# Patient Record
Sex: Female | Born: 1935 | Race: White | Hispanic: No | Smoking: Never smoker
Health system: Southern US, Community
[De-identification: ages and names within clinical notes are randomized; demographics above are authoritative.]

## PROBLEM LIST (undated history)

## (undated) DIAGNOSIS — C50919 Malignant neoplasm of unspecified site of unspecified female breast: Secondary | ICD-10-CM

## (undated) DIAGNOSIS — E781 Pure hyperglyceridemia: Secondary | ICD-10-CM

## (undated) DIAGNOSIS — E559 Vitamin D deficiency, unspecified: Secondary | ICD-10-CM

## (undated) DIAGNOSIS — D709 Neutropenia, unspecified: Secondary | ICD-10-CM

## (undated) DIAGNOSIS — C4491 Basal cell carcinoma of skin, unspecified: Secondary | ICD-10-CM

## (undated) DIAGNOSIS — I10 Essential (primary) hypertension: Secondary | ICD-10-CM

## (undated) DIAGNOSIS — N959 Unspecified menopausal and perimenopausal disorder: Secondary | ICD-10-CM

## (undated) DIAGNOSIS — C801 Malignant (primary) neoplasm, unspecified: Secondary | ICD-10-CM

## (undated) DIAGNOSIS — R202 Paresthesia of skin: Secondary | ICD-10-CM

## (undated) DIAGNOSIS — B354 Tinea corporis: Secondary | ICD-10-CM

## (undated) DIAGNOSIS — G4700 Insomnia, unspecified: Secondary | ICD-10-CM

## (undated) DIAGNOSIS — G629 Polyneuropathy, unspecified: Secondary | ICD-10-CM

## (undated) DIAGNOSIS — K649 Unspecified hemorrhoids: Secondary | ICD-10-CM

## (undated) HISTORY — DX: Malignant neoplasm of unspecified site of unspecified female breast: C50.919

## (undated) HISTORY — PX: EYE SURGERY: SHX253

## (undated) HISTORY — DX: Paresthesia of skin: R20.2

## (undated) HISTORY — DX: Unspecified hemorrhoids: K64.9

## (undated) HISTORY — DX: Polyneuropathy, unspecified: G62.9

## (undated) HISTORY — DX: Unspecified menopausal and perimenopausal disorder: N95.9

## (undated) HISTORY — DX: Tinea corporis: B35.4

## (undated) HISTORY — DX: Vitamin D deficiency, unspecified: E55.9

## (undated) HISTORY — DX: Pure hyperglyceridemia: E78.1

## (undated) HISTORY — DX: Essential (primary) hypertension: I10

## (undated) HISTORY — DX: Neutropenia, unspecified: D70.9

## (undated) HISTORY — DX: Basal cell carcinoma of skin, unspecified: C44.91

## (undated) HISTORY — DX: Insomnia, unspecified: G47.00

## (undated) HISTORY — PX: ELECTROCARDIOGRAM: SHX264

---

## 1975-12-20 DIAGNOSIS — C801 Malignant (primary) neoplasm, unspecified: Secondary | ICD-10-CM

## 1975-12-20 HISTORY — DX: Malignant (primary) neoplasm, unspecified: C80.1

## 1975-12-20 HISTORY — PX: ABDOMINAL HYSTERECTOMY: SHX81

## 1975-12-20 HISTORY — PX: BREAST BIOPSY: SHX20

## 1995-01-10 DIAGNOSIS — I10 Essential (primary) hypertension: Secondary | ICD-10-CM | POA: Insufficient documentation

## 2000-05-19 DIAGNOSIS — E781 Pure hyperglyceridemia: Secondary | ICD-10-CM | POA: Insufficient documentation

## 2000-05-23 DIAGNOSIS — G4700 Insomnia, unspecified: Secondary | ICD-10-CM | POA: Insufficient documentation

## 2004-10-28 ENCOUNTER — Ambulatory Visit: Payer: Self-pay | Admitting: Family Medicine

## 2005-12-19 HISTORY — PX: COLONOSCOPY: SHX174

## 2006-01-04 ENCOUNTER — Ambulatory Visit: Payer: Self-pay | Admitting: Family Medicine

## 2006-05-08 ENCOUNTER — Ambulatory Visit: Payer: Self-pay | Admitting: Unknown Physician Specialty

## 2006-05-08 LAB — HM COLONOSCOPY

## 2007-01-25 ENCOUNTER — Ambulatory Visit: Payer: Self-pay | Admitting: Family Medicine

## 2008-06-25 ENCOUNTER — Ambulatory Visit: Payer: Self-pay | Admitting: Family Medicine

## 2009-08-21 DIAGNOSIS — R945 Abnormal results of liver function studies: Secondary | ICD-10-CM | POA: Insufficient documentation

## 2009-08-21 DIAGNOSIS — R7989 Other specified abnormal findings of blood chemistry: Secondary | ICD-10-CM | POA: Insufficient documentation

## 2009-09-09 ENCOUNTER — Ambulatory Visit: Payer: Self-pay | Admitting: Family Medicine

## 2010-09-16 ENCOUNTER — Ambulatory Visit: Payer: Self-pay | Admitting: Family Medicine

## 2010-10-06 LAB — HM DEXA SCAN: HM Dexa Scan: NORMAL

## 2011-10-25 ENCOUNTER — Ambulatory Visit: Payer: Self-pay | Admitting: Family Medicine

## 2012-11-09 ENCOUNTER — Ambulatory Visit: Payer: Self-pay | Admitting: Family Medicine

## 2013-11-11 ENCOUNTER — Ambulatory Visit: Payer: Self-pay | Admitting: Family Medicine

## 2013-12-03 ENCOUNTER — Telehealth: Payer: Self-pay | Admitting: *Deleted

## 2013-12-03 NOTE — Telephone Encounter (Signed)
Referral to Dr Allyson Sabal at [facility].  Appt with Dr. Allyson Sabal on 12/05/2013 at 900am, pt is informed of appt and to arrive 30 minutes early with medication list, insurance cards.

## 2013-12-04 ENCOUNTER — Telehealth: Payer: Self-pay | Admitting: *Deleted

## 2013-12-04 MED ORDER — DOXYCYCLINE HYCLATE 100 MG PO TABS: 100.0000 mg | ORAL_TABLET | Freq: Two times a day (BID) | ORAL | Status: DC

## 2013-12-04 NOTE — Telephone Encounter (Signed)
Pt states she needs an refill on Doxycycline.  Dr Al Corpus ordered refill as previously.

## 2013-12-04 NOTE — Telephone Encounter (Signed)
Val you may order her refill on the doxy as from previous Doc. Thank you

## 2013-12-05 ENCOUNTER — Ambulatory Visit: Payer: Self-pay | Admitting: Cardiovascular Disease

## 2014-06-23 ENCOUNTER — Ambulatory Visit: Payer: Self-pay

## 2014-11-17 ENCOUNTER — Ambulatory Visit: Payer: Self-pay | Admitting: Family Medicine

## 2015-07-06 ENCOUNTER — Ambulatory Visit (INDEPENDENT_AMBULATORY_CARE_PROVIDER_SITE_OTHER): Payer: PPO | Admitting: Family Medicine

## 2015-07-06 ENCOUNTER — Encounter: Payer: Self-pay | Admitting: Family Medicine

## 2015-07-06 VITALS — BP 136/88 | HR 80 | Temp 98.2°F | Resp 16 | Wt 181.0 lb

## 2015-07-06 DIAGNOSIS — IMO0002 Reserved for concepts with insufficient information to code with codable children: Secondary | ICD-10-CM | POA: Insufficient documentation

## 2015-07-06 DIAGNOSIS — N952 Postmenopausal atrophic vaginitis: Secondary | ICD-10-CM | POA: Insufficient documentation

## 2015-07-06 DIAGNOSIS — G64 Other disorders of peripheral nervous system: Secondary | ICD-10-CM | POA: Insufficient documentation

## 2015-07-06 DIAGNOSIS — E559 Vitamin D deficiency, unspecified: Secondary | ICD-10-CM | POA: Insufficient documentation

## 2015-07-06 DIAGNOSIS — K649 Unspecified hemorrhoids: Secondary | ICD-10-CM | POA: Diagnosis not present

## 2015-07-06 DIAGNOSIS — D709 Neutropenia, unspecified: Secondary | ICD-10-CM | POA: Insufficient documentation

## 2015-07-06 DIAGNOSIS — E669 Obesity, unspecified: Secondary | ICD-10-CM | POA: Insufficient documentation

## 2015-07-06 MED ORDER — HYDROCORTISONE ACE-PRAMOXINE 2.5-1 % EX CREA: 1.0000 | TOPICAL_CREAM | Freq: Three times a day (TID) | CUTANEOUS | Status: DC

## 2015-07-06 NOTE — Progress Notes (Signed)
Subjective:    Patient ID: Kelly Clark, female    DOB: 01/26/1929, 81 y.o.   MRN: [id number]  HPI   Hemorrhoids: Patient complains of evaluation of possible hemorrhoids. Onset of symptoms was abrupt starting 4 weeks ago ago with stable course since that time.  She describes symptoms as painful defecation. Treatment to date has been Analpram. Patient denies family hx of colorectal CA, history of previous STDs, known or suspected STD exposure, maroon colored stools, melena, receptive anal intercourse and weight loss.  .hpi  Patient Active Problem List   Diagnosis Date Noted  . Adult BMI 30+ 07/06/2015  . Hemorrhoid 07/06/2015  . Neutropenia 07/06/2015  . Disorder of peripheral nervous system 07/06/2015  . Avitaminosis D 07/06/2015  . Vaginal atrophy 07/06/2015  . Abnormal LFTs 08/21/2009  . Cannot sleep 05/23/2000  . Hypertriglyceridemia 05/19/2000  . BP (high blood pressure) 01/10/1995   Family History  Problem Relation Age of Onset  . Heart attack Mother   . Congestive Heart Failure Mother   . Stroke Father   . Colon cancer Sister   . Throat cancer Maternal Grandmother    History   Social History  . Marital Status: Widowed    Spouse Name: N/A  . Number of Children: 1  . Years of Education: H/S   Occupational History  . Retired    Social History Main Topics  . Smoking status: Never Smoker   . Smokeless tobacco: Never Used  . Alcohol Use: No  . Drug Use: No  . Sexual Activity: Not on file   Other Topics Concern  . Not on file   Social History Narrative   Past Surgical History  Procedure Laterality Date  . Abdominal hysterectomy  1977  . Breast biopsy     Not on File Previous Medications   ALPHA-LIPOIC ACID 300 MG CAPS    Take by mouth.   AMLODIPINE (NORVASC) 5 MG TABLET    Take by mouth.   CLOBETASOL OINTMENT (TEMOVATE) 0.05 %    APPLY TO AFFECTED AREAS THREE TIMES WEEKLY (MONDAY'S, WEDNESDAY'S...  (REFER TO PRESCRIPTION NOTES).   COENZYME Q10 (CO Q  10) 60 MG CAPS       DOXYCYCLINE (VIBRA-TABS) 100 MG TABLET    Take 1 tablet (100 mg total) by mouth 2 (two) times daily.   GABAPENTIN (NEURONTIN) 600 MG TABLET    Take by mouth.   HYDROCHLOROTHIAZIDE (HYDRODIURIL) 12.5 MG TABLET    Take by mouth.   LATANOPROST (XALATAN) 0.005 % OPHTHALMIC SOLUTION       OMEGA-3 FATTY ACIDS (FISH OIL) 1000 MG CAPS       POLYETHYLENE GLYCOL POWDER (MIRALAX) POWDER    Take by mouth.   VITAMIN D, ERGOCALCIFEROL, (DRISDOL) 50000 UNITS CAPS CAPSULE    Take by mouth.   BP 136/88 mmHg  Pulse 80  Temp(Src) 98.2 F (36.8 C) (Oral)  Resp 16  Wt 181 lb (82.101 kg)   Review of Systems  Constitutional: Negative.   Respiratory: Negative.   Cardiovascular: Negative.   Gastrointestinal: Negative for diarrhea, constipation, blood in stool, abdominal distention, anal bleeding and rectal pain.  Genitourinary: Negative for decreased urine volume, vaginal bleeding, vaginal discharge, enuresis, difficulty urinating, vaginal pain, menstrual problem, pelvic pain and dyspareunia.  Musculoskeletal: Negative for back pain.       Objective:   Physical Exam  Constitutional: She is oriented to person, place, and time. She appears well-developed and well-nourished.  Genitourinary:  Does have some atrophic tissue between  her vagina and rectum. Also, large hemorrhoids noted.    Neurological: She is alert and oriented to person, place, and time.  Psychiatric: She has a normal mood and affect. Her behavior is normal. Judgment and thought content normal.   BP 136/88 mmHg  Pulse 80  Temp(Src) 98.2 F (36.8 C) (Oral)  Resp 16  Wt 181 lb (82.101 kg)        Assessment & Plan:   1. Hemorrhoids, unspecified hemorrhoid type Will treat with cream below and call if worsens or does not improve.  - Pramoxine-HC (HYDROCORTISONE ACE-PRAMOXINE) 2.5-1 % CREA; Apply 1 Dose topically 3 (three) times daily.  Dispense: 28.34 g; Refill: 3  2. Vaginal atrophy  Unclear what diagnosis  dermatologist was referring to, but suspect lichen sclerosis. Was given steroid cream. Was given reassurance that with surveillance it would not advance to terminal cancer.  Patient felt reassured. Will use cream and follow up with dermatology.   Margarita Rana, MD

## 2015-08-12 ENCOUNTER — Ambulatory Visit (HOSPITAL_COMMUNITY): Admit: 2015-08-12 | Payer: Self-pay | Admitting: Gastroenterology

## 2015-08-12 ENCOUNTER — Encounter (HOSPITAL_COMMUNITY): Payer: Self-pay

## 2015-08-12 SURGERY — ERCP, WITH INTERVENTION IF INDICATED
Anesthesia: General

## 2015-09-23 ENCOUNTER — Ambulatory Visit (INDEPENDENT_AMBULATORY_CARE_PROVIDER_SITE_OTHER): Payer: PPO | Admitting: Family Medicine

## 2015-09-23 ENCOUNTER — Encounter: Payer: Self-pay | Admitting: Family Medicine

## 2015-09-23 VITALS — BP 142/80 | HR 84 | Temp 98.1°F | Resp 16 | Ht 63.0 in | Wt 180.0 lb

## 2015-09-23 DIAGNOSIS — Z23 Encounter for immunization: Secondary | ICD-10-CM | POA: Diagnosis not present

## 2015-09-23 DIAGNOSIS — I10 Essential (primary) hypertension: Secondary | ICD-10-CM | POA: Diagnosis not present

## 2015-09-23 DIAGNOSIS — Z0000 Encounter for general adult medical examination without abnormal findings: Secondary | ICD-10-CM

## 2015-09-23 DIAGNOSIS — E781 Pure hyperglyceridemia: Secondary | ICD-10-CM

## 2015-09-23 DIAGNOSIS — N952 Postmenopausal atrophic vaginitis: Secondary | ICD-10-CM | POA: Diagnosis not present

## 2015-09-23 NOTE — Patient Instructions (Addendum)
Patient advised to stop scrubbing and continue using OTC Vaseline and prescribed cream. Patient advised to call if symptoms are worsening or not improving. Patient verbalizes understanding and is in agreement with treatment plan.  Please call the [place] at [facility] to schedule this at [phone]

## 2015-09-23 NOTE — Progress Notes (Signed)
Patient ID: Kelly Clark, female   DOB: 07-Jul-1929, 82 y.o.   MRN: [id number]        Patient: Kelly Clark, Female    DOB: [birth year], 81 y.o.   MRN: [id number] Visit Date: 09/23/2015  Today's Provider: Margarita Rana, MD   Chief Complaint  Patient presents with  . Medicare Wellness   Subjective:    Annual wellness visit Kelly Clark is a 81 y.o. female. She feels well. She reports exercising 3 times a week. She reports she is sleeping well. Pt C/O vaginal discomfort.  09/19/14 CPE 11/17/14 Mammo-BI-RADS 1 05/08/06 Colon-WNL 10/06/10 BMD-Normal  Vaginitis: Patient complains of an abnormal vaginal discharge for a few weeks. Vaginal symptoms include local irritation.Vulvar symptoms include pain.STI Risk: Very low risk of STD exposureDischarge described as: none.Other associated symptoms: local irritation.    -----------------------------------------------------------   Review of Systems  Constitutional: Negative.   HENT: Negative.   Eyes: Negative.   Respiratory: Negative.   Cardiovascular: Negative.   Gastrointestinal: Negative.   Endocrine: Negative.   Genitourinary: Positive for vaginal pain.  Musculoskeletal: Negative.   Skin: Negative.   Allergic/Immunologic: Negative.   Neurological: Negative.   Hematological: Negative.   Psychiatric/Behavioral: Negative.     Social History   Social History  . Marital Status: Widowed    Spouse Name: N/A  . Number of Children: 1  . Years of Education: H/S   Occupational History  . Retired    Social History Main Topics  . Smoking status: Never Smoker   . Smokeless tobacco: Never Used  . Alcohol Use: No  . Drug Use: No  . Sexual Activity: Not on file   Other Topics Concern  . Not on file   Social History Narrative    Patient Active Problem List   Diagnosis Date Noted  . Adult BMI 30+ 07/06/2015  . Hemorrhoid 07/06/2015  . Neutropenia ([place]) 07/06/2015  . Disorder of peripheral nervous system ([place])  07/06/2015  . Avitaminosis D 07/06/2015  . Vaginal atrophy 07/06/2015  . Abnormal LFTs 08/21/2009  . Cannot sleep 05/23/2000  . Hypertriglyceridemia 05/19/2000    Past Surgical History  Procedure Laterality Date  . Abdominal hysterectomy  1977  . Breast biopsy      Her family history includes Colon cancer in her sister; Congestive Heart Failure in her mother; Heart attack in her mother; Stroke in her father; Throat cancer in her maternal grandmother.    Previous Medications   ALPHA-LIPOIC ACID 300 MG CAPS    Take by mouth.   AMLODIPINE (NORVASC) 5 MG TABLET    Take 5 mg by mouth daily.    CHOLECALCIFEROL (VITAMIN D PO)    Take 1 capsule by mouth daily.   CLOBETASOL OINTMENT (TEMOVATE) 0.05 %    APPLY TO AFFECTED AREAS THREE TIMES WEEKLY (MONDAY'S, WEDNESDAY'S...  (REFER TO PRESCRIPTION NOTES).   COENZYME Q10 (CO Q 10) 60 MG CAPS    Take 1 capsule by mouth daily.    GABAPENTIN (NEURONTIN) 600 MG TABLET    Take 600 mg by mouth at bedtime.    HYDROCHLOROTHIAZIDE (HYDRODIURIL) 12.5 MG TABLET    Take 12.5 mg by mouth daily.    LATANOPROST (XALATAN) 0.005 % OPHTHALMIC SOLUTION    Place 1 drop into both eyes at bedtime.    MAGNESIUM (MAGNACAPS PO)    Take 1 tablet by mouth daily.   OMEGA-3 FATTY ACIDS (FISH OIL) 1000 MG CAPS    Take 1 capsule by mouth daily.  POLYETHYLENE GLYCOL POWDER (MIRALAX) POWDER    Take 1 Container by mouth daily.     Patient Care Team: Margarita Rana, MD as PCP - General (Family Medicine)     Objective:   Vitals: BP 142/80 mmHg  Pulse 84  Temp(Src) 98.1 F (36.7 C) (Oral)  Resp 16  Ht 5\' 3"  (1.6 m)  Wt 180 lb (81.647 kg)  BMI 31.89 kg/m2  SpO2 98%  Physical Exam  Constitutional: She is oriented to person, place, and time. She appears well-developed and well-nourished.  HENT:  Head: Normocephalic and atraumatic.  Right Ear: Tympanic membrane, external ear and ear canal normal.  Left Ear: Tympanic membrane, external ear and ear canal normal.  Nose:  Nose normal.  Mouth/Throat: Uvula is midline, oropharynx is clear and moist and mucous membranes are normal.  Eyes: Conjunctivae, EOM and lids are normal. Pupils are equal, round, and reactive to light.  Neck: Trachea normal and normal range of motion. Neck supple. Carotid bruit is not present. No thyroid mass and no thyromegaly present.  Cardiovascular: Normal rate, regular rhythm and normal heart sounds.   Pulmonary/Chest: Effort normal and breath sounds normal.  Abdominal: Soft. Normal appearance and bowel sounds are normal. There is no hepatosplenomegaly. There is no tenderness.  Genitourinary: No breast swelling, tenderness or discharge. There is erythema in the vagina.  Musculoskeletal: Normal range of motion.  Lymphadenopathy:    She has no cervical adenopathy.    She has no axillary adenopathy.  Neurological: She is alert and oriented to person, place, and time. She has normal strength. No cranial nerve deficit.  Skin: Skin is warm, dry and intact.  Psychiatric: She has a normal mood and affect. Her speech is normal and behavior is normal. Judgment and thought content normal. Cognition and memory are normal.    Activities of Daily Living In your present state of health, do you have any difficulty performing the following activities: 09/23/2015  Hearing? N  Vision? N  Difficulty concentrating or making decisions? N  Walking or climbing stairs? N  Dressing or bathing? N  Doing errands, shopping? N    Fall Risk Assessment Fall Risk  09/23/2015  Falls in the past year? No     Depression Screen PHQ 2/9 Scores 09/23/2015  PHQ - 2 Score 0    Cognitive Testing - 6-CIT  Correct? Score   What year is it? yes 0 0 or 4  What month is it? yes 0 0 or 3  Memorize:    Kelly Clark,  44,  [address]  [place],      What time is it? (within 1 hour) yes 0 0 or 3  Count backwards from 20 yes 0 0, 2, or 4  Name the months of the year yes 0 0, 2, or 4  Repeat name & address above yes 0 0,  2, 4, 6, 8, or 10       TOTAL SCORE  0/28   Interpretation:  Normal  Normal (0-7) Abnormal (8-28)       Assessment & Plan:     Annual Wellness Visit  Reviewed patient's Family Medical History Reviewed and updated list of patient's medical providers Assessment of cognitive impairment was done Assessed patient's functional ability Established a written schedule for health screening [place] Completed and Reviewed  Exercise Activities and Dietary recommendations Goals    . Exercise 150 minutes per week (moderate activity)       Immunization History  Administered Date(s)  Administered  . Influenza, High Dose Seasonal PF 09/23/2015  . Pneumococcal Conjugate-13 08/01/2014  . Pneumococcal Polysaccharide-23 06/03/1999  . Td 01/18/2007    Health Maintenance  Topic Date Due  . ZOSTAVAX  05/05/1991  . DEXA SCAN  05/04/1996  . INFLUENZA VACCINE  07/20/2015  . TETANUS/TDAP  01/18/2017  . PNA vac Low Risk Adult  Completed       1. Medicare annual wellness visit, subsequent Stable. Patient advised to continue eating healthy and exercise daily.  2. Need for influenza vaccination - Flu vaccine HIGH DOSE PF  3. Hypertriglyceridemia - Lipid Panel With LDL/HDL Ratio  4. Essential hypertension, malignant - CBC with Differential/Platelet - Comprehensive metabolic panel - TSH  5. Vaginal atrophy New problem. Patient advised to stop scrubbing. Patient advised to continue using Clobetasol ointment along with Vaseline. Patient advised to call if symptoms are worsening or not improving. Patient verbalizes understanding and is in agreement with treatment plan.     Patient seen and examined by Dr. Jerrell Belfast, and note scribed by Philbert Riser. Dimas, CMA.  I have reviewed the document for accuracy and completeness and I agree with above. Jerrell Belfast, MD   Margarita Rana, MD      ------------------------------------------------------------------------------------------------------------

## 2015-09-26 LAB — COMPREHENSIVE METABOLIC PANEL
ALT: 24 IU/L (ref 0–32)
AST: 23 IU/L (ref 0–40)
Albumin/Globulin Ratio: 1.6 (ref 1.1–2.5)
Albumin: 4.1 g/dL (ref 3.5–4.7)
Alkaline Phosphatase: 51 IU/L (ref 39–117)
BUN/Creatinine Ratio: 11 (ref 11–26)
BUN: 12 mg/dL (ref 8–27)
Bilirubin Total: 0.5 mg/dL (ref 0.0–1.2)
CO2: 28 mmol/L (ref 18–29)
Calcium: 9.8 mg/dL (ref 8.7–10.3)
Chloride: 103 mmol/L (ref 97–108)
Creatinine, Ser: 1.05 mg/dL — ABNORMAL HIGH (ref 0.57–1.00)
GFR calc Af Amer: 56 mL/min/{1.73_m2} — ABNORMAL LOW (ref >59)
GFR calc non Af Amer: 49 mL/min/{1.73_m2} — ABNORMAL LOW (ref >59)
Globulin, Total: 2.6 g/dL (ref 1.5–4.5)
Glucose: 97 mg/dL (ref 65–99)
Potassium: 5.2 mmol/L (ref 3.5–5.2)
Sodium: 146 mmol/L — ABNORMAL HIGH (ref 134–144)
Total Protein: 6.7 g/dL (ref 6.0–8.5)

## 2015-09-26 LAB — CBC WITH DIFFERENTIAL/PLATELET
Basophils Absolute: 0 10*3/uL (ref 0.0–0.2)
Basos: 0 %
EOS (ABSOLUTE): 0.2 10*3/uL (ref 0.0–0.4)
Eos: 4 %
Hematocrit: 41.8 % (ref 34.0–46.6)
Hemoglobin: 14.2 g/dL (ref 11.1–15.9)
Immature Grans (Abs): 0 10*3/uL (ref 0.0–0.1)
Immature Granulocytes: 0 %
Lymphocytes Absolute: 1.5 10*3/uL (ref 0.7–3.1)
Lymphs: 35 %
MCH: 31.7 pg (ref 26.6–33.0)
MCHC: 34 g/dL (ref 31.5–35.7)
MCV: 93 fL (ref 79–97)
Monocytes Absolute: 0.4 10*3/uL (ref 0.1–0.9)
Monocytes: 9 %
Neutrophils Absolute: 2.2 10*3/uL (ref 1.4–7.0)
Neutrophils: 52 %
Platelets: 182 10*3/uL (ref 150–379)
RBC: 4.48 x10E6/uL (ref 3.77–5.28)
RDW: 15.1 % (ref 12.3–15.4)
WBC: 4.2 10*3/uL (ref 3.4–10.8)

## 2015-09-26 LAB — LIPID PANEL WITH LDL/HDL RATIO
Cholesterol, Total: 172 mg/dL (ref 100–199)
HDL: 45 mg/dL (ref >39)
LDL Calculated: 80 mg/dL (ref 0–99)
LDl/HDL Ratio: 1.8 ratio units (ref 0.0–3.2)
Triglycerides: 236 mg/dL — ABNORMAL HIGH (ref 0–149)
VLDL Cholesterol Cal: 47 mg/dL — ABNORMAL HIGH (ref 5–40)

## 2015-09-26 LAB — TSH: TSH: 2.92 u[IU]/mL (ref 0.450–4.500)

## 2015-09-28 ENCOUNTER — Telehealth: Payer: Self-pay

## 2015-09-28 NOTE — Telephone Encounter (Signed)
Pt advised.   Thanks,   -Laura  

## 2015-09-28 NOTE — Telephone Encounter (Signed)
-----   Message from Margarita Rana, MD sent at 09/26/2015  7:56 AM EDT ----- Labs stable. Please notify patient. Thanks.

## 2015-10-26 ENCOUNTER — Other Ambulatory Visit: Payer: Self-pay | Admitting: Family Medicine

## 2015-10-26 DIAGNOSIS — Z1231 Encounter for screening mammogram for malignant neoplasm of breast: Secondary | ICD-10-CM

## 2015-11-19 ENCOUNTER — Ambulatory Visit
Admission: RE | Admit: 2015-11-19 | Discharge: 2015-11-19 | Disposition: A | Discharge status: Home / Self Care | Payer: PPO | Source: Ambulatory Visit | Attending: Family Medicine | Admitting: Family Medicine

## 2015-11-19 DIAGNOSIS — Z1231 Encounter for screening mammogram for malignant neoplasm of breast: Secondary | ICD-10-CM | POA: Insufficient documentation

## 2016-01-01 DIAGNOSIS — L900 Lichen sclerosus et atrophicus: Secondary | ICD-10-CM | POA: Diagnosis not present

## 2016-01-27 DIAGNOSIS — H401132 Primary open-angle glaucoma, bilateral, moderate stage: Secondary | ICD-10-CM | POA: Diagnosis not present

## 2016-05-24 DIAGNOSIS — H40153 Residual stage of open-angle glaucoma, bilateral: Secondary | ICD-10-CM | POA: Diagnosis not present

## 2016-05-30 ENCOUNTER — Other Ambulatory Visit: Payer: Self-pay | Admitting: Family Medicine

## 2016-05-30 DIAGNOSIS — G64 Other disorders of peripheral nervous system: Secondary | ICD-10-CM

## 2016-05-30 NOTE — Telephone Encounter (Signed)
Has wellness scheduled with Dr. Rosanna Randy 10/05/2016. Renaldo Fiddler, CMA

## 2016-06-15 ENCOUNTER — Other Ambulatory Visit: Payer: Self-pay | Admitting: Family Medicine

## 2016-06-23 DIAGNOSIS — L821 Other seborrheic keratosis: Secondary | ICD-10-CM | POA: Diagnosis not present

## 2016-06-23 DIAGNOSIS — Z85828 Personal history of other malignant neoplasm of skin: Secondary | ICD-10-CM | POA: Diagnosis not present

## 2016-06-23 DIAGNOSIS — Z08 Encounter for follow-up examination after completed treatment for malignant neoplasm: Secondary | ICD-10-CM | POA: Diagnosis not present

## 2016-06-23 DIAGNOSIS — L900 Lichen sclerosus et atrophicus: Secondary | ICD-10-CM | POA: Diagnosis not present

## 2016-09-12 DIAGNOSIS — S99922A Unspecified injury of left foot, initial encounter: Secondary | ICD-10-CM | POA: Diagnosis not present

## 2016-09-12 DIAGNOSIS — S92352A Displaced fracture of fifth metatarsal bone, left foot, initial encounter for closed fracture: Secondary | ICD-10-CM | POA: Diagnosis not present

## 2016-09-12 DIAGNOSIS — S92302A Fracture of unspecified metatarsal bone(s), left foot, initial encounter for closed fracture: Secondary | ICD-10-CM | POA: Diagnosis not present

## 2016-09-20 DIAGNOSIS — H40153 Residual stage of open-angle glaucoma, bilateral: Secondary | ICD-10-CM | POA: Diagnosis not present

## 2016-09-21 DIAGNOSIS — S92342A Displaced fracture of fourth metatarsal bone, left foot, initial encounter for closed fracture: Secondary | ICD-10-CM | POA: Diagnosis not present

## 2016-09-21 DIAGNOSIS — M79672 Pain in left foot: Secondary | ICD-10-CM | POA: Diagnosis not present

## 2016-09-21 DIAGNOSIS — S92352A Displaced fracture of fifth metatarsal bone, left foot, initial encounter for closed fracture: Secondary | ICD-10-CM | POA: Diagnosis not present

## 2016-09-23 ENCOUNTER — Encounter: Payer: PPO | Admitting: Family Medicine

## 2016-10-05 ENCOUNTER — Encounter: Payer: Self-pay | Admitting: Family Medicine

## 2016-10-05 ENCOUNTER — Other Ambulatory Visit: Payer: Self-pay | Admitting: Family Medicine

## 2016-10-05 ENCOUNTER — Ambulatory Visit (INDEPENDENT_AMBULATORY_CARE_PROVIDER_SITE_OTHER): Payer: PPO | Admitting: Family Medicine

## 2016-10-05 VITALS — BP 112/76 | HR 80 | Temp 98.5°F | Resp 14 | Ht 63.0 in | Wt 180.0 lb

## 2016-10-05 DIAGNOSIS — R739 Hyperglycemia, unspecified: Secondary | ICD-10-CM

## 2016-10-05 DIAGNOSIS — I10 Essential (primary) hypertension: Secondary | ICD-10-CM

## 2016-10-05 DIAGNOSIS — Z1231 Encounter for screening mammogram for malignant neoplasm of breast: Secondary | ICD-10-CM

## 2016-10-05 DIAGNOSIS — Z23 Encounter for immunization: Secondary | ICD-10-CM | POA: Diagnosis not present

## 2016-10-05 DIAGNOSIS — R42 Dizziness and giddiness: Secondary | ICD-10-CM | POA: Diagnosis not present

## 2016-10-05 DIAGNOSIS — Z0000 Encounter for general adult medical examination without abnormal findings: Secondary | ICD-10-CM

## 2016-10-05 NOTE — Progress Notes (Addendum)
Patient: Kelly Clark, Female    DOB: 05-Oct-1929, 81 y.o.   MRN: [id number] Visit Date: 10/05/2016  Today's Provider: Wilhemena Durie, MD   Chief Complaint  Patient presents with  . Medicare Wellness   Subjective:   Kelly Clark is a 82 y.o. female who presents today for her Subsequent Annual Wellness Visit. She feels well. She reports exercising not right now. She reports she is sleeping well. Immunization History  Administered Date(s) Administered  . Influenza, High Dose Seasonal PF 09/23/2015  . Pneumococcal Conjugate-13 08/01/2014  . Pneumococcal Polysaccharide-23 06/03/1999  . Td 01/18/2007   Last Mammogram 11/19/15  BMD 10/06/10 normal  Colonoscopy 05/08/06  Review of Systems  Constitutional: Negative.   HENT: Negative.   Eyes: Negative.   Respiratory: Negative.   Cardiovascular: Negative.   Gastrointestinal: Negative.   Endocrine: Negative.   Genitourinary: Negative.   Musculoskeletal: Positive for arthralgias and gait problem.  Skin: Negative.   Allergic/Immunologic: Negative.   Neurological: Positive for dizziness and light-headedness.  Hematological: Negative.   Psychiatric/Behavioral: Negative.     Patient Active Problem List   Diagnosis Date Noted  . Adult BMI 30+ 07/06/2015  . Hemorrhoid 07/06/2015  . Neutropenia ([place]) 07/06/2015  . Disorder of peripheral nervous system ([place]) 07/06/2015  . Avitaminosis D 07/06/2015  . Vaginal atrophy 07/06/2015  . Abnormal LFTs 08/21/2009  . Cannot sleep 05/23/2000  . Hypertriglyceridemia 05/19/2000    Social History   Social History  . Marital status: Widowed    Spouse name: N/A  . Number of children: 1  . Years of education: H/S   Occupational History  . Retired    Social History Main Topics  . Smoking status: Never Smoker  . Smokeless tobacco: Never Used  . Alcohol use No  . Drug use: No  . Sexual activity: Not on file   Other Topics Concern  . Not on file   Social History Narrative  . No  narrative on file    Past Surgical History:  Procedure Laterality Date  . ABDOMINAL HYSTERECTOMY  1977  . BREAST BIOPSY Bilateral    benign    Her family history includes Colon cancer in her sister; Congestive Heart Failure in her mother; Heart attack in her mother; Stroke in her father; Throat cancer in her maternal grandmother.    Outpatient Encounter Prescriptions as of 10/05/2016  Medication Sig Note  . amLODipine (NORVASC) 5 MG tablet take 1 tablet by mouth once daily   . Cholecalciferol (VITAMIN D PO) Take 1 capsule by mouth daily.   . clobetasol ointment (TEMOVATE) 0.05 % APPLY TO AFFECTED AREAS THREE TIMES WEEKLY (MONDAY'S, WEDNESDAY'S...  (REFER TO PRESCRIPTION NOTES). 07/06/2015: Received from: External Pharmacy  . Coenzyme Q10 (CO Q 10) 60 MG CAPS Take 1 capsule by mouth daily.  07/06/2015: DX: 272.1 Received from: [facility]  . gabapentin (NEURONTIN) 600 MG tablet take 1 tablet by mouth at bedtime   . hydrochlorothiazide (HYDRODIURIL) 12.5 MG tablet take 1 tablet by mouth once daily   . latanoprost (XALATAN) 0.005 % ophthalmic solution Place 1 drop into both eyes at bedtime.  07/06/2015: Received from: External Pharmacy  . Multiple Vitamin (MULTIVITAMIN) tablet Take 1 tablet by mouth daily.   . Omega-3 Fatty Acids (FISH OIL) 1000 MG CAPS Take 1 capsule by mouth daily.  07/06/2015: DX: 272.1 Received from: [facility]  . polyethylene glycol powder (MIRALAX) powder Take 1 Container by mouth daily.  07/06/2015: DX: 564.00 Received from: [facility]  . [DISCONTINUED] Alpha-Lipoic Acid 300 MG CAPS Take by mouth. 07/06/2015: Received from: [facility]  . [DISCONTINUED] gabapentin (NEURONTIN) 600 MG tablet take 1 tablet by mouth at bedtime   . [DISCONTINUED] Magnesium (MAGNACAPS PO) Take 1 tablet by mouth daily.    No facility-administered encounter medications on file as of 10/05/2016.     No Known  Allergies  Patient Care Team: [name]., MD as PCP - General (Family Medicine)  Objective:   Vitals:  Vitals:   10/05/16 1045  BP: 112/76  Pulse: 80  Resp: 14  Temp: 98.5 F (36.9 C)  Weight: 180 lb (81.6 kg)  Height: 5\' 3"  (1.6 m)    Physical Exam  Constitutional: She is oriented to person, place, and time. She appears well-developed and well-nourished.  HENT:  Head: Normocephalic and atraumatic.  Right Ear: External ear normal.  Left Ear: External ear normal.  Nose: Nose normal.  Mouth/Throat: Oropharynx is clear and moist.  Small right thyroid nodule.  Eyes: Conjunctivae are normal. Pupils are equal, round, and reactive to light.  Neck: Normal range of motion. Neck supple.  Cardiovascular: Normal rate, regular rhythm, normal heart sounds and intact distal pulses.   No murmur heard. Pulmonary/Chest: Effort normal and breath sounds normal. No respiratory distress. She has no wheezes.  Abdominal: Soft. She exhibits no distension. There is no tenderness. There is no rebound.  Musculoskeletal:  Wears a boot on left foot post fractures  Neurological: She is alert and oriented to person, place, and time. No cranial nerve deficit. Coordination normal.  Skin: Skin is warm and dry.  Psychiatric: She has a normal mood and affect. Her behavior is normal. Judgment and thought content normal.    Activities of Daily Living In your present state of health, do you have any difficulty performing the following activities: 10/05/2016  Hearing? N  Vision? N  Difficulty concentrating or making decisions? N  Walking or climbing stairs? N  Dressing or bathing? N  Doing errands, shopping? N  Some recent data might be hidden    Fall Risk Assessment Fall Risk  10/05/2016 09/23/2015  Falls in the past year? Yes No  Number falls in past yr: 1 -  Injury with Fall? Yes -     Depression Screen PHQ 2/9 Scores 10/05/2016 09/23/2015  PHQ - 2 Score 0 0    Cognitive Testing -  6-CIT    Year: 0 4 points  Month: 0 3 points  Memorize "[name], [address] [place]"  Time (within 1 hour:) 0 3 points  Count backwards from 20: 0 2 4 points  Name months of year: 0 2 4 points  Repeat Address: 0 2 4 6 8 10  points   Total Score: 0/28  Interpretation : Normal (0-7) Abnormal (8-28)    Assessment & Plan:     Annual Wellness Visit  Reviewed patient's Family Medical History Reviewed and updated list of patient's medical providers Assessment of cognitive impairment was done Assessed patient's functional ability Established a written schedule for health screening [place] Completed and Reviewed  1. Medicare annual wellness visit, subsequent  2. Need for influenza vaccination - Flu vaccine HIGH DOSE PF (Fluzone High dose)  3. Lightheadedness EKG stable. Will check labs for underling issues. Pending results.Vague symptoms and very nonspecific complaint. Mild orthostasis very likely. Encourage patient to increase fluids. - EKG 12-Lead - CBC w/Diff/Platelet  4. Essential hypertension, malignant Stable. - CBC w/Diff/Platelet - TSH -  Comprehensive metabolic panel  5. Hyperglycemia Glucose level elevated on the last check.  - HgB A1c 6. Small thyroid nodule Follow clinically for now. Consider endocrine or ultrasound. HPI, Exam and A&P transcribed under direction and in the presence of Miguel Aschoff, MD. I have done the exam and reviewed the chart and it is accurate to the best of my knowledge. Miguel Aschoff M.D. [facility]

## 2016-10-06 DIAGNOSIS — M79672 Pain in left foot: Secondary | ICD-10-CM | POA: Diagnosis not present

## 2016-10-06 DIAGNOSIS — S92342D Displaced fracture of fourth metatarsal bone, left foot, subsequent encounter for fracture with routine healing: Secondary | ICD-10-CM | POA: Diagnosis not present

## 2016-10-06 DIAGNOSIS — S92352D Displaced fracture of fifth metatarsal bone, left foot, subsequent encounter for fracture with routine healing: Secondary | ICD-10-CM | POA: Diagnosis not present

## 2016-10-06 LAB — COMPREHENSIVE METABOLIC PANEL
ALT: 18 IU/L (ref 0–32)
AST: 18 IU/L (ref 0–40)
Albumin/Globulin Ratio: 1.9 (ref 1.2–2.2)
Albumin: 4.3 g/dL (ref 3.5–4.7)
Alkaline Phosphatase: 56 IU/L (ref 39–117)
BUN/Creatinine Ratio: 15 (ref 12–28)
BUN: 16 mg/dL (ref 8–27)
Bilirubin Total: 0.4 mg/dL (ref 0.0–1.2)
CO2: 29 mmol/L (ref 18–29)
Calcium: 9.7 mg/dL (ref 8.7–10.3)
Chloride: 97 mmol/L (ref 96–106)
Creatinine, Ser: 1.07 mg/dL — ABNORMAL HIGH (ref 0.57–1.00)
GFR calc Af Amer: 55 mL/min/{1.73_m2} — ABNORMAL LOW (ref >59)
GFR calc non Af Amer: 47 mL/min/{1.73_m2} — ABNORMAL LOW (ref >59)
Globulin, Total: 2.3 g/dL (ref 1.5–4.5)
Glucose: 81 mg/dL (ref 65–99)
Potassium: 4.1 mmol/L (ref 3.5–5.2)
Sodium: 141 mmol/L (ref 134–144)
Total Protein: 6.6 g/dL (ref 6.0–8.5)

## 2016-10-06 LAB — CBC WITH DIFFERENTIAL/PLATELET
Basophils Absolute: 0 10*3/uL (ref 0.0–0.2)
Basos: 0 %
EOS (ABSOLUTE): 0.2 10*3/uL (ref 0.0–0.4)
Eos: 3 %
Hematocrit: 41.4 % (ref 34.0–46.6)
Hemoglobin: 13.7 g/dL (ref 11.1–15.9)
Immature Grans (Abs): 0 10*3/uL (ref 0.0–0.1)
Immature Granulocytes: 0 %
Lymphocytes Absolute: 1.6 10*3/uL (ref 0.7–3.1)
Lymphs: 26 %
MCH: 31.3 pg (ref 26.6–33.0)
MCHC: 33.1 g/dL (ref 31.5–35.7)
MCV: 95 fL (ref 79–97)
Monocytes Absolute: 0.5 10*3/uL (ref 0.1–0.9)
Monocytes: 9 %
Neutrophils Absolute: 3.9 10*3/uL (ref 1.4–7.0)
Neutrophils: 62 %
Platelets: 188 10*3/uL (ref 150–379)
RBC: 4.38 x10E6/uL (ref 3.77–5.28)
RDW: 15.2 % (ref 12.3–15.4)
WBC: 6.3 10*3/uL (ref 3.4–10.8)

## 2016-10-06 LAB — HEMOGLOBIN A1C
Est. average glucose Bld gHb Est-mCnc: 111 mg/dL
Hgb A1c MFr Bld: 5.5 % (ref 4.8–5.6)

## 2016-10-06 LAB — TSH: TSH: 1.76 u[IU]/mL (ref 0.450–4.500)

## 2016-10-26 DIAGNOSIS — S92352D Displaced fracture of fifth metatarsal bone, left foot, subsequent encounter for fracture with routine healing: Secondary | ICD-10-CM | POA: Diagnosis not present

## 2016-10-26 DIAGNOSIS — S92342D Displaced fracture of fourth metatarsal bone, left foot, subsequent encounter for fracture with routine healing: Secondary | ICD-10-CM | POA: Diagnosis not present

## 2016-11-02 DIAGNOSIS — H8111 Benign paroxysmal vertigo, right ear: Secondary | ICD-10-CM | POA: Diagnosis not present

## 2016-11-02 DIAGNOSIS — R42 Dizziness and giddiness: Secondary | ICD-10-CM | POA: Diagnosis not present

## 2016-11-02 DIAGNOSIS — H6121 Impacted cerumen, right ear: Secondary | ICD-10-CM | POA: Diagnosis not present

## 2016-11-22 ENCOUNTER — Ambulatory Visit
Admission: RE | Admit: 2016-11-22 | Discharge: 2016-11-22 | Disposition: A | Discharge status: Home / Self Care | Payer: PPO | Source: Ambulatory Visit | Attending: Family Medicine | Admitting: Family Medicine

## 2016-11-22 DIAGNOSIS — Z1231 Encounter for screening mammogram for malignant neoplasm of breast: Secondary | ICD-10-CM | POA: Diagnosis not present

## 2016-11-22 HISTORY — DX: Malignant (primary) neoplasm, unspecified: C80.1

## 2016-11-29 DIAGNOSIS — S92342D Displaced fracture of fourth metatarsal bone, left foot, subsequent encounter for fracture with routine healing: Secondary | ICD-10-CM | POA: Diagnosis not present

## 2016-11-29 DIAGNOSIS — S92352D Displaced fracture of fifth metatarsal bone, left foot, subsequent encounter for fracture with routine healing: Secondary | ICD-10-CM | POA: Diagnosis not present

## 2016-12-28 DIAGNOSIS — L900 Lichen sclerosus et atrophicus: Secondary | ICD-10-CM | POA: Diagnosis not present

## 2017-01-30 DIAGNOSIS — H40153 Residual stage of open-angle glaucoma, bilateral: Secondary | ICD-10-CM | POA: Diagnosis not present

## 2017-03-22 ENCOUNTER — Other Ambulatory Visit: Payer: Self-pay | Admitting: Family Medicine

## 2017-03-22 NOTE — Telephone Encounter (Signed)
Last filled 06/15/2016 qty 90 with 1 refill

## 2017-03-22 NOTE — Telephone Encounter (Signed)
[facility] faxed a request for the following medications to be filled for pt. 90-day supply. Thanks CC   amLODipine (NORVASC) 5 MG  Take 1 tablet by mouth once daily Qty 90  Hydrochlorothiazide 12.5 MG Take 1 tablet by mouth once daily Qty 90

## 2017-03-23 MED ORDER — AMLODIPINE BESYLATE 5 MG PO TABS: 5.0000 mg | ORAL_TABLET | Freq: Every day | ORAL | 1 refills | Status: DC

## 2017-03-23 MED ORDER — HYDROCHLOROTHIAZIDE 12.5 MG PO TABS: 12.5000 mg | ORAL_TABLET | Freq: Every day | ORAL | 1 refills | Status: DC

## 2017-04-26 DIAGNOSIS — H8112 Benign paroxysmal vertigo, left ear: Secondary | ICD-10-CM | POA: Diagnosis not present

## 2017-05-30 DIAGNOSIS — H40153 Residual stage of open-angle glaucoma, bilateral: Secondary | ICD-10-CM | POA: Diagnosis not present

## 2017-06-13 DIAGNOSIS — H26491 Other secondary cataract, right eye: Secondary | ICD-10-CM | POA: Diagnosis not present

## 2017-06-13 DIAGNOSIS — Z961 Presence of intraocular lens: Secondary | ICD-10-CM | POA: Diagnosis not present

## 2017-06-13 DIAGNOSIS — H18413 Arcus senilis, bilateral: Secondary | ICD-10-CM | POA: Diagnosis not present

## 2017-06-13 DIAGNOSIS — H26493 Other secondary cataract, bilateral: Secondary | ICD-10-CM | POA: Diagnosis not present

## 2017-06-28 DIAGNOSIS — L821 Other seborrheic keratosis: Secondary | ICD-10-CM | POA: Diagnosis not present

## 2017-06-28 DIAGNOSIS — L900 Lichen sclerosus et atrophicus: Secondary | ICD-10-CM | POA: Diagnosis not present

## 2017-06-28 DIAGNOSIS — Z08 Encounter for follow-up examination after completed treatment for malignant neoplasm: Secondary | ICD-10-CM | POA: Diagnosis not present

## 2017-06-28 DIAGNOSIS — Z85828 Personal history of other malignant neoplasm of skin: Secondary | ICD-10-CM | POA: Diagnosis not present

## 2017-06-30 ENCOUNTER — Other Ambulatory Visit: Payer: Self-pay | Admitting: Family Medicine

## 2017-06-30 MED ORDER — GABAPENTIN 600 MG PO TABS: 600.0000 mg | ORAL_TABLET | Freq: Every day | ORAL | 1 refills | Status: DC

## 2017-06-30 NOTE — Telephone Encounter (Signed)
[facility] faxed a request on the following medication. Thanks CC  gabapentin (NEURONTIN) 600 MG tablet  >Take 1 tablet by mouth at bedtime.

## 2017-07-03 ENCOUNTER — Telehealth: Payer: Self-pay | Admitting: Family Medicine

## 2017-07-06 ENCOUNTER — Ambulatory Visit (INDEPENDENT_AMBULATORY_CARE_PROVIDER_SITE_OTHER): Payer: PPO

## 2017-07-06 VITALS — BP 144/76 | HR 80 | Temp 98.9°F | Ht 62.5 in | Wt 185.0 lb

## 2017-07-06 DIAGNOSIS — Z0000 Encounter for general adult medical examination without abnormal findings: Secondary | ICD-10-CM | POA: Diagnosis not present

## 2017-07-06 NOTE — Progress Notes (Signed)
Subjective:   Kelly Clark is a 83 y.o. female who presents for Medicare Annual (Subsequent) preventive examination.  Review of Systems:  N/A  Cardiac Risk Factors include: advanced age (>91men, >81 women);hypertension;obesity (BMI >30kg/m2)     Objective:     Vitals: BP (!) 144/76 (BP Location: Left Arm)   Pulse 80   Temp 98.9 F (37.2 C) (Oral)   Ht 5' 2.5" (1.588 m)   Wt 185 lb (83.9 kg)   BMI 33.30 kg/m   Body mass index is 33.3 kg/m.   Tobacco History  Smoking Status  . Never Smoker  Smokeless Tobacco  . Never Used     Counseling given: Not Answered   Past Medical History:  Diagnosis Date  . Cancer ([place])    melanoma   Past Surgical History:  Procedure Laterality Date  . ABDOMINAL HYSTERECTOMY  1977  . BREAST BIOPSY Bilateral 1977   benign   Family History  Problem Relation Age of Onset  . Heart attack Mother   . Congestive Heart Failure Mother   . Stroke Father   . Colon cancer Sister   . Throat cancer Maternal Grandmother   . Healthy Daughter    History  Sexual Activity  . Sexual activity: Not on file    Outpatient Encounter Prescriptions as of 07/06/2017  Medication Sig  . amLODipine (NORVASC) 5 MG tablet Take 1 tablet (5 mg total) by mouth daily.  . Cholecalciferol (VITAMIN D PO) Take 1 capsule by mouth daily.  . clobetasol ointment (TEMOVATE) 0.05 % APPLY TO AFFECTED AREAS ONCE WEEKLY  . Coenzyme Q10 (CO Q 10) 60 MG CAPS Take 1 capsule by mouth daily.   Marland Kitchen gabapentin (NEURONTIN) 600 MG tablet Take 1 tablet (600 mg total) by mouth at bedtime.  . hydrochlorothiazide (HYDRODIURIL) 12.5 MG tablet Take 1 tablet (12.5 mg total) by mouth daily.  Marland Kitchen latanoprost (XALATAN) 0.005 % ophthalmic solution Place 1 drop into both eyes at bedtime.   . Multiple Vitamin (MULTIVITAMIN) tablet Take 1 tablet by mouth daily.  . multivitamin-lutein (OCUVITE-LUTEIN) CAPS capsule Take 1 capsule by mouth daily.  . polyethylene glycol powder (MIRALAX) powder Take 1  Container by mouth daily.   . [DISCONTINUED] Omega-3 Fatty Acids (FISH OIL) 1000 MG CAPS Take 1 capsule by mouth daily.    No facility-administered encounter medications on file as of 07/06/2017.     Activities of Daily Living In your present state of health, do you have any difficulty performing the following activities: 07/06/2017 10/05/2016  Hearing? N N  Vision? N N  Difficulty concentrating or making decisions? N N  Walking or climbing stairs? N N  Dressing or bathing? N N  Doing errands, shopping? N N  Preparing Food and eating ? N -  Using the Toilet? N -  In the past six months, have you accidently leaked urine? Y -  Do you have problems with loss of bowel control? N -  Managing your Medications? N -  Managing your Finances? N -  Housekeeping or managing your Housekeeping? N -  Some recent data might be hidden    Patient Care Team: [name]., MD as PCP - General (Family Medicine) Lorelee Cover., MD as Consulting Physician (Ophthalmology) Oneta Rack, MD as Consulting Physician (Dermatology)    Assessment:     Exercise Activities and Dietary recommendations Current Exercise Habits: Structured exercise class, Type of exercise: stretching;strength training/weights;Other - see comments (cardio), Time (Minutes): 60, Frequency (Times/Week): 3,  Weekly Exercise (Minutes/Week): 180, Intensity: Mild, Exercise limited by: None identified  Goals    . Exercise 150 minutes per week (moderate activity)    . Increase water intake          Recommend increasing water intake to 4-6 glasses a day.       Fall Risk Fall Risk  07/06/2017 10/05/2016 09/23/2015  Falls in the past year? No Yes No  Number falls in past yr: - 1 -  Injury with Fall? - Yes -   Depression Screen PHQ 2/9 Scores 07/06/2017 10/05/2016 09/23/2015  PHQ - 2 Score 0 0 0     Cognitive Function     6CIT Screen 07/06/2017 10/05/2016  What Year? 0 points 0 points  What month? 0 points 0 points    What time? 0 points 0 points  Count back from 20 0 points 0 points  Months in reverse 0 points 0 points  Repeat phrase 0 points 0 points  Total Score 0 0    Immunization History  Administered Date(s) Administered  . Influenza, High Dose Seasonal PF 09/23/2015, 10/05/2016  . Pneumococcal Conjugate-13 08/01/2014  . Pneumococcal Polysaccharide-23 06/03/1999  . Td 01/18/2007   Screening Tests Health Maintenance  Topic Date Due  . TETANUS/TDAP  12/19/2026 (Originally 01/18/2017)  . INFLUENZA VACCINE  07/19/2017  . DEXA SCAN  Completed  . PNA vac Low Risk Adult  Completed      Plan:  I have personally reviewed and addressed the Medicare Annual Wellness questionnaire and have noted the following in the patient's chart:  A. Medical and social history B. Use of alcohol, tobacco or illicit drugs  C. Current medications and supplements D. Functional ability and status E.  Nutritional status F.  Physical activity G. Advance directives H. List of other physicians I.  Hospitalizations, surgeries, and ER visits in previous 12 months J.  [place] such as hearing and vision if needed, cognitive and depression L. Referrals and appointments - none  In addition, I have reviewed and discussed with patient certain preventive protocols, quality metrics, and best practice recommendations. A written personalized care plan for preventive services as well as general preventive health recommendations were provided to patient.  See attached scanned questionnaire for additional information.   Signed,  Fabio Neighbors, LPN Nurse Health Advisor   MD Recommendations: None, declined tetanus vaccine today.

## 2017-07-06 NOTE — Patient Instructions (Signed)
Kelly Clark , Thank you for taking time to come for your Medicare Wellness Visit. I appreciate your ongoing commitment to your health goals. Please review the following plan we discussed and let me know if I can assist you in the future.   Screening recommendations/referrals: Colonoscopy: completed 05/08/06 Mammogram: completed 11/22/16 Bone Density: completed 10/06/10 Recommended yearly ophthalmology/optometry visit for glaucoma screening and checkup Recommended yearly dental visit for hygiene and checkup  Vaccinations: Influenza vaccine: due 08/2017 Pneumococcal vaccine: completed series Tdap vaccine: declined Shingles vaccine: declined  Advanced directives: Already on file.  Conditions/risks identified: Recommend increasing water intake to 4-6 glasses a day.   Next appointment: 10/10/17 @ 10:00 AM   Preventive Care 65 Years and Older, Female Preventive care refers to lifestyle choices and visits with your health care provider that can promote health and wellness. What does preventive care include?  A yearly physical exam. This is also called an annual well check.  Dental exams once or twice a year.  Routine eye exams. Ask your health care provider how often you should have your eyes checked.  Personal lifestyle choices, including:  Daily care of your teeth and gums.  Regular physical activity.  Eating a healthy diet.  Avoiding tobacco and drug use.  Limiting alcohol use.  Practicing safe sex.  Taking low-dose aspirin every day.  Taking vitamin and mineral supplements as recommended by your health care provider. What happens during an annual well check? The services and screenings done by your health care provider during your annual well check will depend on your age, overall health, lifestyle risk factors, and family history of disease. Counseling  Your health care provider may ask you questions about your:  Alcohol use.  Tobacco use.  Drug  use.  Emotional well-being.  Home and relationship well-being.  Sexual activity.  Eating habits.  History of falls.  Memory and ability to understand (cognition).  Work and work Statistician.  Reproductive health. Screening  You may have the following tests or measurements:  Height, weight, and BMI.  Blood pressure.  Lipid and cholesterol levels. These may be checked every 5 years, or more frequently if you are over 40 years old.  Skin check.  Lung cancer screening. You may have this screening every year starting at age 86 if you have a 30-pack-year history of smoking and currently smoke or have quit within the past 15 years.  Fecal occult blood test (FOBT) of the stool. You may have this test every year starting at age 33.  Flexible sigmoidoscopy or colonoscopy. You may have a sigmoidoscopy every 5 years or a colonoscopy every 10 years starting at age 67.  Hepatitis C blood test.  Hepatitis B blood test.  Sexually transmitted disease (STD) testing.  Diabetes screening. This is done by checking your blood sugar (glucose) after you have not eaten for a while (fasting). You may have this done every 1-3 years.  Bone density scan. This is done to screen for osteoporosis. You may have this done starting at age 62.  Mammogram. This may be done every 1-2 years. Talk to your health care provider about how often you should have regular mammograms. Talk with your health care provider about your test results, treatment options, and if necessary, the need for more tests. Vaccines  Your health care provider may recommend certain vaccines, such as:  Influenza vaccine. This is recommended every year.  Tetanus, diphtheria, and acellular pertussis (Tdap, Td) vaccine. You may need a Td booster every 10 years.  Zoster vaccine. You may need this after age 5.  Pneumococcal 13-valent conjugate (PCV13) vaccine. One dose is recommended after age 62.  Pneumococcal polysaccharide  (PPSV23) vaccine. One dose is recommended after age 25. Talk to your health care provider about which screenings and vaccines you need and how often you need them. This information is not intended to replace advice given to you by your health care provider. Make sure you discuss any questions you have with your health care provider. Document Released: 01/01/2016 Document Revised: 08/24/2016 Document Reviewed: 10/06/2015 Elsevier Interactive Patient Education  2017 [place] Prevention in the Home Falls can cause injuries. They can happen to people of all ages. There are many things you can do to make your home safe and to help prevent falls. What can I do on the outside of my home?  Regularly fix the edges of walkways and driveways and fix any cracks.  Remove anything that might make you trip as you walk through a door, such as a raised step or threshold.  Trim any bushes or trees on the path to your home.  Use bright outdoor lighting.  Clear any walking paths of anything that might make someone trip, such as rocks or tools.  Regularly check to see if handrails are loose or broken. Make sure that both sides of any steps have handrails.  Any raised decks and porches should have guardrails on the edges.  Have any leaves, snow, or ice cleared regularly.  Use sand or salt on walking paths during winter.  Clean up any spills in your garage right away. This includes oil or grease spills. What can I do in the bathroom?  Use night lights.  Install grab bars by the toilet and in the tub and shower. Do not use towel bars as grab bars.  Use non-skid mats or decals in the tub or shower.  If you need to sit down in the shower, use a plastic, non-slip stool.  Keep the floor dry. Clean up any water that spills on the floor as soon as it happens.  Remove soap buildup in the tub or shower regularly.  Attach bath mats securely with double-sided non-slip rug tape.  Do not have  throw rugs and other things on the floor that can make you trip. What can I do in the bedroom?  Use night lights.  Make sure that you have a light by your bed that is easy to reach.  Do not use any sheets or blankets that are too big for your bed. They should not hang down onto the floor.  Have a firm chair that has side arms. You can use this for support while you get dressed.  Do not have throw rugs and other things on the floor that can make you trip. What can I do in the kitchen?  Clean up any spills right away.  Avoid walking on wet floors.  Keep items that you use a lot in easy-to-reach places.  If you need to reach something above you, use a strong step stool that has a grab bar.  Keep electrical cords out of the way.  Do not use floor polish or wax that makes floors slippery. If you must use wax, use non-skid floor wax.  Do not have throw rugs and other things on the floor that can make you trip. What can I do with my stairs?  Do not leave any items on the stairs.  Make sure that there are  handrails on both sides of the stairs and use them. Fix handrails that are broken or loose. Make sure that handrails are as long as the stairways.  Check any carpeting to make sure that it is firmly attached to the stairs. Fix any carpet that is loose or worn.  Avoid having throw rugs at the top or bottom of the stairs. If you do have throw rugs, attach them to the floor with carpet tape.  Make sure that you have a light switch at the top of the stairs and the bottom of the stairs. If you do not have them, ask someone to add them for you. What else can I do to help prevent falls?  Wear shoes that:  Do not have high heels.  Have rubber bottoms.  Are comfortable and fit you well.  Are closed at the toe. Do not wear sandals.  If you use a stepladder:  Make sure that it is fully opened. Do not climb a closed stepladder.  Make sure that both sides of the stepladder are  locked into place.  Ask someone to hold it for you, if possible.  Clearly mark and make sure that you can see:  Any grab bars or handrails.  First and last steps.  Where the edge of each step is.  Use tools that help you move around (mobility aids) if they are needed. These include:  Canes.  Walkers.  Scooters.  Crutches.  Turn on the lights when you go into a dark area. Replace any light bulbs as soon as they burn out.  Set up your furniture so you have a clear path. Avoid moving your furniture around.  If any of your floors are uneven, fix them.  If there are any pets around you, be aware of where they are.  Review your medicines with your doctor. Some medicines can make you feel dizzy. This can increase your chance of falling. Ask your doctor what other things that you can do to help prevent falls. This information is not intended to replace advice given to you by your health care provider. Make sure you discuss any questions you have with your health care provider. Document Released: 10/01/2009 Document Revised: 05/12/2016 Document Reviewed: 01/09/2015 Elsevier Interactive Patient Education  2017 [employer].

## 2017-08-22 LAB — LIPID PANEL
Cholesterol: 166 (ref 0–200)
HDL: 48 (ref 35–70)
LDL Cholesterol: 94
LDl/HDL Ratio: 3.5
Triglycerides: 121 (ref 40–160)

## 2017-08-22 LAB — BASIC METABOLIC PANEL
Creatinine: 0.8 (ref 0.5–1.1)
Glucose: 128

## 2017-08-22 LAB — HEMOGLOBIN A1C: Hemoglobin A1C: 5.4

## 2017-08-23 DIAGNOSIS — L02419 Cutaneous abscess of limb, unspecified: Secondary | ICD-10-CM | POA: Diagnosis not present

## 2017-08-25 DIAGNOSIS — L02419 Cutaneous abscess of limb, unspecified: Secondary | ICD-10-CM | POA: Diagnosis not present

## 2017-08-27 DIAGNOSIS — L02419 Cutaneous abscess of limb, unspecified: Secondary | ICD-10-CM | POA: Diagnosis not present

## 2017-08-30 DIAGNOSIS — L0231 Cutaneous abscess of buttock: Secondary | ICD-10-CM | POA: Diagnosis not present

## 2017-08-30 DIAGNOSIS — R350 Frequency of micturition: Secondary | ICD-10-CM | POA: Diagnosis not present

## 2017-09-04 ENCOUNTER — Ambulatory Visit (INDEPENDENT_AMBULATORY_CARE_PROVIDER_SITE_OTHER): Payer: PPO | Admitting: Family Medicine

## 2017-09-04 VITALS — BP 152/68 | HR 92 | Temp 97.9°F | Resp 16 | Wt 186.0 lb

## 2017-09-04 DIAGNOSIS — L0231 Cutaneous abscess of buttock: Secondary | ICD-10-CM

## 2017-09-04 DIAGNOSIS — I10 Essential (primary) hypertension: Secondary | ICD-10-CM

## 2017-09-04 DIAGNOSIS — R350 Frequency of micturition: Secondary | ICD-10-CM

## 2017-09-04 LAB — POCT URINALYSIS DIPSTICK
Bilirubin, UA: NEGATIVE
Blood, UA: NEGATIVE
Glucose, UA: NEGATIVE
Ketones, UA: NEGATIVE
Leukocytes, UA: NEGATIVE
Nitrite, UA: NEGATIVE
Protein, UA: NEGATIVE
Spec Grav, UA: 1.01 (ref 1.010–1.025)
Urobilinogen, UA: 0.2 E.U./dL
pH, UA: 7 (ref 5.0–8.0)

## 2017-09-04 NOTE — Patient Instructions (Addendum)
Sitz bath daily and use the cream as directed. Wash sheets and clothes in hot water.    MRSA Infection, Adult MRSA stands for methicillin-resistant Staphylococcus aureus. This type of infection is caused by Staphylococcus aureus bacteria that are no longer affected by the medicines used to kill them (drug resistant). Staphylococcus (staph) bacteria are normally found on the skin or in the nose of healthy people. In most cases, these bacteria do not cause infection. But if these resistant bacteria enter your body through a cut or sore, they can cause a serious infection on your skin or in other parts of your body. There is a slight chance that the staph on your skin or in your nose is MRSA. There are two types of MRSA infections:  Hospital-acquired MRSA is bacteria that you get in the hospital.  Community-acquired MRSA is bacteria that you get somewhere other than in a hospital.  What increases the risk? Hospital-acquired MRSA is more common. You could be at risk for this infection if you are in the hospital and you:  Have surgery or a procedure.  Have an IV access or a catheter tube placed in your body.  Have weak resistance to germs (weakened immune system).  Are elderly.  Are on kidney dialysis.  You could be at risk for community-acquired MRSA if you have a break in your skin and come into contact with MRSA. This may happen if you:  Play sports where there is skin-to-skin contact.  Live in a crowded setting, like a dormitory or a [employer]  Share towels, razors, or sports equipment with other people.  What are the signs or symptoms? Symptoms of hospital-acquired MRSA depend on where MRSA has spread. Symptoms may include:  Wound infection.  Skin infection.  Rash.  Pneumonia.  Fever and chills.  Difficulty breathing.  Chest pain.  Community-acquired MRSA is most likely to start as a scratch or cut that becomes infected. Symptoms may include:  A pus-filled  pimple.  A boil on your skin.  Pus draining from your skin.  A sore (abscess) under your skin or somewhere in your body.  Fever with or without chills.  How is this diagnosed? The diagnosis of MRSA is made by taking a sample from an infected area and sending it to a lab for testing. A lab technician can grow (culture) MRSA and check it under a microscope. The cultured MRSA can be tested to see which type of antibiotic medicine will work to treat it. Newer tests can identify MRSA more quickly by testing bacteria samples for MRSA genes. Your health care provider can diagnose MRSA using samples from:  Cuts or wounds in infected areas.  Nasal swabs.  Saliva or cough specimens from deep in the lungs (sputum).  Urine.  Blood.  You may also have:  Imaging studies (such as X-ray or MRI) to check if the infection has spread to the lungs, bones, or joints.  A culture and sensitivity test of blood or fluids from inside the joints.  How is this treated? Treatment depends on how severe, deep, or extensive the infection is. Very bad infections may require a hospital stay.  Some skin infections, such as a small boil or sore (abscess), may be treated by draining pus from the site of the infection.  More extensive surgery to drain pus may be necessary for deeper or more widespread soft tissue infections.  You may then have to take antibiotic medicine given by mouth or through a vein. You  may start antibiotic treatment right away or after testing can be done to see what antibiotic medicine should be used.  Follow these instructions at home:  Take your antibiotics as directed by your health care provider. Take the medicine as prescribed until it is finished.  Avoid close contact with those around you as much as possible. Do not use towels, razors, toothbrushes, bedding, or other items that will be used by others.  Wash your hands frequently for 15 seconds with soap and water. Dry your hands  with a clean or disposable towel.  When you are not able to wash your hands, use hand sanitizer that is more than 60 percent alcohol.  Wash towels, sheets, or clothes in the washing machine with detergent and hot water. Dry them in a hot dryer.  Follow your health care provider's instructions for wound care. Wash your hands before and after changing your bandages.  Always shower after exercising.  Keep all cuts and scrapes clean and covered with a bandage.  Be sure to tell all your health care providers that you have MRSA so they are aware of your infection. Contact a health care provider if:  You have a cut, scrape, pimple, or boil that becomes red, swollen, or painful or has pus in it.  You have pus draining from your skin.  You have an abscess under your skin or somewhere in your body. Get help right away if:  You have symptoms of a skin infection with a fever or chills.  You have trouble breathing.  You have chest pain.  You have a skin wound and you become nauseous or start vomiting. This information is not intended to replace advice given to you by your health care provider. Make sure you discuss any questions you have with your health care provider. Document Released: 12/05/2005 Document Revised: 05/12/2016 Document Reviewed: 09/27/2013 Elsevier Interactive Patient Education  2017 [employer].

## 2017-09-04 NOTE — Progress Notes (Signed)
Patient: Kelly Clark Female    DOB: [birth year]   83 y.o.   MRN: [id number] Visit Date: 09/04/2017  Today's Provider: Wilhemena Durie, MD   Chief Complaint  Patient presents with  . Follow-up    from urgent care for abcess of buttock   Subjective:    HPI   Pt is here today for a abscess on her left buttock. She was seen at the urgent care for this. They have lancets it, packed it and given her Clindamycin and now is taking Bactrim for this. She was treated with 2 rounds of antibiotic injections one Rocephin and one Clindamycin. She reports that she is feeling better. The urgent care did a culture and came back that it was MRSA. Daughter is concerned about the fact that it is MRSA and that she is not better by now and that maybe she needs another culture. Pt reports that she is feeling better and is less sore and is not having to take pain medication. Daughter is to go back to [place].       No Known Allergies   Current Outpatient Prescriptions:  .  amLODipine (NORVASC) 5 MG tablet, Take 1 tablet (5 mg total) by mouth daily., Disp: 90 tablet, Rfl: 1 .  Cholecalciferol (VITAMIN D PO), Take 1 capsule by mouth daily., Disp: , Rfl:  .  clobetasol ointment (TEMOVATE) 0.05 %, APPLY TO AFFECTED AREAS ONCE WEEKLY, Disp: , Rfl: 0 .  Coenzyme Q10 (CO Q 10) 60 MG CAPS, Take 1 capsule by mouth daily. , Disp: , Rfl:  .  gabapentin (NEURONTIN) 600 MG tablet, Take 1 tablet (600 mg total) by mouth at bedtime., Disp: 90 tablet, Rfl: 1 .  hydrochlorothiazide (HYDRODIURIL) 12.5 MG tablet, Take 1 tablet (12.5 mg total) by mouth daily., Disp: 90 tablet, Rfl: 1 .  latanoprost (XALATAN) 0.005 % ophthalmic solution, Place 1 drop into both eyes at bedtime. , Disp: , Rfl: 0 .  Multiple Vitamin (MULTIVITAMIN) tablet, Take 1 tablet by mouth daily., Disp: , Rfl:  .  multivitamin-lutein (OCUVITE-LUTEIN) CAPS capsule, Take 1 capsule by mouth daily., Disp: , Rfl:  .  mupirocin ointment (BACTROBAN) 2 %,  apply to affected area twice a day, Disp: , Rfl: 0 .  polyethylene glycol powder (MIRALAX) powder, Take 1 Container by mouth daily. , Disp: , Rfl:  .  sulfamethoxazole-trimethoprim (BACTRIM DS,SEPTRA DS) 800-160 MG tablet, Take 1 tablet by mouth 2 (two) times daily. for 10 days, Disp: , Rfl: 0  Review of Systems  Constitutional: Negative.   HENT: Negative.   Eyes: Negative.   Respiratory: Negative.   Cardiovascular: Negative.   Gastrointestinal: Negative.   Endocrine: Negative.   Genitourinary: Negative.   Musculoskeletal: Negative.   Skin: Positive for wound.  Allergic/Immunologic: Negative.   Neurological: Negative.   Hematological: Negative.   Psychiatric/Behavioral: Negative.     Social History  Substance Use Topics  . Smoking status: Never Smoker  . Smokeless tobacco: Never Used  . Alcohol use No   Objective:   BP (!) 152/68 (BP Location: Left Arm, Patient Position: Sitting, Cuff Size: Large)   Pulse 92   Temp 97.9 F (36.6 C) (Oral)   Resp 16   Wt 186 lb (84.4 kg)   BMI 33.48 kg/m  Vitals:   09/04/17 1455  BP: (!) 152/68  Pulse: 92  Resp: 16  Temp: 97.9 F (36.6 C)  TempSrc: Oral  Weight: 186 lb (84.4 kg)  Physical Exam  Constitutional: She is oriented to person, place, and time. She appears well-developed and well-nourished.  Eyes: Conjunctivae are normal. No scleral icterus.  Neck: No thyromegaly present.  Cardiovascular: Normal rate and regular rhythm.   Pulmonary/Chest: Effort normal and breath sounds normal.  Abdominal: Soft.  Lymphadenopathy:    She has no cervical adenopathy.  Neurological: She is alert and oriented to person, place, and time.  Skin: Skin is warm and dry.  Healing sore on buttocks. Presently there is no abscess.  Psychiatric: She has a normal mood and affect. Her behavior is normal. Judgment and thought content normal.        Assessment & Plan:     1. Abscess of buttock, left Treat with bactrim at urgent care. Follow  up in 1 week.  Presently healing well. 2.HTN     HPI, Exam, and A&P Transcribed under the direction and in the presence of Alethia Melendrez L. Cranford Mon, MD  Electronically Signed: Katina Dung, [place], MD  [facility]

## 2017-09-06 ENCOUNTER — Ambulatory Visit: Payer: Self-pay | Admitting: Family Medicine

## 2017-09-08 DIAGNOSIS — I10 Essential (primary) hypertension: Secondary | ICD-10-CM | POA: Insufficient documentation

## 2017-09-11 ENCOUNTER — Ambulatory Visit (INDEPENDENT_AMBULATORY_CARE_PROVIDER_SITE_OTHER): Payer: PPO | Admitting: Family Medicine

## 2017-09-11 ENCOUNTER — Encounter: Payer: Self-pay | Admitting: Family Medicine

## 2017-09-11 VITALS — BP 132/72 | HR 82 | Temp 97.8°F | Resp 16 | Wt 182.0 lb

## 2017-09-11 DIAGNOSIS — L0231 Cutaneous abscess of buttock: Secondary | ICD-10-CM

## 2017-09-11 NOTE — Progress Notes (Signed)
Patient: Kelly Clark Female    DOB: Nov 16, 1929   83 y.o.   MRN: [id number] Visit Date: 09/11/2017  Today's Provider: Wilhemena Durie, MD   Chief Complaint  Patient presents with  . Abscess   Subjective:    HPI Pt is here for a 1 week follow up on a abscess on her buttock. She reports that it is feeling better. She has been doing the sitz baths twice a day and keeping it covered. She has been having some back pain located on her lower back. She is not sure if has pulled something while putting on the bandages because it is hard to get on. She describes it as a "catch". She wants to know if she needs to continue the antibiotic and the ointment. She reports that she does has a little "pimple" on the left buttock near the abscess.      No Known Allergies   Current Outpatient Prescriptions:  .  amLODipine (NORVASC) 5 MG tablet, Take 1 tablet (5 mg total) by mouth daily., Disp: 90 tablet, Rfl: 1 .  Cholecalciferol (VITAMIN D PO), Take 1 capsule by mouth daily., Disp: , Rfl:  .  clobetasol ointment (TEMOVATE) 0.05 %, APPLY TO AFFECTED AREAS ONCE WEEKLY, Disp: , Rfl: 0 .  Coenzyme Q10 (CO Q 10) 60 MG CAPS, Take 1 capsule by mouth daily. , Disp: , Rfl:  .  gabapentin (NEURONTIN) 600 MG tablet, Take 1 tablet (600 mg total) by mouth at bedtime., Disp: 90 tablet, Rfl: 1 .  hydrochlorothiazide (HYDRODIURIL) 12.5 MG tablet, Take 1 tablet (12.5 mg total) by mouth daily., Disp: 90 tablet, Rfl: 1 .  latanoprost (XALATAN) 0.005 % ophthalmic solution, Place 1 drop into both eyes at bedtime. , Disp: , Rfl: 0 .  Multiple Vitamin (MULTIVITAMIN) tablet, Take 1 tablet by mouth daily., Disp: , Rfl:  .  multivitamin-lutein (OCUVITE-LUTEIN) CAPS capsule, Take 1 capsule by mouth daily., Disp: , Rfl:  .  mupirocin ointment (BACTROBAN) 2 %, apply to affected area twice a day, Disp: , Rfl: 0 .  polyethylene glycol powder (MIRALAX) powder, Take 1 Container by mouth daily. , Disp: , Rfl:  .   sulfamethoxazole-trimethoprim (BACTRIM DS,SEPTRA DS) 800-160 MG tablet, Take 1 tablet by mouth 2 (two) times daily. for 10 days, Disp: , Rfl: 0  Review of Systems  Constitutional: Negative.   HENT: Negative.   Eyes: Negative.   Respiratory: Negative.   Cardiovascular: Negative.   Gastrointestinal: Negative.   Endocrine: Negative.   Genitourinary: Negative.   Musculoskeletal: Negative.   Skin: Positive for wound.  Allergic/Immunologic: Negative.   Neurological: Negative.   Hematological: Negative.   Psychiatric/Behavioral: Negative.     Social History  Substance Use Topics  . Smoking status: Never Smoker  . Smokeless tobacco: Never Used  . Alcohol use No   Objective:   BP 132/72 (BP Location: Left Arm, Patient Position: Sitting, Cuff Size: Normal)   Pulse 82   Temp 97.8 F (36.6 C) (Oral)   Resp 16   Wt 182 lb (82.6 kg)   BMI 32.76 kg/m  Vitals:   09/11/17 1150  BP: 132/72  Pulse: 82  Resp: 16  Temp: 97.8 F (36.6 C)  TempSrc: Oral  Weight: 182 lb (82.6 kg)     Physical Exam  Constitutional: She appears well-developed and well-nourished.  HENT:  Head: Normocephalic and atraumatic.  Right Ear: External ear normal.  Left Ear: External ear normal.  Nose: Nose normal.  Eyes: Conjunctivae are normal. No scleral icterus.  Neck: No thyromegaly present.  Cardiovascular: Normal rate and regular rhythm.   Pulmonary/Chest: Effort normal.  Abdominal: Soft.  Skin: Skin is warm and dry.  No induration around former abscess. Central 5-6 mm granulation tissue.   Psychiatric: She has a normal mood and affect. Her behavior is normal. Judgment and thought content normal.        Assessment & Plan:     1. Abscess of buttock, left Finish you antibiotics. Does not have to use the ointment until finished with antibiotics then restart the ointment. Continue sitz baths at least once a day. Area is 50% better, and will follow up in a week and a half. But only following up  because of where the abscess is located. Call if not continuing to feel good or noticed more drainage on band aids.  Wound clinic if this worsens.    HPI, Exam, and A&P Transcribed under the direction and in the presence of Ballard Budney L. Cranford Mon, MD  Electronically Signed: Katina Dung, [place], MD  [facility]

## 2017-09-11 NOTE — Patient Instructions (Addendum)
Finish you antibiotics. Does not have to use the ointment until finished with antibiotics then restart the ointment. Continue sitz baths at least once a day.  Area is 50% better, and will follow up in a week and a half. But only following up because of where the abscess is located. Call if not continuing to feel good or noticed more drainage on band aids.

## 2017-09-21 ENCOUNTER — Telehealth: Payer: Self-pay | Admitting: Family Medicine

## 2017-09-21 ENCOUNTER — Encounter: Payer: Self-pay | Admitting: Family Medicine

## 2017-09-21 ENCOUNTER — Ambulatory Visit (INDEPENDENT_AMBULATORY_CARE_PROVIDER_SITE_OTHER): Payer: PPO | Admitting: Family Medicine

## 2017-09-21 VITALS — BP 122/60 | HR 70 | Temp 97.5°F | Resp 14 | Wt 181.0 lb

## 2017-09-21 DIAGNOSIS — L0231 Cutaneous abscess of buttock: Secondary | ICD-10-CM

## 2017-09-21 MED ORDER — SULFAMETHOXAZOLE-TRIMETHOPRIM 800-160 MG PO TABS
1.0000 | ORAL_TABLET | Freq: Two times a day (BID) | ORAL | 0 refills | Status: DC
Start: 2017-09-21 — End: 2017-10-10

## 2017-09-21 NOTE — Telephone Encounter (Signed)
Ortencia Kick at the pharmacy that quantity should be 20. She will get that corrected-Anastasiya V Hopkins, RMA

## 2017-09-21 NOTE — Telephone Encounter (Signed)
FYI--Referral was sent to [facility] wound clinic and voicemail also left but their office will be closed the rest of this week

## 2017-09-21 NOTE — Progress Notes (Signed)
Patient: Kelly Clark Female    DOB: February 17, 1929   83 y.o.   MRN: [id number] Visit Date: 09/21/2017  Today's Provider: Wilhemena Durie, MD   Chief Complaint  Patient presents with  . Follow-up   Subjective:    HPI Pt is here for a follow up of abscess on her buttock. She says the area is better and healing well, the area around it where she has been having [place] band aids is irritated and painful. She also reports that she is having burning between her rectum and her vagina. She reports that she has had this in the past and has a cream that cures it from her dermatologist but she wants to make sure it is not infected like the abscess was. Also wants to know if she should be taking probiotics since she is off the antibiotic. She is having constipation but no other GI symptoms.       No Known Allergies   Current Outpatient Prescriptions:  .  amLODipine (NORVASC) 5 MG tablet, Take 1 tablet (5 mg total) by mouth daily., Disp: 90 tablet, Rfl: 1 .  Cholecalciferol (VITAMIN D PO), Take 1 capsule by mouth daily., Disp: , Rfl:  .  clobetasol ointment (TEMOVATE) 0.05 %, APPLY TO AFFECTED AREAS ONCE WEEKLY, Disp: , Rfl: 0 .  Coenzyme Q10 (CO Q 10) 60 MG CAPS, Take 1 capsule by mouth daily. , Disp: , Rfl:  .  gabapentin (NEURONTIN) 600 MG tablet, Take 1 tablet (600 mg total) by mouth at bedtime., Disp: 90 tablet, Rfl: 1 .  hydrochlorothiazide (HYDRODIURIL) 12.5 MG tablet, Take 1 tablet (12.5 mg total) by mouth daily., Disp: 90 tablet, Rfl: 1 .  latanoprost (XALATAN) 0.005 % ophthalmic solution, Place 1 drop into both eyes at bedtime. , Disp: , Rfl: 0 .  Multiple Vitamin (MULTIVITAMIN) tablet, Take 1 tablet by mouth daily., Disp: , Rfl:  .  multivitamin-lutein (OCUVITE-LUTEIN) CAPS capsule, Take 1 capsule by mouth daily., Disp: , Rfl:  .  mupirocin ointment (BACTROBAN) 2 %, apply to affected area twice a day, Disp: , Rfl: 0 .  polyethylene glycol powder (MIRALAX) powder, Take 1 Container  by mouth daily. , Disp: , Rfl:  .  sulfamethoxazole-trimethoprim (BACTRIM DS,SEPTRA DS) 800-160 MG tablet, Take 1 tablet by mouth 2 (two) times daily. for 10 days, Disp: , Rfl: 0  Review of Systems  Constitutional: Negative.   HENT: Negative.   Eyes: Negative.   Respiratory: Negative.   Cardiovascular: Negative.   Gastrointestinal: Negative.   Endocrine: Negative.   Genitourinary: Negative.   Musculoskeletal: Negative.   Skin: Positive for wound.  Allergic/Immunologic: Negative.   Neurological: Negative.   Hematological: Negative.   Psychiatric/Behavioral: Negative.     Social History  Substance Use Topics  . Smoking status: Never Smoker  . Smokeless tobacco: Never Used  . Alcohol use No   Objective:   BP 122/60 (BP Location: Left Arm, Patient Position: Sitting, Cuff Size: Large)   Pulse 70   Temp (!) 97.5 F (36.4 C) (Oral)   Resp 14   Wt 181 lb (82.1 kg)   BMI 32.58 kg/m  Vitals:   09/21/17 1145  BP: 122/60  Pulse: 70  Resp: 14  Temp: (!) 97.5 F (36.4 C)  TempSrc: Oral  Weight: 181 lb (82.1 kg)     Physical Exam  Constitutional: She is oriented to person, place, and time. She appears well-developed and well-nourished.  HENT:  Head: Normocephalic and  atraumatic.  Eyes: Conjunctivae are normal. No scleral icterus.  Neck: No thyromegaly present.  Cardiovascular: Normal rate, regular rhythm and normal heart sounds.   Pulmonary/Chest: Effort normal and breath sounds normal.  Abdominal: Soft.  Neurological: She is alert and oriented to person, place, and time.  Skin: Skin is warm and dry.  Stage II to 3 sacral/ left buttocks decubitus with surrounding erythema and induration. This is slowly improving over the past few weeks  Psychiatric: She has a normal mood and affect. Her behavior is normal. Judgment and thought content normal.        Assessment & Plan:     Sacral decubitus/small abscess This is an proving but very very slowly. Refer to refer to  wound care for treatment. Hypertension Controlled.     I have done the exam and reviewed the above chart and it is accurate to the best of my knowledge. Development worker, community has been used in this note in any air is in the dictation or transcription are unintentional.  Wilhemena Durie, MD  [place]

## 2017-09-21 NOTE — Telephone Encounter (Signed)
Wells Guiles with [facility] is request a call to discuss the quanitity for Rx sulfamethoxazole-trimethoprim (BACTRIM DS,SEPTRA DS) 800-160 MG tablet.  CB#[phone]/MW

## 2017-09-25 ENCOUNTER — Encounter: Payer: PPO | Attending: Surgery | Admitting: Surgery

## 2017-09-25 DIAGNOSIS — S31829A Unspecified open wound of left buttock, initial encounter: Secondary | ICD-10-CM | POA: Insufficient documentation

## 2017-09-25 DIAGNOSIS — T8189XA Other complications of procedures, not elsewhere classified, initial encounter: Secondary | ICD-10-CM | POA: Diagnosis not present

## 2017-09-25 DIAGNOSIS — Z79899 Other long term (current) drug therapy: Secondary | ICD-10-CM | POA: Insufficient documentation

## 2017-09-25 DIAGNOSIS — I10 Essential (primary) hypertension: Secondary | ICD-10-CM | POA: Insufficient documentation

## 2017-09-25 DIAGNOSIS — X58XXXA Exposure to other specified factors, initial encounter: Secondary | ICD-10-CM | POA: Insufficient documentation

## 2017-09-25 DIAGNOSIS — G629 Polyneuropathy, unspecified: Secondary | ICD-10-CM | POA: Diagnosis not present

## 2017-09-26 NOTE — Progress Notes (Signed)
KEYANDRA, SWENSON ([id number]) Visit Report for 09/25/2017 Abuse/Suicide Risk Screen Details Patient Name: Kelly Clark, Kelly Clark Date of Service: 09/25/2017 10:30 AM Medical Record Number: [id number] Patient Account Number: [id number] Date of Birth/Sex: 06-23-1929 (83 y.o. Female) Treating RN: Carolyne Fiscal, Debi Primary Care Deontray Hunnicutt: Cranford Mon, Delfino Lovett Other Clinician: Referring Salvadore Valvano: Cranford Mon, RICHARD Treating Maudell Stanbrough/Extender: Frann Rider in Treatment: 0 Abuse/Suicide Risk Screen Items Answer ABUSE/SUICIDE RISK SCREEN: Has anyone close to you tried to hurt or harm you recentlyo No Do you feel uncomfortable with anyone in your familyo No Has anyone forced you do things that you didnot want to doo No Do you have any thoughts of harming yourselfo No Patient displays signs or symptoms of abuse and/or neglect. No Electronic Signature(s) Signed: 09/25/2017 5:09:28 PM By: Alric Quan Entered By: Alric Quan on 09/25/2017 10:39:09 Tiberio, [name] ([id number]) -------------------------------------------------------------------------------- Activities of Daily Living Details Patient Name: Kelly Clark Date of Service: 09/25/2017 10:30 AM Medical Record Number: [id number] Patient Account Number: [id number] Date of Birth/Sex: 1929-09-28 (83 y.o. Female) Treating RN: Carolyne Fiscal, Debi Primary Care Sedrick Tober: Cranford Mon, Delfino Lovett Other Clinician: Referring Ellesse Antenucci: Cranford Mon, RICHARD Treating Eathon Valade/Extender: Frann Rider in Treatment: 0 Activities of Daily Living Items Answer Activities of Daily Living (Please select one for each item) Drive Automobile Completely Able Take Medications Completely Able Use Telephone Completely Able Care for Appearance Completely Able Use Toilet Completely Able Bath / Shower Completely Able Dress Self Completely Able Feed Self Completely Able Walk Completely Able Get In / Out Bed Completely Able Housework Completely Able Prepare  Meals Completely [place] for Self Completely Able Electronic Signature(s) Signed: 09/25/2017 5:09:28 PM By: Alric Quan Entered By: Alric Quan on 09/25/2017 10:39:32 Mentel, [name] ([id number]) -------------------------------------------------------------------------------- Education Assessment Details Patient Name: Kelly Clark Date of Service: 09/25/2017 10:30 AM Medical Record Number: [id number] Patient Account Number: [id number] Date of Birth/Sex: 29-Dec-1928 (83 y.o. Female) Treating RN: Carolyne Fiscal, Debi Primary Care Laurene Melendrez: Cranford Mon, Delfino Lovett Other Clinician: Referring Martino Tompson: Cranford Mon, RICHARD Treating Rayvon Brandvold/Extender: Frann Rider in Treatment: 0 Primary Learner Assessed: Patient Learning Preferences/Education Level/Primary Language Learning Preference: Explanation, Printed Material Highest Education Level: College or Above Preferred Language: English Cognitive Barrier Assessment/Beliefs Language Barrier: No Translator Needed: No Memory Deficit: No Emotional Barrier: No Cultural/Religious Beliefs Affecting Medical No Care: Physical Barrier Assessment Impaired Vision: Yes Glasses Impaired Hearing: No Decreased Hand dexterity: No Knowledge/Comprehension Assessment Knowledge Level: Medium Comprehension Level: Medium Ability to understand written Medium instructions: Ability to understand verbal Medium instructions: Motivation Assessment Anxiety Level: Calm Cooperation: Cooperative Education Importance: Acknowledges Need Interest in Health Problems: Asks Questions Perception: Coherent Willingness to Engage in Self- Medium Management Activities: Readiness to Engage in Self- Medium Management Activities: Electronic Signature(s) GREYDIS, STLOUIS ([id number]) Signed: 09/25/2017 5:09:28 PM By: Alric Quan Entered By: Alric Quan on 09/25/2017 10:39:50 PURVI, RUEHL  ([id number]) -------------------------------------------------------------------------------- Fall Risk Assessment Details Patient Name: Kelly Clark Date of Service: 09/25/2017 10:30 AM Medical Record Number: [id number] Patient Account Number: [id number] Date of Birth/Sex: Aug 21, 1929 (83 y.o. Female) Treating RN: Carolyne Fiscal, Debi Primary Care Shelah Heatley: Cranford Mon, Delfino Lovett Other Clinician: Referring Shenandoah Yeats: Cranford Mon, RICHARD Treating Fedora Knisely/Extender: Frann Rider in Treatment: 0 Fall Risk Assessment Items Have you had 2 or more falls in the last 12 monthso 0 No Have you had any fall that resulted in injury in the last 12 monthso 0 No FALL RISK ASSESSMENT: History of falling - immediate or within 3 months 0 No Secondary diagnosis 0 No  Ambulatory aid None/bed rest/wheelchair/nurse 0 No Crutches/cane/walker 0 No Furniture 0 No IV Access/Saline Lock 0 No Gait/Training Normal/bed rest/immobile 0 No Weak 0 No Impaired 0 No Mental Status Oriented to own ability 0 Yes Electronic Signature(s) Signed: 09/25/2017 5:09:28 PM By: Alric Quan Entered By: Alric Quan on 09/25/2017 10:39:59 Andis, [name] ([id number]) -------------------------------------------------------------------------------- Foot Assessment Details Patient Name: Kelly Clark Date of Service: 09/25/2017 10:30 AM Medical Record Number: [id number] Patient Account Number: [id number] Date of Birth/Sex: Jun 03, 1929 (83 y.o. Female) Treating RN: Carolyne Fiscal, Debi Primary Care Maycol Hoying: Cranford Mon, Delfino Lovett Other Clinician: Referring Mable Lashley: Cranford Mon, RICHARD Treating Demetries Coia/Extender: Frann Rider in Treatment: 0 Foot Assessment Items Site Locations + = Sensation present, - = Sensation absent, C = Callus, U = Ulcer R = Redness, W = Warmth, M = Maceration, PU = Pre-ulcerative lesion F = Fissure, S = Swelling, D = Dryness Assessment Right: Left: Other Deformity: No No Prior Foot Ulcer:  No No Prior Amputation: No No Charcot Joint: No No Ambulatory Status: Gait: Electronic Signature(s) Signed: 09/25/2017 5:09:28 PM By: Alric Quan Entered By: Alric Quan on 09/25/2017 10:40:13 Eggert, [name] ([id number]) -------------------------------------------------------------------------------- Nutrition Risk Assessment Details Patient Name: Kelly Clark Date of Service: 09/25/2017 10:30 AM Medical Record Number: [id number] Patient Account Number: [id number] Date of Birth/Sex: 08-24-1929 (83 y.o. Female) Treating RN: Carolyne Fiscal, Debi Primary Care Clayton Bosserman: Cranford Mon, Delfino Lovett Other Clinician: Referring Afreen Siebels: Cranford Mon, RICHARD Treating Jancie Kercher/Extender: Frann Rider in Treatment: 0 Height (in): 62 Weight (lbs): 178.6 Body Mass Index (BMI): 32.7 Nutrition Risk Assessment Items NUTRITION RISK SCREEN: I have an illness or condition that made me change the kind and/or 0 No amount of food I eat I eat fewer than two meals per day 0 No I eat few fruits and vegetables, or milk products 0 No I have three or more drinks of beer, liquor or wine almost every day 0 No I have tooth or mouth problems that make it hard for me to eat 0 No I don't always have enough money to buy the food I need 0 No I eat alone most of the time 0 No I take three or more different prescribed or over-the-counter drugs a 1 Yes day Without wanting to, I have lost or gained 10 pounds in the last six 0 No months I am not always physically able to shop, cook and/or feed myself 0 No Nutrition Protocols Good Risk Protocol 0 No interventions needed Moderate Risk Protocol Electronic Signature(s) Signed: 09/25/2017 5:09:28 PM By: Alric Quan Entered By: Alric Quan on 09/25/2017 10:40:06

## 2017-09-26 NOTE — Progress Notes (Signed)
SHANNEN, FLANSBURG ([id number]) Visit Report for 09/25/2017 Chief Complaint Document Details Patient Name: Kelly Clark, Kelly Clark Date of Service: 09/25/2017 10:30 AM Medical Record Number: [id number] Patient Account Number: [id number] Date of Birth/Sex: 03-22-1929 (83 y.o. Female) Treating RN: Carolyne Fiscal, Debi Primary Care Provider: Cranford Mon, Delfino Lovett Other Clinician: Referring Provider: Cranford Mon, Delfino Lovett Treating Provider/Extender: Frann Rider in Treatment: 0 Information Obtained from: Patient Chief Complaint Patient presents to the wound care center with open non-healing surgical wound(s) to the left buttock which she's had for about 6 weeks Electronic Signature(s) Signed: 09/25/2017 11:08:54 AM By: Christin Fudge MD, FACS Entered By: Christin Fudge on 09/25/2017 11:08:54 Enis Gash ([id number]) -------------------------------------------------------------------------------- Debridement Details Patient Name: Enis Gash Date of Service: 09/25/2017 10:30 AM Medical Record Number: [id number] Patient Account Number: [id number] Date of Birth/Sex: 09/04/1929 (83 y.o. Female) Treating RN: Carolyne Fiscal, Debi Primary Care Provider: Cranford Mon, Delfino Lovett Other Clinician: Referring Provider: Cranford Mon, RICHARD Treating Provider/Extender: Frann Rider in Treatment: 0 Debridement Performed for Wound #1 Left Gluteal fold Assessment: Performed By: Physician Christin Fudge, MD Debridement: Debridement Pre-procedure Verification/Time Out Yes - 11:00 Taken: Start Time: 11:01 Pain Control: Lidocaine 4% Topical Solution Level: Skin/Subcutaneous Tissue Total Area Debrided (L x 0.9 (cm) x 0.9 (cm) = 0.81 (cm) W): Tissue and other Viable, Non-Viable, Exudate, Fibrin/Slough, Subcutaneous material debrided: Instrument: Curette, Forceps Bleeding: Minimum Hemostasis Achieved: Pressure End Time: 11:03 Procedural Pain: 0 Post Procedural Pain: 0 Response to Treatment: Procedure was  tolerated well Post Debridement Measurements of Total Wound Length: (cm) 0.9 Width: (cm) 1 Depth: (cm) 0.1 Volume: (cm) 0.071 Character of Wound/Ulcer Post Requires Further Debridement Debridement: Post Procedure Diagnosis Same as Pre-procedure Electronic Signature(s) Signed: 09/25/2017 11:08:38 AM By: Christin Fudge MD, FACS Signed: 09/25/2017 5:09:28 PM By: Alric Quan Entered By: Christin Fudge on 09/25/2017 11:08:38 Diniz, [name] ([id number]) -------------------------------------------------------------------------------- HPI Details Patient Name: Enis Gash Date of Service: 09/25/2017 10:30 AM Medical Record Number: [id number] Patient Account Number: [id number] Date of Birth/Sex: November 04, 1929 (83 y.o. Female) Treating RN: Carolyne Fiscal, Debi Primary Care Provider: Wilhemena Durie Other Clinician: Referring Provider: Cranford Mon, RICHARD Treating Provider/Extender: Frann Rider in Treatment: 0 History of Present Illness Location: left gluteal and upper thigh Quality: Patient reports experiencing a dull pain to affected area(s). Severity: Patient states wound (s) are getting better. Duration: Patient has had the wound for < 6 weeks prior to presenting for treatment Timing: Pain in wound is Intermittent (comes and goes Context: The wound occurred when the patient had an abscess which had to be drained in the urgent care Modifying Factors: Other treatment(s) tried include:clindamycin and Bactrim Associated Signs and Symptoms: Patient reports having:no pain or drainage HPI Description: 83 year old patient was seen by her PCP Dr. Miguel Aschoff for an abscess on her buttock. She was treated with Bactrim orally and Bactroban ointment locally. She was diagnosed with the abscess about 6 weeks ago and had been treated at the urgent care with IandD and local care at that stage. He now continues to get better but was here for an Conservation officer, nature) Signed:  09/25/2017 11:08:59 AM By: Christin Fudge MD, FACS Previous Signature: 09/25/2017 11:07:52 AM Version By: Christin Fudge MD, FACS Previous Signature: 09/25/2017 10:55:27 AM Version By: Christin Fudge MD, FACS Entered By: Christin Fudge on 09/25/2017 11:08:59 RILEA, ARUTYUNYAN ([id number]) -------------------------------------------------------------------------------- Physical Exam Details Patient Name: Enis Gash Date of Service: 09/25/2017 10:30 AM Medical Record Number: [id number] Patient Account Number: [id number] Date of Birth/Sex: [birth year] (83 y.o. Female) Treating  RN: Ahmed Prima Primary Care Provider: Cranford Mon, Delfino Lovett Other Clinician: Referring Provider: Cranford Mon, RICHARD Treating Provider/Extender: Frann Rider in Treatment: 0 Constitutional . Pulse regular. Respirations normal and unlabored. Afebrile. . Eyes Nonicteric. Reactive to light. Ears, Nose, Mouth, and Throat Lips, teeth, and gums WNL.Marland Kitchen Moist mucosa without lesions. Neck supple and nontender. No palpable supraclavicular or cervical adenopathy. Normal sized without goiter. Respiratory WNL. No retractions.. Cardiovascular Pedal Pulses WNL. No clubbing, cyanosis or edema. Gastrointestinal (GI) Abdomen without masses or tenderness.. No liver or spleen enlargement or tenderness.. Lymphatic No adneopathy. No adenopathy. No adenopathy. Musculoskeletal Adexa without tenderness or enlargement.. Digits and nails w/o clubbing, cyanosis, infection, petechiae, ischemia, or inflammatory conditions.. Integumentary (Hair, Skin) No suspicious lesions. No crepitus or fluctuance. No peri-wound warmth or erythema. No masses.Marland Kitchen Psychiatric Judgement and insight Intact.. No evidence of depression, anxiety, or agitation.. Notes small open wound on the left gluteal area near the posterior thigh which has some necrotic debris stuck to it and the base of the wound has minimal healthy granulation tissue and there is no  surrounding erythema or any evidence of purulence. Electronic Signature(s) Signed: 09/25/2017 11:09:42 AM By: Christin Fudge MD, FACS Entered By: Christin Fudge on 09/25/2017 11:09:40 NAKEYSHA, PASQUAL ([id number]) -------------------------------------------------------------------------------- Physician Orders Details Patient Name: Enis Gash Date of Service: 09/25/2017 10:30 AM Medical Record Number: [id number] Patient Account Number: [id number] Date of Birth/Sex: 03-Mar-1929 (83 y.o. Female) Treating RN: Carolyne Fiscal, Debi Primary Care Provider: Cranford Mon, Delfino Lovett Other Clinician: Referring Provider: Cranford Mon, RICHARD Treating Provider/Extender: Frann Rider in Treatment: 0 Verbal / Phone Orders: Yes Clinician: Pinkerton, Debi Read Back and Verified: Yes Diagnosis Coding Wound Cleansing Wound #1 Left Gluteal fold o Clean wound with Normal Saline. o Cleanse wound with mild soap and water o May Shower, gently pat wound dry prior to applying new dressing. Anesthetic Wound #1 Left Gluteal fold o Topical Lidocaine 4% cream applied to wound bed prior to debridement Primary Wound Dressing Wound #1 Left Gluteal fold o Silvercel Non-Adherent Secondary Dressing Wound #1 Left Gluteal fold o Non-adherent pad - telfa island Follow-up Appointments Wound #1 Left Gluteal fold o Return Appointment in 1 week. Off-Loading Wound #1 Left Gluteal fold o Turn and reposition every 2 hours Additional Orders / Instructions Wound #1 Left Gluteal fold o Increase protein intake. Medications-please add to medication list. Wound #1 Left Gluteal fold o Other: - Vitamin C, Zinc, Vitamin A ANNMARGARET, DECAPRIO ([id number]) Electronic Signature(s) Signed: 09/25/2017 4:27:05 PM By: Christin Fudge MD, FACS Signed: 09/25/2017 5:09:28 PM By: Alric Quan Entered By: Alric Quan on 09/25/2017 11:12:05 ARNETT, GALINDEZ  ([id number]) -------------------------------------------------------------------------------- Problem List Details Patient Name: Enis Gash Date of Service: 09/25/2017 10:30 AM Medical Record Number: [id number] Patient Account Number: [id number] Date of Birth/Sex: 1928-12-26 (83 y.o. Female) Treating RN: Carolyne Fiscal, Debi Primary Care Provider: Cranford Mon, Delfino Lovett Other Clinician: Referring Provider: Cranford Mon, RICHARD Treating Provider/Extender: Frann Rider in Treatment: 0 Active Problems ICD-10 Encounter Code Description Active Date Diagnosis S31.829A Unspecified open wound of left buttock, initial encounter 09/25/2017 Yes Inactive Problems Resolved Problems Electronic Signature(s) Signed: 09/25/2017 11:08:24 AM By: Christin Fudge MD, FACS Entered By: Christin Fudge on 09/25/2017 11:08:24 Enis Gash ([id number]) -------------------------------------------------------------------------------- Progress Note Details Patient Name: Enis Gash Date of Service: 09/25/2017 10:30 AM Medical Record Number: [id number] Patient Account Number: [id number] Date of Birth/Sex: 03-19-1929 (83 y.o. Female) Treating RN: Carolyne Fiscal, Debi Primary Care Provider: Cranford Mon, Delfino Lovett Other Clinician: Referring Provider: Wilhemena Durie Treating Provider/Extender:  Vivian Neuwirth Weeks in Treatment: 0 Subjective Chief Complaint Information obtained from Patient Patient presents to the wound care center with open non-healing surgical wound(s) to the left buttock which she's had for about 6 weeks History of Present Illness (HPI) The following HPI elements were documented for the patient's wound: Location: left gluteal and upper thigh Quality: Patient reports experiencing a dull pain to affected area(s). Severity: Patient states wound (s) are getting better. Duration: Patient has had the wound for < 6 weeks prior to presenting for treatment Timing: Pain in wound is Intermittent (comes  and goes Context: The wound occurred when the patient had an abscess which had to be drained in the urgent care Modifying Factors: Other treatment(s) tried include:clindamycin and Bactrim Associated Signs and Symptoms: Patient reports having:no pain or drainage 83 year old patient was seen by her PCP Dr. Miguel Aschoff for an abscess on her buttock. She was treated with Bactrim orally and Bactroban ointment locally. She was diagnosed with the abscess about 6 weeks ago and had been treated at the urgent care with IandD and local care at that stage. He now continues to get better but was here for an opinion Wound History Patient presents with 1 open wound that has been present for approximately 1 month. Patient has been treating wound in the following manner: oral sulfur and bactroban. Laboratory tests have not been performed in the last month. Patient reportedly has tested positive for an antibiotic resistant organism. Patient reportedly has not tested positive for osteomyelitis. Patient reportedly has not had testing performed to evaluate circulation in the legs. Patient experiences the following problems associated with their wounds: infection, swelling. Patient History Information obtained from Patient. Allergies NKDA Family History KERRI, KOVACIK. ([id number]) Cancer - Siblings, No family history of Diabetes, Heart Disease, Hereditary Spherocytosis, Hypertension, Kidney Disease, Lung Disease, Seizures, Stroke, Thyroid Problems, Tuberculosis. Social History Never smoker, Marital Status - Widowed, Alcohol Use - Never, Drug Use - No History, Caffeine Use - Daily. Medical History Eyes Patient has history of Cataracts - surgery Cardiovascular Patient has history of Hypertension Neurologic Patient has history of Neuropathy Oncologic Denies history of Received Chemotherapy, Received Radiation Review of Systems (ROS) Constitutional Symptoms (Reader) The patient has no  complaints or symptoms. Eyes Complains or has symptoms of Glasses / Contacts. Ear/Nose/Mouth/Throat The patient has no complaints or symptoms. Hematologic/Lymphatic The patient has no complaints or symptoms. Respiratory The patient has no complaints or symptoms. Gastrointestinal The patient has no complaints or symptoms. Endocrine The patient has no complaints or symptoms. Genitourinary The patient has no complaints or symptoms. Immunological The patient has no complaints or symptoms. Musculoskeletal The patient has no complaints or symptoms. Oncologic melanoma Psychiatric The patient has no complaints or symptoms. Medications: the patient is on a myeloid appearing 5 mg, hydrochlorothiazide 12.5 mg, gabapentin 600 mg, Bactrim 1 twice a day for 10 days. SHAELEY, SEGALL. ([id number]) Objective Constitutional Pulse regular. Respirations normal and unlabored. Afebrile. Vitals Time Taken: 10:32 AM, Height: 62 in, Source: Stated, Weight: 178.6 lbs, Source: Measured, BMI: 32.7, Temperature: 97.9 F, Pulse: 67 bpm, Respiratory Rate: 18 breaths/min, Blood Pressure: 159/67 mmHg. Eyes Nonicteric. Reactive to light. Ears, Nose, Mouth, and Throat Lips, teeth, and gums WNL.Marland Kitchen Moist mucosa without lesions. Neck supple and nontender. No palpable supraclavicular or cervical adenopathy. Normal sized without goiter. Respiratory WNL. No retractions.. Cardiovascular Pedal Pulses WNL. No clubbing, cyanosis or edema. Gastrointestinal (GI) Abdomen without masses or tenderness.. No liver or spleen enlargement or tenderness.. Lymphatic No adneopathy. No  adenopathy. No adenopathy. Musculoskeletal Adexa without tenderness or enlargement.. Digits and nails w/o clubbing, cyanosis, infection, petechiae, ischemia, or inflammatory conditions.Marland Kitchen Psychiatric Judgement and insight Intact.. No evidence of depression, anxiety, or agitation.. General Notes: small open wound on the left gluteal area near the  posterior thigh which has some necrotic debris stuck to it and the base of the wound has minimal healthy granulation tissue and there is no surrounding erythema or any evidence of purulence. Integumentary (Hair, Skin) Winograd, Dolora S. ([id number]) No suspicious lesions. No crepitus or fluctuance. No peri-wound warmth or erythema. No masses.. Wound #1 status is Open. Original cause of wound was Gradually Appeared. The wound is located on the Left Gluteal fold. The wound measures 0.9cm length x 0.9cm width x 0.1cm depth; 0.636cm^2 area and 0.064cm^3 volume. There is a large amount of serous drainage noted. The wound margin is distinct with the outline attached to the wound base. There is medium (34-66%) red granulation within the wound bed. There is a medium (34-66%) amount of necrotic tissue within the wound bed including Eschar and Adherent Slough. Periwound temperature was noted as No Abnormality. The periwound has tenderness on palpation. Assessment Active Problems ICD-10 S31.829A - Unspecified open wound of left buttock, initial encounter this 83 year old patient who looks much younger than his stated age has a nonhealing wound on her left gluteal area after an IandD of an abscess. After sharp debridement today I have recommended: 1. Silver alginate and a bordered foam to be changed daily after shower 2. Completed her course of antibiotics 3. Regular visits to the wound center She does not have much help and we have discussed the manner she can do her dressing and return to see as an regular basis Procedures Wound #1 Pre-procedure diagnosis of Wound #1 is an Abscess located on the Left Gluteal fold . There was a Skin/Subcutaneous Tissue Debridement ([id number]) debridement with total area of 0.81 sq cm performed by Christin Fudge, MD. with the following instrument(s): Curette and Forceps to remove Viable and Non-Viable tissue/material including Exudate, Fibrin/Slough, and Subcutaneous  after achieving pain control using Lidocaine 4% Topical Solution. A time out was conducted at 11:00, prior to the start of the procedure. A Minimum amount of bleeding was controlled with Pressure. The procedure was tolerated well with a pain level of 0 throughout and a pain level of 0 following the procedure. Post Debridement Measurements: 0.9cm length x 1cm width x 0.1cm depth; 0.071cm^3 volume. Character of Wound/Ulcer Post Debridement requires further debridement. Post procedure Diagnosis Wound #1: Same as Pre-Procedure Milich, Allyssa S. ([id number]) Plan Wound Cleansing: Wound #1 Left Gluteal fold: Clean wound with Normal Saline. Cleanse wound with mild soap and water May Shower, gently pat wound dry prior to applying new dressing. Anesthetic: Wound #1 Left Gluteal fold: Topical Lidocaine 4% cream applied to wound bed prior to debridement Primary Wound Dressing: Wound #1 Left Gluteal fold: Silvercel Non-Adherent Secondary Dressing: Wound #1 Left Gluteal fold: Non-adherent pad - telfa island Follow-up Appointments: Wound #1 Left Gluteal fold: Return Appointment in 1 week. Off-Loading: Wound #1 Left Gluteal fold: Turn and reposition every 2 hours Additional Orders / Instructions: Wound #1 Left Gluteal fold: Increase protein intake. Medications-please add to medication list.: Wound #1 Left Gluteal fold: Other: - Vitamin C, Zinc, Vitamin A this 83 year old patient who looks much younger than his stated age has a nonhealing wound on her left gluteal area after an IandD of an abscess. After sharp debridement today I have recommended: 1. Silver alginate and  a bordered foam to be changed daily after shower 2. Completed her course of antibiotics 3. Regular visits to the wound center She does not have much help and we have discussed the manner she can do her dressing and return to see as an regular basis Electronic Signature(s) CASHAY, MANGANELLI ([id number]) Signed: 09/25/2017 4:26:49  PM By: Christin Fudge MD, FACS Previous Signature: 09/25/2017 4:26:41 PM Version By: Christin Fudge MD, FACS Previous Signature: 09/25/2017 11:12:05 AM Version By: Christin Fudge MD, FACS Entered By: Christin Fudge on 09/25/2017 16:26:49 TERIE, LEAR ([id number]) -------------------------------------------------------------------------------- ROS/PFSH Details Patient Name: Enis Gash Date of Service: 09/25/2017 10:30 AM Medical Record Number: [id number] Patient Account Number: [id number] Date of Birth/Sex: Apr 22, 1929 (83 y.o. 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, Yes VRE)o Date: 09/18/2017 Have you tested positive for osteomyelitis (bone infection)o No Have you had any tests for circulation on your legso No Have you had other problems associated with your woundso Infection, Swelling Eyes Complaints and Symptoms: Positive for: Glasses / Contacts Medical History: Positive for: Cataracts - surgery Constitutional Symptoms (General Health) Complaints and Symptoms: No Complaints or Symptoms Ear/Nose/Mouth/Throat Complaints and Symptoms: No Complaints or Symptoms Hematologic/Lymphatic Complaints and Symptoms: No Complaints or Symptoms Respiratory Krabill, Stassi S. ([id number]) Complaints and Symptoms: No Complaints or Symptoms Cardiovascular Medical History: Positive  for: Hypertension Gastrointestinal Complaints and Symptoms: No Complaints or Symptoms Endocrine Complaints and Symptoms: No Complaints or Symptoms Genitourinary Complaints and Symptoms: No Complaints or Symptoms Immunological Complaints and Symptoms: No Complaints or Symptoms Musculoskeletal Complaints and Symptoms: No Complaints or Symptoms Neurologic Medical History: Positive for: Neuropathy Oncologic Complaints and Symptoms: Review of System Notes: melanoma Medical History: Negative for: Received Chemotherapy; Received Radiation Psychiatric Complaints and Symptoms: No Complaints or Symptoms Aul, Arcelia S. ([id number]) HBO Extended History Items Eyes: Cataracts Immunizations Pneumococcal Vaccine: Received Pneumococcal Vaccination: Yes Implantable Devices Family and Social History Cancer: Yes - Siblings; Diabetes: No; Heart Disease: No; Hereditary Spherocytosis: No; Hypertension: No; Kidney Disease: No; Lung Disease: No; Seizures: No; Stroke: No; Thyroid Problems: No; Tuberculosis: No; Never smoker; Marital Status - Widowed; Alcohol Use: Never; Drug Use: No History; Caffeine Use: Daily; Financial Concerns: No; Food, Clothing or Shelter Needs: No; Support System Lacking: No; Transportation Concerns: No; Advanced Directives: No; Patient does not want information on Advanced Directives; Do not resuscitate: No; Living Will: Yes (Not Provided); Medical Power of Attorney: Yes - Estil Daft and Valarie Merino (Not Provided) Physician Affirmation I have reviewed and agree with the above information. Electronic Signature(s) Signed: 09/25/2017 4:27:05 PM By: Christin Fudge MD, FACS Signed: 09/25/2017 5:09:28 PM By: Alric Quan Entered By: Christin Fudge on 09/25/2017 10:53:08 [place], [place] ([id number]) -------------------------------------------------------------------------------- SuperBill Details Patient Name: Enis Gash Date of Service: 09/25/2017 Medical Record  Number: [id number] Patient Account Number: [id number] Date of Birth/Sex: 06-Feb-1929 (83 y.o. Female) Treating RN: Carolyne Fiscal, Debi Primary Care Provider: Cranford Mon, Delfino Lovett Other Clinician: Referring Provider: Wilhemena Durie Treating  Provider/Extender: Frann Rider in Treatment: 0 Diagnosis Coding ICD-10 Codes Code Description S31.829A Unspecified open wound of left buttock, initial encounter Facility Procedures CPT4 Code: [id number] Description: [phone] - WOUND CARE VISIT-LEV 2 EST PT Modifier: Quantity: 1 CPT4 Code: [id number] Description: 54656 - DEB SUBQ TISSUE 20 SQ CM/< ICD-10 Description Diagnosis S31.829A Unspecified open wound of left buttock, initial e Modifier: ncounter Quantity: 1 Physician Procedures CPT4 Code: [id number] Description: 00174 - WC PHYS LEVEL 4 - NEW PT ICD-10 Description Diagnosis S31.829A Unspecified open wound of left buttock, initial e Modifier: 25 ncounter Quantity: 1 CPT4 Code: [id number] Description: 91638 - WC PHYS SUBQ TISS 20 SQ CM ICD-10 Description Diagnosis S31.829A Unspecified open wound of left buttock, initial e Modifier: ncounter Quantity: 1 Electronic Signature(s) Signed: 09/25/2017 4:27:05 PM By: Christin Fudge MD, FACS Signed: 09/25/2017 5:09:28 PM By: Alric Quan Previous Signature: 09/25/2017 11:12:20 AM Version By: Christin Fudge MD, FACS Entered By: Alric Quan on 09/25/2017 11:47:44

## 2017-09-26 NOTE — Progress Notes (Signed)
VENISSA, NAPPI ([id number]) Visit Report for 09/25/2017 Allergy List Details Patient Name: Kelly Clark, Kelly Clark Date of Service: 09/25/2017 10:30 AM Medical Record Number: [id number] Patient Account Number: [id number] Date of Birth/Sex: 1929/05/15 (83 y.o. Female) Treating RN: Carolyne Fiscal, Debi Primary Care Terry Bolotin: Cranford Mon, Delfino Lovett Other Clinician: Referring Izayah Miner: Cranford Mon, RICHARD Treating Ralynn San/Extender: Frann Rider in Treatment: 0 Allergies Active Allergies NKDA Allergy Notes Electronic Signature(s) Signed: 09/25/2017 5:09:28 PM By: Alric Quan Entered By: Alric Quan on 09/25/2017 10:34:17 Sak, [name] ([id number]) -------------------------------------------------------------------------------- Arrival Information Details Patient Name: Enis Gash Date of Service: 09/25/2017 10:30 AM Medical Record Number: [id number] Patient Account Number: [id number] Date of Birth/Sex: 06-24-1929 (83 y.o. Female) Treating RN: Carolyne Fiscal, Debi Primary Care Quientin Jent: Cranford Mon, Delfino Lovett Other Clinician: Referring Jeri Rawlins: Cranford Mon, RICHARD Treating Hanako Tipping/Extender: Frann Rider in Treatment: 0 Visit Information Patient Arrived: Ambulatory Arrival Time: 10:29 Accompanied By: daughter Transfer Assistance: None Patient Identification Verified: Yes Secondary Verification Process Yes Completed: Patient Requires Transmission-Based No Precautions: Patient Has Alerts: No Electronic Signature(s) Signed: 09/25/2017 5:09:28 PM By: Alric Quan Entered By: Alric Quan on 09/25/2017 10:31:37 Register, [name] ([id number]) -------------------------------------------------------------------------------- Clinic Level of Care Assessment Details Patient Name: Enis Gash Date of Service: 09/25/2017 10:30 AM Medical Record Number: [id number] Patient Account Number: [id number] Date of Birth/Sex: 1929/07/27 (83 y.o. Female) Treating RN: Carolyne Fiscal,  Debi Primary Care Cobe Viney: Cranford Mon, Delfino Lovett Other Clinician: Referring Senovia Gauer: Cranford Mon, RICHARD Treating Kashawn Dirr/Extender: Frann Rider in Treatment: 0 Clinic Level of Care Assessment Items TOOL 1 Quantity Score X - Use when EandM and Procedure is performed on INITIAL visit 1 0 ASSESSMENTS - Nursing Assessment / Reassessment X - General Physical Exam (combine w/ comprehensive assessment (listed just 1 20 below) when performed on new pt. evals) X - Comprehensive Assessment (HX, ROS, Risk Assessments, Wounds Hx, etc.) 1 25 ASSESSMENTS - Wound and Skin Assessment / Reassessment []  - Dermatologic / Skin Assessment (not related to wound area) 0 ASSESSMENTS - Ostomy and/or Continence Assessment and Care []  - Incontinence Assessment and Management 0 []  - Ostomy Care Assessment and Management (repouching, etc.) 0 PROCESS - Coordination of Care X - Simple Patient / Family Education for ongoing care 1 15 []  - Complex (extensive) Patient / Family Education for ongoing care 0 []  - Staff obtains Programmer, systems, Records, Test Results / Process Orders 0 []  - Staff telephones HHA, Nursing Homes / Clarify orders / etc 0 []  - Routine Transfer to another Facility (non-emergent condition) 0 []  - Routine Hospital Admission (non-emergent condition) 0 X - New Admissions / Biomedical engineer / Ordering NPWT, Apligraf, etc. 1 15 []  - Emergency Hospital Admission (emergent condition) 0 PROCESS - Special Needs []  - Pediatric / Minor Patient Management 0 []  - Isolation Patient Management 0 Boulais, Sela S. ([id number]) []  - Hearing / Language / Visual special needs 0 []  - Assessment of Community assistance (transportation, D/C planning, etc.) 0 []  - Additional assistance / Altered mentation 0 []  - Support Surface(s) Assessment (bed, cushion, seat, etc.) 0 INTERVENTIONS - Miscellaneous []  - External ear exam 0 []  - Patient Transfer (multiple staff / Civil Service fast streamer / Similar devices) 0 []  -  Simple Staple / Suture removal (25 or less) 0 []  - Complex Staple / Suture removal (26 or more) 0 []  - Hypo/Hyperglycemic Management (do not check if billed separately) 0 []  - Ankle / Brachial Index (ABI) - do not check if billed separately 0 Has the patient been seen at the hospital within the last three  years: Yes Total Score: 75 Level Of Care: New/Established - Level 2 Electronic Signature(s) Signed: 09/25/2017 5:09:28 PM By: Alric Quan Entered By: Alric Quan on 09/25/2017 11:47:34 Ed[place]ston, [name] ([id number]) -------------------------------------------------------------------------------- Encounter Discharge Information Details Patient Name: Enis Gash Date of Service: 09/25/2017 10:30 AM Medical Record Number: [id number] Patient Account Number: [id number] Date of Birth/Sex: September 24, 1929 (83 y.o. Female) Treating RN: Carolyne Fiscal, Debi Primary Care Whitney Hillegass: Cranford Mon, Delfino Lovett Other Clinician: Referring Dutchess Crosland: Cranford Mon, RICHARD Treating Malayja Freund/Extender: Frann Rider in Treatment: 0 Encounter Discharge Information Items Discharge Pain Level: 0 Discharge Condition: Stable Ambulatory Status: Ambulatory Discharge Destination: Home Transportation: Private Auto Accompanied By: self Schedule Follow-up Appointment: Yes Medication Reconciliation completed and provided to Patient/Care No Edessa Jakubowicz: Provided on Clinical Summary of Care: 09/25/2017 Form Type Recipient Paper Patient HG Electronic Signature(s) Signed: 09/26/2017 11:04:46 AM By: Ruthine Dose Entered By: Ruthine Dose on 09/25/2017 11:16:50 Skilton, [name] ([id number]) -------------------------------------------------------------------------------- Lower Extremity Assessment Details Patient Name: Enis Gash Date of Service: 09/25/2017 10:30 AM Medical Record Number: [id number] Patient Account Number: [id number] Date of Birth/Sex: [birth year] (83 y.o. Female) Treating RN: Carolyne Fiscal,  Debi Primary Care Gussie Murton: Cranford Mon, Delfino Lovett Other Clinician: Referring Shakena Callari: Cranford Mon, RICHARD Treating Kathlene Yano/Extender: Frann Rider in Treatment: 0 Electronic Signature(s) Signed: 09/25/2017 5:09:28 PM By: Alric Quan Entered By: Alric Quan on 09/25/2017 10:40:25 Knobloch, [name] ([id number]) -------------------------------------------------------------------------------- Multi Wound Chart Details Patient Name: Enis Gash Date of Service: 09/25/2017 10:30 AM Medical Record Number: [id number] Patient Account Number: [id number] Date of Birth/Sex: 1929-09-03 (83 y.o. Female) Treating RN: Carolyne Fiscal, Debi Primary Care Trenese Haft: Cranford Mon, Delfino Lovett Other Clinician: Referring Ahmad Vanwey: Cranford Mon, RICHARD Treating Dalaina Tates/Extender: Frann Rider in Treatment: 0 Vital Signs Height(in): 62 Pulse(bpm): 67 Weight(lbs): 178.6 Blood Pressure 159/67 (mmHg): Body Mass Index(BMI): 33 Temperature(F): 97.9 Respiratory Rate 18 (breaths/min): Photos: [1:No Photos] [N/A:N/A] Wound Location: [1:Left Gluteal fold] [N/A:N/A] Wounding Event: [1:Gradually Appeared] [N/A:N/A] Primary Etiology: [1:Abscess] [N/A:N/A] Comorbid History: [1:Cataracts, Hypertension, Neuropathy] [N/A:N/A] Date Acquired: [1:08/26/2017] [N/A:N/A] Weeks of Treatment: [1:0] [N/A:N/A] Wound Status: [1:Open] [N/A:N/A] Measurements L x W x D 0.9x0.9x0.1 [N/A:N/A] (cm) Area (cm) : [1:0.636] [N/A:N/A] Volume (cm) : [1:0.064] [N/A:N/A] Classification: [1:Partial Thickness] [N/A:N/A] Exudate Amount: [1:Large] [N/A:N/A] Exudate Type: [1:Serous] [N/A:N/A] Exudate Color: [1:amber] [N/A:N/A] Wound Margin: [1:Distinct, outline attached] [N/A:N/A] Granulation Amount: [1:Medium (34-66%)] [N/A:N/A] Granulation Quality: [1:Red] [N/A:N/A] Necrotic Amount: [1:Medium (34-66%)] [N/A:N/A] Necrotic Tissue: [1:Eschar, Adherent Slough] [N/A:N/A] Debridement: [1:Debridement ([id number])]  [N/A:N/A] Pre-procedure [1:11:00] [N/A:N/A] Verification/Time Out Taken: Pain Control: [1:Lidocaine 4% Topical Solution] [N/A:N/A] Tissue Debrided: [N/A:N/A] Fibrin/Slough, Exudates, Subcutaneous Level: Skin/Subcutaneous N/A N/A Tissue Debridement Area (sq 0.81 N/A N/A cm): Instrument: Curette, Forceps N/A N/A Bleeding: Minimum N/A N/A Hemostasis Achieved: Pressure N/A N/A Procedural Pain: 0 N/A N/A Post Procedural Pain: 0 N/A N/A Debridement Treatment Procedure was tolerated N/A N/A Response: well Post Debridement 0.9x1x0.1 N/A N/A Measurements L x W x D (cm) Post Debridement 0.071 N/A N/A Volume: (cm) Periwound Skin Texture: No Abnormalities Noted N/A N/A Periwound Skin No Abnormalities Noted N/A N/A Moisture: Periwound Skin Color: No Abnormalities Noted N/A N/A Temperature: No Abnormality N/A N/A Tenderness on Yes N/A N/A Palpation: Wound Preparation: Ulcer Cleansing: N/A N/A Rinsed/Irrigated with Saline Topical Anesthetic Applied: Other: lidocaine 4% Procedures Performed: Debridement N/A N/A Treatment Notes Electronic Signature(s) Signed: 09/25/2017 11:08:30 AM By: Christin Fudge MD, FACS Entered By: Christin Fudge on 09/25/2017 11:08:30 SELENE, PELTZER ([id number]) -------------------------------------------------------------------------------- Multi-Disciplinary Care Plan Details Patient Name: Enis Gash Date of Service: 09/25/2017 10:30 AM Medical Record Number: [id number]  Patient Account Number: [id number] Date of Birth/Sex: 06-06-1929 (83 y.o. Female) Treating RN: Carolyne Fiscal, Debi Primary Care Rayshaun Needle: Cranford Mon, Delfino Lovett Other Clinician: Referring Minal Stuller: Cranford Mon, RICHARD Treating Carma Dwiggins/Extender: Frann Rider in Treatment: 0 Active Inactive ` Nutrition Nursing Diagnoses: Potential for alteratiion in Nutrition/Potential for imbalanced nutrition Goals: Patient/caregiver agrees to and verbalizes understanding of need to use  nutritional supplements and/or vitamins as prescribed Date Initiated: 09/25/2017 Target Resolution Date: 01/27/2018 Goal Status: Active Interventions: Assess patient nutrition upon admission and as needed per policy Notes: ` Orientation to the Wound Care Program Nursing Diagnoses: Knowledge deficit related to the wound healing center program Goals: Patient/caregiver will verbalize understanding of the [place] Date Initiated: 09/25/2017 Target Resolution Date: 10/28/2017 Goal Status: Active Interventions: Provide education on orientation to the wound center Notes: ` Pain, Acute or Chronic Nursing Diagnoses: Pain, acute or chronic: actual or potential SHANITRA, PHILLIPPI. ([id number]) Potential alteration in comfort, pain Goals: Patient/caregiver will verbalize adequate pain control between visits Date Initiated: 09/25/2017 Target Resolution Date: 01/27/2018 Goal Status: Active Interventions: Complete pain assessment as per visit requirements Notes: ` Wound/Skin Impairment Nursing Diagnoses: Impaired tissue integrity Knowledge deficit related to ulceration/compromised skin integrity Goals: Ulcer/skin breakdown will have a volume reduction of 80% by week 12 Date Initiated: 09/25/2017 Target Resolution Date: 01/20/2018 Goal Status: Active Interventions: Assess patient/caregiver ability to perform ulcer/skin care regimen upon admission and as needed Notes: Electronic Signature(s) Signed: 09/25/2017 5:09:28 PM By: Alric Quan Entered By: Alric Quan on 09/25/2017 10:53:38 Altieri, [name] ([id number]) -------------------------------------------------------------------------------- Pain Assessment Details Patient Name: Enis Gash Date of Service: 09/25/2017 10:30 AM Medical Record Number: [id number] Patient Account Number: [id number] Date of Birth/Sex: 11/09/1929 (83 y.o. Female) Treating RN: Carolyne Fiscal, Debi Primary Care Esme Durkin: Cranford Mon,  Delfino Lovett Other Clinician: Referring Tyra Gural: Cranford Mon, RICHARD Treating Jasiel Apachito/Extender: Frann Rider in Treatment: 0 Active Problems Location of Pain Severity and Description of Pain Patient Has Paino Yes Site Locations Pain Location: Pain in Ulcers Character of Pain Describe the Pain: Aching, Burning, Tender, Throbbing Pain Management and Medication Current Pain Management: Notes only when sitting Electronic Signature(s) Signed: 09/25/2017 5:09:28 PM By: Alric Quan Entered By: Alric Quan on 09/25/2017 10:32:02 Enis Gash ([id number]) -------------------------------------------------------------------------------- Patient/Caregiver Education Details Patient Name: Enis Gash Date of Service: 09/25/2017 10:30 AM Medical Record Number: [id number] Patient Account Number: [id number] Date of Birth/Gender: Oct 12, 1929 (83 y.o. Female) Treating RN: Carolyne Fiscal, Debi Primary Care Physician: Wilhemena Durie Other Clinician: Referring Physician: Wilhemena Durie Treating Physician/Extender: Frann Rider in Treatment: 0 Education Assessment Education Provided To: Patient Education Topics Provided Welcome To [facility]: Handouts: Welcome To [facility] Methods: Explain/Verbal Responses: State content correctly Wound/Skin Impairment: Handouts: Other: change dressing as ordered Methods: Demonstration, Explain/Verbal Responses: State content correctly Electronic Signature(s) Signed: 09/25/2017 5:09:28 PM By: Alric Quan Entered By: Alric Quan on 09/25/2017 10:54:25 Trick, [name] ([id number]) -------------------------------------------------------------------------------- Wound Assessment Details Patient Name: Enis Gash Date of Service: 09/25/2017 10:30 AM Medical Record Number: [id number] Patient Account Number: [id number] Date of Birth/Sex: 10/29/1929 (83 y.o. Female) Treating RN: Carolyne Fiscal,  Debi Primary Care Kunal Levario: Cranford Mon, Delfino Lovett Other Clinician: Referring Kolbi Altadonna: Cranford Mon, RICHARD Treating Shaunna Rosetti/Extender: Frann Rider in Treatment: 0 Wound Status Wound Number: 1 Primary Etiology: Abscess Wound Location: Left Gluteal fold Wound Status: Open Wounding Event: Gradually Appeared Comorbid Cataracts, Hypertension, History: Neuropathy Date Acquired: 08/26/2017 Weeks Of Treatment: 0 Clustered Wound: No Photos Photo Uploaded By: Alric Quan on 09/25/2017 11:51:20 Wound  Measurements Length: (cm) Width: (cm) Depth: (cm) Area: (cm) Volume: (cm) 0.9 % Reduction in Area: 0.9 % Reduction in Volume: 0.1 0.636 0.064 Wound Description Classification: Partial Thickness Foul Odor Aft Wound Margin: Distinct, outline attached Slough/Fibrin Exudate Amount: Large Exudate Type: Serous Exudate Color: amber er Cleansing: No o Yes Wound Bed Granulation Amount: Medium (34-66%) Granulation Quality: Red Necrotic Amount: Medium (34-66%) Necrotic Quality: Eschar, Adherent [address] [place]. ([id number]) Periwound Skin Texture Texture Color No Abnormalities Noted: No No Abnormalities Noted: No Moisture Temperature / Pain No Abnormalities Noted: No Temperature: No Abnormality Tenderness on Palpation: Yes Wound Preparation Ulcer Cleansing: Rinsed/Irrigated with Saline Topical Anesthetic Applied: Other: lidocaine 4%, Treatment Notes Wound #1 (Left Gluteal fold) 1. Cleansed with: Clean wound with Normal Saline 2. Anesthetic Topical Lidocaine 4% cream to wound bed prior to debridement 4. Dressing Applied: Other dressing (specify in notes) 5. Secondary [place] Notes silvercel Electronic Signature(s) Signed: 09/25/2017 5:09:28 PM By: Alric Quan Entered By: Alric Quan on 09/25/2017 10:47:22 Bebeau, [name] ([id number]) -------------------------------------------------------------------------------- Vitals  Details Patient Name: Enis Gash Date of Service: 09/25/2017 10:30 AM Medical Record Number: [id number] Patient Account Number: [id number] Date of Birth/Sex: 07-19-1929 (83 y.o. Female) Treating RN: Carolyne Fiscal, Debi Primary Care Shatera Rennert: Cranford Mon, Delfino Lovett Other Clinician: Referring Jamilynn Whitacre: Cranford Mon, RICHARD Treating Lenah Messenger/Extender: Frann Rider in Treatment: 0 Vital Signs Time Taken: 10:32 Temperature (F): 97.9 Height (in): 62 Pulse (bpm): 67 Source: Stated Respiratory Rate (breaths/min): 18 Weight (lbs): 178.6 Blood Pressure (mmHg): 159/67 Source: Measured Reference Range: 80 - 120 mg / dl Body Mass Index (BMI): 32.7 Electronic Signature(s) Signed: 09/25/2017 5:09:28 PM By: Alric Quan Entered By: Alric Quan on 09/25/2017 10:33:45

## 2017-09-27 DIAGNOSIS — H40153 Residual stage of open-angle glaucoma, bilateral: Secondary | ICD-10-CM | POA: Diagnosis not present

## 2017-10-06 ENCOUNTER — Encounter: Payer: PPO | Admitting: Surgery

## 2017-10-06 DIAGNOSIS — S31829A Unspecified open wound of left buttock, initial encounter: Secondary | ICD-10-CM | POA: Diagnosis not present

## 2017-10-09 NOTE — Progress Notes (Signed)
Kelly Clark, Kelly Clark ([id number]) Visit Report for 10/06/2017 Arrival Information Details Patient Name: Kelly Clark, Kelly Clark Date of Service: 10/06/2017 9:45 AM Medical Record Number: [id number] Patient Account Number: [id number] Date of Birth/Sex: [birth year] (83 y.o. Female) Treating RN: Carolyne Fiscal, Debi Primary Care Andy Moye: Cranford Mon, Delfino Lovett Other Clinician: Referring Kayshaun Polanco: Cranford Mon, RICHARD Treating Teagan Heidrick/Extender: Frann Rider in Treatment: 1 Visit Information History Since Last Visit All ordered tests and consults were completed: No Patient Arrived: Ambulatory Added or deleted any medications: No Arrival Time: 09:41 Any new allergies or adverse reactions: No Accompanied By: self Had a fall or experienced change in No Transfer Assistance: None activities of daily living that may affect Patient Identification Verified: Yes risk of falls: Secondary Verification Process Completed: Yes Signs or symptoms of abuse/neglect since last visito No Patient Requires Transmission-Based No Hospitalized since last visit: No Precautions: Has Dressing in Place as Prescribed: Yes Patient Has Alerts: No Pain Present Now: No Electronic Signature(s) Signed: 10/06/2017 4:20:42 PM By: Alric Quan Entered By: Alric Quan on 10/06/2017 09:42:44 Kelly Clark, Kelly Clark ([id number]) -------------------------------------------------------------------------------- Clinic Level of Care Assessment Details Patient Name: Kelly Clark Date of Service: 10/06/2017 9:45 AM Medical Record Number: [id number] Patient Account Number: [id number] Date of Birth/Sex: Nov 27, 1929 (83 y.o. Female) Treating RN: Carolyne Fiscal, Debi Primary Care Oda Placke: Cranford Mon, Delfino Lovett Other Clinician: Referring Emmauel Hallums: Cranford Mon, RICHARD Treating Kabrea Seeney/Extender: Frann Rider in Treatment: 1 Clinic Level of Care Assessment Items TOOL 4 Quantity Score X - Use when only an EandM is performed on FOLLOW-UP visit  1 0 ASSESSMENTS - Nursing Assessment / Reassessment X - Reassessment of Co-morbidities (includes updates in patient status) 1 10 X- 1 5 Reassessment of Adherence to Treatment Plan ASSESSMENTS - Wound and Skin Assessment / Reassessment X - Simple Wound Assessment / Reassessment - one wound 1 5 []  - 0 Complex Wound Assessment / Reassessment - multiple wounds []  - 0 Dermatologic / Skin Assessment (not related to wound area) ASSESSMENTS - Focused Assessment []  - Circumferential Edema Measurements - multi extremities 0 []  - 0 Nutritional Assessment / Counseling / Intervention []  - 0 Lower Extremity Assessment (monofilament, tuning fork, pulses) []  - 0 Peripheral Arterial Disease Assessment (using hand held doppler) ASSESSMENTS - Ostomy and/or Continence Assessment and Care []  - Incontinence Assessment and Management 0 []  - 0 Ostomy Care Assessment and Management (repouching, etc.) PROCESS - Coordination of Care X - Simple Patient / Family Education for ongoing care 1 15 []  - 0 Complex (extensive) Patient / Family Education for ongoing care []  - 0 Staff obtains Programmer, systems, Records, Test Results / Process Orders []  - 0 Staff telephones HHA, Nursing Homes / Clarify orders / etc []  - 0 Routine Transfer to another Facility (non-emergent condition) []  - 0 Routine Hospital Admission (non-emergent condition) []  - 0 New Admissions / Biomedical engineer / Ordering NPWT, Apligraf, etc. []  - 0 Emergency Hospital Admission (emergent condition) X- 1 10 Simple Discharge Coordination Kelly Clark, Kelly S. ([id number]) []  - 0 Complex (extensive) Discharge Coordination PROCESS - Special Needs []  - Pediatric / Minor Patient Management 0 []  - 0 Isolation Patient Management []  - 0 Hearing / Language / Visual special needs []  - 0 Assessment of Community assistance (transportation, D/C planning, etc.) []  - 0 Additional assistance / Altered mentation []  - 0 Support Surface(s) Assessment (bed,  cushion, seat, etc.) INTERVENTIONS - Wound Cleansing / Measurement X - Simple Wound Cleansing - one wound 1 5 []  - 0 Complex Wound Cleansing - multiple wounds X- 1 5 Wound  Imaging (photographs - any number of wounds) []  - 0 Wound Tracing (instead of photographs) X- 1 5 Simple Wound Measurement - one wound []  - 0 Complex Wound Measurement - multiple wounds INTERVENTIONS - Wound Dressings X - Small Wound Dressing one or multiple wounds 1 10 []  - 0 Medium Wound Dressing one or multiple wounds []  - 0 Large Wound Dressing one or multiple wounds X- 1 5 Application of Medications - topical []  - 0 Application of Medications - injection INTERVENTIONS - Miscellaneous []  - External ear exam 0 []  - 0 Specimen Collection (cultures, biopsies, blood, body fluids, etc.) []  - 0 Specimen(s) / Culture(s) sent or taken to Lab for analysis []  - 0 Patient Transfer (multiple staff / Civil Service fast streamer / Similar devices) []  - 0 Simple Staple / Suture removal (25 or less) []  - 0 Complex Staple / Suture removal (26 or more) []  - 0 Hypo / Hyperglycemic Management (close monitor of Blood Glucose) []  - 0 Ankle / Brachial Index (ABI) - do not check if billed separately X- 1 5 Vital Signs Kelly Clark, Kelly S. ([id number]) Has the patient been seen at the hospital within the last three years: Yes Total Score: 80 Level Of Care: New/Established - Level 3 Electronic Signature(s) Signed: 10/06/2017 4:20:42 PM By: Alric Quan Entered By: Alric Quan on 10/06/2017 10:27:07 Kelly Clark ([id number]) -------------------------------------------------------------------------------- Encounter Discharge Information Details Patient Name: Kelly Clark Date of Service: 10/06/2017 9:45 AM Medical Record Number: [id number] Patient Account Number: [id number] Date of Birth/Sex: [birth year] (83 y.o. Female) Treating RN: Carolyne Fiscal, Debi Primary Care Zymarion Favorite: Cranford Mon, Delfino Lovett Other Clinician: Referring  Aubreanna Percle: Cranford Mon, RICHARD Treating Kallyn Demarcus/Extender: Frann Rider in Treatment: 1 Encounter Discharge Information Items Discharge Pain Level: 0 Discharge Condition: Stable Ambulatory Status: Ambulatory Discharge Destination: Home Transportation: Private Auto Accompanied By: self Schedule Follow-up Appointment: Yes Medication Reconciliation completed and No provided to Patient/Care Aadin Gaut: Provided on Clinical Summary of Care: 10/06/2017 Form Type Recipient Paper Patient HG Electronic Signature(s) Signed: 10/09/2017 9:18:03 AM By: Ruthine Dose Entered By: Ruthine Dose on 10/06/2017 10:07:12 Kelly Clark, Kelly Clark ([id number]) -------------------------------------------------------------------------------- Lower Extremity Assessment Details Patient Name: Kelly Clark Date of Service: 10/06/2017 9:45 AM Medical Record Number: [id number] Patient Account Number: [id number] Date of Birth/Sex: 1929/01/11 (83 y.o. Female) Treating RN: Carolyne Fiscal, Debi Primary Care Thera Basden: Cranford Mon, Delfino Lovett Other Clinician: Referring Lenford Beddow: Cranford Mon, RICHARD Treating Ariel Wingrove/Extender: Frann Rider in Treatment: 1 Electronic Signature(s) Signed: 10/06/2017 4:20:42 PM By: Alric Quan Entered By: Alric Quan on 10/06/2017 09:49:48 Kelly Clark, Kelly Clark ([id number]) -------------------------------------------------------------------------------- Multi Wound Chart Details Patient Name: Kelly Clark Date of Service: 10/06/2017 9:45 AM Medical Record Number: [id number] Patient Account Number: [id number] Date of Birth/Sex: 02/05/1929 (83 y.o. Female) Treating RN: Carolyne Fiscal, Debi Primary Care Charene Mccallister: Cranford Mon, Delfino Lovett Other Clinician: Referring Deiondre Harrower: Cranford Mon, RICHARD Treating Draiden Mirsky/Extender: Frann Rider in Treatment: 1 Vital Signs Height(in): 62 Pulse(bpm): 75 Weight(lbs): 178.6 Blood Pressure(mmHg): 144/75 Body Mass Index(BMI):  33 Temperature(F): 97.9 Respiratory Rate 18 (breaths/min): Photos: [1:No Photos] [N/A:N/A] Wound Location: [1:Left Gluteal fold] [N/A:N/A] Wounding Event: [1:Gradually Appeared] [N/A:N/A] Primary Etiology: [1:Abscess] [N/A:N/A] Comorbid History: [1:Cataracts, Hypertension, Neuropathy] [N/A:N/A] Date Acquired: [1:08/26/2017] [N/A:N/A] Weeks of Treatment: [1:1] [N/A:N/A] Wound Status: [1:Open] [N/A:N/A] Measurements L x W x D [1:0.4x0.3x0.1] [N/A:N/A] (cm) Area (cm) : [1:0.094] [N/A:N/A] Volume (cm) : [1:0.009] [N/A:N/A] % Reduction in Area: [1:85.20%] [N/A:N/A] % Reduction in Volume: [1:85.90%] [N/A:N/A] Classification: [1:Partial Thickness] [N/A:N/A] Exudate Amount: [1:Large] [N/A:N/A] Exudate Type: [1:Serous] [N/A:N/A] Exudate Color: [1:amber] [N/A:N/A] Wound Margin: [1:Distinct,  outline attached] [N/A:N/A] Granulation Amount: [1:Large (67-100%)] [N/A:N/A] Granulation Quality: [1:Red] [N/A:N/A] Necrotic Amount: [1:Small (1-33%)] [N/A:N/A] Epithelialization: [1:Medium (34-66%)] [N/A:N/A] Periwound Skin Texture: [1:No Abnormalities Noted] [N/A:N/A] Periwound Skin Moisture: [1:Maceration: Yes] [N/A:N/A] Periwound Skin Color: [1:No Abnormalities Noted] [N/A:N/A] Temperature: [1:No Abnormality] [N/A:N/A] Tenderness on Palpation: [1:Yes] [N/A:N/A] Wound Preparation: [1:Ulcer Cleansing: Rinsed/Irrigated with Saline] [N/A:N/A] Topical Anesthetic Applied: Other: lidocaine 4% Treatment Notes Kelly Clark, Kelly S. ([id number]) Wound #1 (Left Gluteal fold) 1. Cleansed with: Clean wound with Normal Saline 2. Anesthetic Topical Lidocaine 4% cream to wound bed prior to debridement 4. Dressing Applied: Other dressing (specify in notes) Notes silvercel, coverlet Electronic Signature(s) Signed: 10/06/2017 10:23:44 AM By: Christin Fudge MD, FACS Entered By: Christin Fudge on 10/06/2017 10:23:43 Kelly Clark, Kelly Clark  ([id number]) -------------------------------------------------------------------------------- Multi-Disciplinary Care Plan Details Patient Name: Kelly Clark Date of Service: 10/06/2017 9:45 AM Medical Record Number: [id number] Patient Account Number: [id number] Date of Birth/Sex: 1929/05/28 (83 y.o. Female) Treating RN: Carolyne Fiscal, Debi Primary Care Shericka Johnstone: Cranford Mon, Delfino Lovett Other Clinician: Referring Tor Tsuda: Cranford Mon, RICHARD Treating Rigoberto Repass/Extender: Frann Rider in Treatment: 1 Active Inactive ` Nutrition Nursing Diagnoses: Potential for alteratiion in Nutrition/Potential for imbalanced nutrition Goals: Patient/caregiver agrees to and verbalizes understanding of need to use nutritional supplements and/or vitamins as prescribed Date Initiated: 09/25/2017 Target Resolution Date: 01/27/2018 Goal Status: Active Interventions: Assess patient nutrition upon admission and as needed per policy Notes: ` Orientation to the Wound Care Program Nursing Diagnoses: Knowledge deficit related to the wound healing center program Goals: Patient/caregiver will verbalize understanding of the [facility] Date Initiated: 09/25/2017 Target Resolution Date: 10/28/2017 Goal Status: Active Interventions: Provide education on orientation to the wound center Notes: ` Pain, Acute or Chronic Nursing Diagnoses: Pain, acute or chronic: actual or potential Potential alteration in comfort, pain Goals: Patient/caregiver will verbalize adequate pain control between visits Date Initiated: 09/25/2017 Target Resolution Date: 01/27/2018 Goal Status: Active ARLINDA, BARCELONA ([id number]) Interventions: Complete pain assessment as per visit requirements Notes: ` Wound/Skin Impairment Nursing Diagnoses: Impaired tissue integrity Knowledge deficit related to ulceration/compromised skin integrity Goals: Ulcer/skin breakdown will have a volume reduction of 80% by week 12 Date  Initiated: 09/25/2017 Target Resolution Date: 01/20/2018 Goal Status: Active Interventions: Assess patient/caregiver ability to perform ulcer/skin care regimen upon admission and as needed Notes: Electronic Signature(s) Signed: 10/06/2017 4:20:42 PM By: Alric Quan Entered By: Alric Quan on 10/06/2017 09:49:58 Kelly Clark, Kelly Clark ([id number]) -------------------------------------------------------------------------------- Pain Assessment Details Patient Name: Kelly Clark Date of Service: 10/06/2017 9:45 AM Medical Record Number: [id number] Patient Account Number: [id number] Date of Birth/Sex: 1929-01-07 (83 y.o. Female) Treating RN: Carolyne Fiscal, Debi Primary Care Crockett Rallo: Cranford Mon, Delfino Lovett Other Clinician: Referring Siennah Barrasso: Cranford Mon, RICHARD Treating Destan Franchini/Extender: Frann Rider in Treatment: 1 Active Problems Location of Pain Severity and Description of Pain Patient Has Paino No Site Locations Pain Management and Medication Current Pain Management: Electronic Signature(s) Signed: 10/06/2017 4:20:42 PM By: Alric Quan Entered By: Alric Quan on 10/06/2017 09:42:50 Kelly Clark, Kelly Clark ([id number]) -------------------------------------------------------------------------------- Patient/Caregiver Education Details Patient Name: Kelly Clark Date of Service: 10/06/2017 9:45 AM Medical Record Number: [id number] Patient Account Number: [id number] Date of Birth/Gender: 1929-09-08 (83 y.o. Female) Treating RN: Carolyne Fiscal, Debi Primary Care Physician: Cranford Mon, Delfino Lovett Other Clinician: Referring Physician: Cranford Mon, Delfino Lovett Treating Physician/Extender: Frann Rider in Treatment: 1 Education Assessment Education Provided To: Patient Education Topics Provided Wound/Skin Impairment: Handouts: Other: change dressing as ordered Methods: Demonstration, Explain/Verbal Responses: State content correctly Electronic Signature(s) Signed: 10/06/2017  4:20:42 PM By: Alric Quan Entered  By: Alric Quan on 10/06/2017 09:59:17 Kelly Clark, Kelly Clark ([id number]) -------------------------------------------------------------------------------- Wound Assessment Details Patient Name: FILICIA, SCOGIN Date of Service: 10/06/2017 9:45 AM Medical Record Number: [id number] Patient Account Number: [id number] Date of Birth/Sex: March 05, 1929 (83 y.o. Female) Treating RN: Carolyne Fiscal, Debi Primary Care Mauriah Mcmillen: Cranford Mon, Delfino Lovett Other Clinician: Referring Timithy Arons: Cranford Mon, RICHARD Treating Jasreet Dickie/Extender: Frann Rider in Treatment: 1 Wound Status Wound Number: 1 Primary Etiology: Abscess Wound Location: Left Gluteal fold Wound Status: Open Wounding Event: Gradually Appeared Comorbid History: Cataracts, Hypertension, Neuropathy Date Acquired: 08/26/2017 Weeks Of Treatment: 1 Clustered Wound: No Photos Photo Uploaded By: Alric Quan on 10/06/2017 16:10:55 Wound Measurements Length: (cm) 0.4 Width: (cm) 0.3 Depth: (cm) 0.1 Area: (cm) 0.094 Volume: (cm) 0.009 % Reduction in Area: 85.2% % Reduction in Volume: 85.9% Epithelialization: Medium (34-66%) Tunneling: No Undermining: No Wound Description Classification: Partial Thickness Wound Margin: Distinct, outline attached Exudate Amount: Large Exudate Type: Serous Exudate Color: amber Foul Odor After Cleansing: No Slough/Fibrino Yes Wound Bed Granulation Amount: Large (67-100%) Granulation Quality: Red Necrotic Amount: Small (1-33%) Necrotic Quality: Adherent Slough Periwound Skin Texture Texture Color No Abnormalities Noted: No No Abnormalities Noted: No Moisture Temperature / Pain Crudup, Nikesha S. ([id number]) No Abnormalities Noted: No Temperature: No Abnormality Maceration: Yes Tenderness on Palpation: Yes Wound Preparation Ulcer Cleansing: Rinsed/Irrigated with Saline Topical Anesthetic Applied: Other: lidocaine 4%, Treatment Notes Wound #1 (Left  Gluteal fold) 1. Cleansed with: Clean wound with Normal Saline 2. Anesthetic Topical Lidocaine 4% cream to wound bed prior to debridement 4. Dressing Applied: Other dressing (specify in notes) Notes silvercel, coverlet Electronic Signature(s) Signed: 10/06/2017 4:20:42 PM By: Alric Quan Entered By: Alric Quan on 10/06/2017 09:49:38 [place], Kelly Clark ([id number]) -------------------------------------------------------------------------------- Vitals Details Patient Name: Kelly Clark Date of Service: 10/06/2017 9:45 AM Medical Record Number: [id number] Patient Account Number: [id number] Date of Birth/Sex: 11-07-1929 (83 y.o. Female) Treating RN: Carolyne Fiscal, Debi Primary Care Avon Molock: Cranford Mon, Delfino Lovett Other Clinician: Referring Tulani Kidney: Cranford Mon, RICHARD Treating Makinna Andy/Extender: Frann Rider in Treatment: 1 Vital Signs Time Taken: 09:42 Temperature (F): 97.9 Height (in): 62 Pulse (bpm): 75 Weight (lbs): 178.6 Respiratory Rate (breaths/min): 18 Body Mass Index (BMI): 32.7 Blood Pressure (mmHg): 144/75 Reference Range: 80 - 120 mg / dl Electronic Signature(s) Signed: 10/06/2017 4:20:42 PM By: Alric Quan Entered By: Alric Quan on 10/06/2017 09:43:13

## 2017-10-09 NOTE — Progress Notes (Signed)
VIHA, KRIEGEL ([id number]) Visit Report for 10/06/2017 Chief Complaint Document Details Patient Name: Kelly Clark, Kelly Clark Date of Service: 10/06/2017 9:45 AM Medical Record Number: [id number] Patient Account Number: [id number] Date of Birth/Sex: [birth year] (83 y.o. Female) Treating RN: Carolyne Fiscal, Debi Primary Care Provider: Cranford Mon, Delfino Lovett Other Clinician: Referring Provider: Cranford Mon, Delfino Lovett Treating Provider/Extender: Frann Rider in Treatment: 1 Information Obtained from: Patient Chief Complaint Patient presents to the wound care center with open non-healing surgical wound(s) to the left buttock which she's had for about 6 weeks Electronic Signature(s) Signed: 10/06/2017 10:23:50 AM By: Christin Fudge MD, FACS Entered By: Christin Fudge on 10/06/2017 10:23:49 KLAIR, LEISING ([id number]) -------------------------------------------------------------------------------- HPI Details Patient Name: Kelly Clark Date of Service: 10/06/2017 9:45 AM Medical Record Number: [id number] Patient Account Number: [id number] Date of Birth/Sex: Oct 20, 1929 (83 y.o. Female) Treating RN: Carolyne Fiscal, Debi Primary Care Provider: Cranford Mon, Delfino Lovett Other Clinician: Referring Provider: Cranford Mon, RICHARD Treating Provider/Extender: Frann Rider in Treatment: 1 History of Present Illness Location: left gluteal and upper thigh Quality: Patient reports experiencing a dull pain to affected area(s). Severity: Patient states wound (s) are getting better. Duration: Patient has had the wound for < 6 weeks prior to presenting for treatment Timing: Pain in wound is Intermittent (comes and goes Context: The wound occurred when the patient had an abscess which had to be drained in the urgent care Modifying Factors: Other treatment(s) tried include:clindamycin and Bactrim Associated Signs and Symptoms: Patient reports having:no pain or drainage HPI Description: 83 year old patient was seen by her  PCP Dr. Miguel Aschoff for an abscess on her buttock. She was treated with Bactrim orally and Bactroban ointment locally. She was diagnosed with the abscess about 6 weeks ago and had been treated at the urgent care with IandD and local care at that stage. He now continues to get better but was here for an opinion. 10/06/2017 -- the patient is having some problems with the bordered foam she is applying this area and has had some tape burns causing her some discomfort and distress Electronic Signature(s) Signed: 10/06/2017 10:24:12 AM By: Christin Fudge MD, FACS Entered By: Christin Fudge on 10/06/2017 10:24:11 NANCE, MCCOMBS ([id number]) -------------------------------------------------------------------------------- Physical Exam Details Patient Name: Kelly Clark Date of Service: 10/06/2017 9:45 AM Medical Record Number: [id number] Patient Account Number: [id number] Date of Birth/Sex: [birth year] (83 y.o. Female) Treating RN: Carolyne Fiscal, Debi Primary Care Provider: Wilhemena Durie Other Clinician: Referring Provider: Cranford Mon, RICHARD Treating Provider/Extender: Frann Rider in Treatment: 1 Constitutional . Pulse regular. Respirations normal and unlabored. Afebrile. . Eyes Nonicteric. Reactive to light. Ears, Nose, Mouth, and Throat Lips, teeth, and gums WNL.Marland Kitchen Moist mucosa without lesions. Neck supple and nontender. No palpable supraclavicular or cervical adenopathy. Normal sized without goiter. Respiratory WNL. No retractions.. Breath sounds WNL, No rubs, rales, rhonchi, or wheeze.. Cardiovascular Heart rhythm and rate regular, no murmur or gallop.. Pedal Pulses WNL. No clubbing, cyanosis or edema. Chest Breasts symmetical and no nipple discharge.. Breast tissue WNL, no masses, lumps, or tenderness.. Lymphatic No adneopathy. No adenopathy. No adenopathy. Musculoskeletal Adexa without tenderness or enlargement.. Digits and nails w/o clubbing, cyanosis, infection,  petechiae, ischemia, or inflammatory conditions.. Integumentary (Hair, Skin) No suspicious lesions. No crepitus or fluctuance. No peri-wound warmth or erythema. No masses.Marland Kitchen Psychiatric Judgement and insight Intact.. No evidence of depression, anxiety, or agitation.. Notes the wound on the left gluteal area is looking clean today and there was no necrotic debris to be removed. She has some reaction to  the tape surrounding the wound and I have requested she stop using the bordered foam and use a small Band-Aid instead Electronic Signature(s) Signed: 10/06/2017 10:24:43 AM By: Christin Fudge MD, FACS Entered By: Christin Fudge on 10/06/2017 10:24:43 Kelly Clark, Kelly Clark ([id number]) -------------------------------------------------------------------------------- Physician Orders Details Patient Name: Kelly Clark Date of Service: 10/06/2017 9:45 AM Medical Record Number: [id number] Patient Account Number: [id number] Date of Birth/Sex: November 24, 1929 (83 y.o. Female) Treating RN: Carolyne Fiscal, Debi Primary Care Provider: Cranford Mon, Delfino Lovett Other Clinician: Referring Provider: Cranford Mon, RICHARD Treating Provider/Extender: Frann Rider in Treatment: 1 Verbal / Phone Orders: Yes Clinician: Pinkerton, Debi Read Back and Verified: Yes Diagnosis Coding Wound Cleansing Wound #1 Left Gluteal fold o Clean wound with Normal Saline. o Cleanse wound with mild soap and water o May Shower, gently pat wound dry prior to applying new dressing. Anesthetic Wound #1 Left Gluteal fold o Topical Lidocaine 4% cream applied to wound bed prior to debridement Primary Wound Dressing Wound #1 Left Gluteal fold o Silvercel Non-Adherent Secondary Dressing Wound #1 Left Gluteal fold o Other - coverlet (band-aide) Follow-up Appointments Wound #1 Left Gluteal fold o Return Appointment in 1 week. Off-Loading Wound #1 Left Gluteal fold o Turn and reposition every 2 hours Additional Orders /  Instructions Wound #1 Left Gluteal fold o Increase protein intake. Medications-please add to medication list. Wound #1 Left Gluteal fold o Other: - Vitamin C, Zinc, Vitamin A Electronic Signature(s) Signed: 10/06/2017 12:29:29 PM By: Christin Fudge MD, FACS Signed: 10/06/2017 4:20:42 PM By: Alric Quan Entered By: Alric Quan on 10/06/2017 10:00:43 Kelly Clark, Kelly Clark ([id number]) Kelly Clark, Kelly Clark ([id number]) -------------------------------------------------------------------------------- Problem List Details Patient Name: Kelly Clark Date of Service: 10/06/2017 9:45 AM Medical Record Number: [id number] Patient Account Number: [id number] Date of Birth/Sex: 07-14-1929 (83 y.o. Female) Treating RN: Carolyne Fiscal, Debi Primary Care Provider: Cranford Mon, Delfino Lovett Other Clinician: Referring Provider: Cranford Mon, RICHARD Treating Provider/Extender: Frann Rider in Treatment: 1 Active Problems ICD-10 Encounter Code Description Active Date Diagnosis S31.829A Unspecified open wound of left buttock, initial encounter 09/25/2017 Yes Inactive Problems Resolved Problems Electronic Signature(s) Signed: 10/06/2017 10:23:39 AM By: Christin Fudge MD, FACS Entered By: Christin Fudge on 10/06/2017 10:23:39 Fanguy, Kelly Clark ([id number]) -------------------------------------------------------------------------------- Progress Note Details Patient Name: Kelly Clark Date of Service: 10/06/2017 9:45 AM Medical Record Number: [id number] Patient Account Number: [id number] Date of Birth/Sex: 18-Apr-1929 (83 y.o. Female) Treating RN: Carolyne Fiscal, Debi Primary Care Provider: Cranford Mon, Delfino Lovett Other Clinician: Referring Provider: Cranford Mon, RICHARD Treating Provider/Extender: Frann Rider in Treatment: 1 Subjective Chief Complaint Information obtained from Patient Patient presents to the wound care center with open non-healing surgical wound(s) to the left buttock which she's had  for about 6 weeks History of Present Illness (HPI) The following HPI elements were documented for the patient's wound: Location: left gluteal and upper thigh Quality: Patient reports experiencing a dull pain to affected area(s). Severity: Patient states wound (s) are getting better. Duration: Patient has had the wound for < 6 weeks prior to presenting for treatment Timing: Pain in wound is Intermittent (comes and goes Context: The wound occurred when the patient had an abscess which had to be drained in the urgent care Modifying Factors: Other treatment(s) tried include:clindamycin and Bactrim Associated Signs and Symptoms: Patient reports having:no pain or drainage 83 year old patient was seen by her PCP Dr. Miguel Aschoff for an abscess on her buttock. She was treated with Bactrim orally and Bactroban ointment locally. She was diagnosed with the abscess about 6 weeks  ago and had been treated at the urgent care with IandD and local care at that stage. He now continues to get better but was here for an opinion. 10/06/2017 -- the patient is having some problems with the bordered foam she is applying this area and has had some tape burns causing her some discomfort and distress Objective Constitutional Pulse regular. Respirations normal and unlabored. Afebrile. Vitals Time Taken: 9:42 AM, Height: 62 in, Weight: 178.6 lbs, BMI: 32.7, Temperature: 97.9 F, Pulse: 75 bpm, Respiratory Rate: 18 breaths/min, Blood Pressure: 144/75 mmHg. Eyes Nonicteric. Reactive to light. Ears, Nose, Mouth, and Throat Lips, teeth, and gums WNL.Marland Kitchen Moist mucosa without lesions. Kelly Clark, Kelly Clark. ([id number]) Neck supple and nontender. No palpable supraclavicular or cervical adenopathy. Normal sized without goiter. Respiratory WNL. No retractions.. Breath sounds WNL, No rubs, rales, rhonchi, or wheeze.. Cardiovascular Heart rhythm and rate regular, no murmur or gallop.. Pedal Pulses WNL. No clubbing, cyanosis or  edema. Chest Breasts symmetical and no nipple discharge.. Breast tissue WNL, no masses, lumps, or tenderness.. Lymphatic No adneopathy. No adenopathy. No adenopathy. Musculoskeletal Adexa without tenderness or enlargement.. Digits and nails w/o clubbing, cyanosis, infection, petechiae, ischemia, or inflammatory conditions.Marland Kitchen Psychiatric Judgement and insight Intact.. No evidence of depression, anxiety, or agitation.. General Notes: the wound on the left gluteal area is looking clean today and there was no necrotic debris to be removed. She has some reaction to the tape surrounding the wound and I have requested she stop using the bordered foam and use a small Band-Aid instead Integumentary (Hair, Skin) No suspicious lesions. No crepitus or fluctuance. No peri-wound warmth or erythema. No masses.. Wound #1 status is Open. Original cause of wound was Gradually Appeared. The wound is located on the Left Gluteal fold. The wound measures 0.4cm length x 0.3cm width x 0.1cm depth; 0.094cm^2 area and 0.009cm^3 volume. There is no tunneling or undermining noted. There is a large amount of serous drainage noted. The wound margin is distinct with the outline attached to the wound base. There is large (67-100%) red granulation within the wound bed. There is a small (1-33%) amount of necrotic tissue within the wound bed including Adherent Slough. The periwound skin appearance exhibited: Maceration. Periwound temperature was noted as No Abnormality. The periwound has tenderness on palpation. Assessment Active Problems ICD-10 S31.829A - Unspecified open wound of left buttock, initial encounter Plan Wound Cleansing: Wound #1 Left Gluteal fold: Clean wound with Normal Saline. Kelly Clark, Kelly Clark ([id number]) Cleanse wound with mild soap and water May Shower, gently pat wound dry prior to applying new dressing. Anesthetic: Wound #1 Left Gluteal fold: Topical Lidocaine 4% cream applied to wound bed prior to  debridement Primary Wound Dressing: Wound #1 Left Gluteal fold: Silvercel Non-Adherent Secondary Dressing: Wound #1 Left Gluteal fold: Other - coverlet (band-aide) Follow-up Appointments: Wound #1 Left Gluteal fold: Return Appointment in 1 week. Off-Loading: Wound #1 Left Gluteal fold: Turn and reposition every 2 hours Additional Orders / Instructions: Wound #1 Left Gluteal fold: Increase protein intake. Medications-please add to medication list.: Wound #1 Left Gluteal fold: Other: - Vitamin C, Zinc, Vitamin A the patient has made a good recovery and after review today, I have recommended: 1. Silver alginate and a small Band-Aid to be applied over the wound. 2. visits to the wound center next week, when I anticipate discharge Electronic Signature(s) Signed: 10/06/2017 10:25:52 AM By: Christin Fudge MD, FACS Entered By: Christin Fudge on 10/06/2017 10:25:52 Kelly Clark, Kelly Clark ([id number]) -------------------------------------------------------------------------------- SuperBill Details Patient Name: Kelly Mount  S. Date of Service: 10/06/2017 Medical Record Number: [id number] Patient Account Number: [id number] Date of Birth/Sex: 05-Apr-1929 (83 y.o. Female) Treating RN: Carolyne Fiscal, Debi Primary Care Provider: Cranford Mon, Delfino Lovett Other Clinician: Referring Provider: Cranford Mon, RICHARD Treating Provider/Extender: Frann Rider in Treatment: 1 Diagnosis Coding ICD-10 Codes Code Description [id number] Unspecified open wound of left buttock, initial encounter Physician Procedures CPT4 Code: [id number] Description: 15868 - WC PHYS LEVEL 3 - EST PT ICD-10 Diagnosis Description S31.829A Unspecified open wound of left buttock, initial encounter Modifier: Quantity: 1 Electronic Signature(s) Signed: 10/06/2017 10:27:29 AM By: Christin Fudge MD, FACS Previous Signature: 10/06/2017 10:27:17 AM Version By: Alric Quan Previous Signature: 10/06/2017 10:26:12 AM Version By: Christin Fudge  MD, FACS Entered By: Christin Fudge on 10/06/2017 10:27:29

## 2017-10-10 ENCOUNTER — Ambulatory Visit (INDEPENDENT_AMBULATORY_CARE_PROVIDER_SITE_OTHER): Payer: PPO | Admitting: Family Medicine

## 2017-10-10 ENCOUNTER — Encounter: Payer: PPO | Admitting: Family Medicine

## 2017-10-10 VITALS — BP 160/70 | HR 76 | Temp 97.9°F | Resp 16

## 2017-10-10 DIAGNOSIS — I10 Essential (primary) hypertension: Secondary | ICD-10-CM | POA: Diagnosis not present

## 2017-10-10 DIAGNOSIS — B372 Candidiasis of skin and nail: Secondary | ICD-10-CM | POA: Diagnosis not present

## 2017-10-10 LAB — COMPLETE METABOLIC PANEL WITH GFR
AG Ratio: 1.6 (calc) (ref 1.0–2.5)
ALT: 15 U/L (ref 6–29)
AST: 19 U/L (ref 10–35)
Albumin: 4.1 g/dL (ref 3.6–5.1)
Alkaline phosphatase (APISO): 47 U/L (ref 33–130)
BUN/Creatinine Ratio: 16 (calc) (ref 6–22)
BUN: 15 mg/dL (ref 7–25)
CO2: 31 mmol/L (ref 20–32)
Calcium: 9.5 mg/dL (ref 8.6–10.4)
Chloride: 102 mmol/L (ref 98–110)
Creat: 0.94 mg/dL — ABNORMAL HIGH (ref 0.60–0.88)
GFR, Est African American: 64 mL/min/{1.73_m2} (ref >=60)
GFR, Est Non African American: 55 mL/min/{1.73_m2} — ABNORMAL LOW (ref >=60)
Globulin: 2.5 g/dL (calc) (ref 1.9–3.7)
Glucose, Bld: 89 mg/dL (ref 65–99)
Potassium: 4 mmol/L (ref 3.5–5.3)
Sodium: 139 mmol/L (ref 135–146)
Total Bilirubin: 0.5 mg/dL (ref 0.2–1.2)
Total Protein: 6.6 g/dL (ref 6.1–8.1)

## 2017-10-10 LAB — CBC WITH DIFFERENTIAL/PLATELET
Basophils Absolute: 32 cells/uL (ref 0–200)
Basophils Relative: 0.6 %
Eosinophils Absolute: 80 cells/uL (ref 15–500)
Eosinophils Relative: 1.5 %
HCT: 40.2 % (ref 35.0–45.0)
Hemoglobin: 13.5 g/dL (ref 11.7–15.5)
Lymphs Abs: 1272 cells/uL (ref 850–3900)
MCH: 31.1 pg (ref 27.0–33.0)
MCHC: 33.6 g/dL (ref 32.0–36.0)
MCV: 92.6 fL (ref 80.0–100.0)
MPV: 10.1 fL (ref 7.5–12.5)
Monocytes Relative: 9.3 %
Neutro Abs: 3424 cells/uL (ref 1500–7800)
Neutrophils Relative %: 64.6 %
Platelets: 194 10*3/uL (ref 140–400)
RBC: 4.34 10*6/uL (ref 3.80–5.10)
RDW: 13.9 % (ref 11.0–15.0)
Total Lymphocyte: 24 %
WBC mixed population: 493 cells/uL (ref 200–950)
WBC: 5.3 10*3/uL (ref 3.8–10.8)

## 2017-10-10 LAB — TSH: TSH: 1.6 mIU/L (ref 0.40–4.50)

## 2017-10-10 MED ORDER — NYSTATIN 100000 UNIT/GM EX CREA: 1.0000 "application " | TOPICAL_CREAM | Freq: Two times a day (BID) | CUTANEOUS | 0 refills | Status: DC

## 2017-10-10 MED ORDER — NYSTATIN 100000 UNIT/GM EX CREA
1.0000 | TOPICAL_CREAM | Freq: Two times a day (BID) | CUTANEOUS | 0 refills | Status: DC
Start: 2017-10-10 — End: 2017-10-10

## 2017-10-10 NOTE — Progress Notes (Signed)
Patient: Kelly Clark, Female    DOB: [birth year], 83 y.o.   MRN: [id number] Visit Date: 10/10/2017  Today's Provider: Wilhemena Durie, MD   No chief complaint on file.  Subjective:   Kelly Clark is a 83 y.o. female who presents today for her Subsequent Annual Wellness Visit. She feels well. She reports exercising 3 days weekly. She reports she is sleeping well.  Immunization History  Administered Date(s) Administered  . Influenza, High Dose Seasonal PF 09/23/2015, 10/05/2016  . Pneumococcal Conjugate-13 08/01/2014  . Pneumococcal Polysaccharide-23 06/03/1999  . Td 01/18/2007   05/08/06 colonoscopy external hemorrhoids 12/05/17Mammogram 10/06/10 BMD-normal  Review of Systems  Constitutional: Negative.   HENT: Negative.   Eyes: Negative.   Respiratory: Negative.   Cardiovascular: Negative.   Gastrointestinal: Negative.   Endocrine: Negative.   Genitourinary: Negative.   Musculoskeletal: Negative.   Skin: Positive for rash and wound.  Allergic/Immunologic: Negative.   Neurological: Negative.   Hematological: Negative.   Psychiatric/Behavioral: Negative.     Patient Active Problem List   Diagnosis Date Noted  . Benign essential HTN 09/08/2017  . Adult BMI 30+ 07/06/2015  . Hemorrhoid 07/06/2015  . Neutropenia ([place]) 07/06/2015  . Disorder of peripheral nervous system 07/06/2015  . Avitaminosis D 07/06/2015  . Vaginal atrophy 07/06/2015  . Abnormal LFTs 08/21/2009  . Cannot sleep 05/23/2000  . Hypertriglyceridemia 05/19/2000    Social History   Social History  . Marital status: Widowed    Spouse name: N/A  . Number of children: 1  . Years of education: H/S   Occupational History  . Retired    Social History Main Topics  . Smoking status: Never Smoker  . Smokeless tobacco: Never Used  . Alcohol use No  . Drug use: No  . Sexual activity: Not on file   Other Topics Concern  . Not on file   Social History Narrative  . No narrative on file     Past Surgical History:  Procedure Laterality Date  . ABDOMINAL HYSTERECTOMY  1977  . BREAST BIOPSY Bilateral 1977   benign    Her family history includes Colon cancer in her sister; Congestive Heart Failure in her mother; Healthy in her daughter; Heart attack in her mother; Stroke in her father; Throat cancer in her maternal grandmother.     Outpatient Encounter Prescriptions as of 10/10/2017  Medication Sig Note  . amLODipine (NORVASC) 5 MG tablet Take 1 tablet (5 mg total) by mouth daily.   . Cholecalciferol (VITAMIN D PO) Take 1 capsule by mouth daily.   . clobetasol ointment (TEMOVATE) 0.05 % APPLY TO AFFECTED AREAS ONCE WEEKLY 07/06/2015: Received from: External Pharmacy  . Coenzyme Q10 (CO Q 10) 60 MG CAPS Take 1 capsule by mouth daily.  07/06/2015: DX: 272.1 Received from: [facility]  . gabapentin (NEURONTIN) 600 MG tablet Take 1 tablet (600 mg total) by mouth at bedtime.   . hydrochlorothiazide (HYDRODIURIL) 12.5 MG tablet Take 1 tablet (12.5 mg total) by mouth daily.   Marland Kitchen latanoprost (XALATAN) 0.005 % ophthalmic solution Place 1 drop into both eyes at bedtime.  07/06/2015: Received from: External Pharmacy  . Multiple Vitamin (MULTIVITAMIN) tablet Take 1 tablet by mouth daily.   . multivitamin-lutein (OCUVITE-LUTEIN) CAPS capsule Take 1 capsule by mouth daily.   . mupirocin ointment (BACTROBAN) 2 % apply to affected area twice a day   . polyethylene glycol powder (MIRALAX) powder Take 1 Container by mouth daily.  07/06/2015: DX: 564.00 Received from: [facility]  . sulfamethoxazole-trimethoprim (BACTRIM DS,SEPTRA DS) 800-160 MG tablet Take 1 tablet by mouth 2 (two) times daily. for 10 days    No facility-administered encounter medications on file as of 10/10/2017.     No Known Allergies  Patient Care Team: [name]., MD as PCP - General (Family Medicine) Lorelee Cover., MD as Consulting Physician (Ophthalmology) Oneta Rack, MD as Consulting Physician (Dermatology)   Objective:   Vitals: Wt 181 lbs  BP 160/70  HR 76  RR 16  Physical Exam  Constitutional: She is oriented to person, place, and time. She appears well-developed and well-nourished.  HENT:  Head: Normocephalic and atraumatic.  Right Ear: External ear normal.  Left Ear: External ear normal.  Nose: Nose normal.  Mouth/Throat: Oropharynx is clear and moist.  Upper  Denture and lower partials  Eyes: Pupils are equal, round, and reactive to light. Conjunctivae and EOM are normal.  Neck: Normal range of motion. Neck supple.  Cardiovascular: Normal rate, regular rhythm, normal heart sounds and intact distal pulses.   Pulmonary/Chest: Effort normal and breath sounds normal.  Abdominal: Soft. Bowel sounds are normal.  Musculoskeletal: Normal range of motion. She exhibits edema (trace).  Neurological: She is alert and oriented to person, place, and time. She has normal reflexes.  Skin: Skin is warm and dry.  Psychiatric: She has a normal mood and affect. Her behavior is normal. Judgment and thought content normal.    Activities of Daily Living In your present state of health, do you have any difficulty performing the following activities: 07/06/2017  Hearing? N  Vision? N  Difficulty concentrating or making decisions? N  Walking or climbing stairs? N  Dressing or bathing? N  Doing errands, shopping? N  Preparing Food and eating ? N  Using the Toilet? N  In the past six months, have you accidently leaked urine? Y  Comment occasionally when coughs  Do you have problems with loss of bowel control? N  Managing your Medications? N  Managing your Finances? N  Housekeeping or managing your Housekeeping? N  Some recent data might be hidden    Fall Risk Assessment Fall Risk  07/06/2017 10/05/2016 09/23/2015  Falls in the past year? No Yes No  Number falls in past yr: - 1 -  Injury with Fall? - Yes -     Depression Screen PHQ 2/9 Scores  07/06/2017 10/05/2016 09/23/2015  PHQ - 2 Score 0 0 0       Assessment & Plan:     Annual Wellness Visit  Reviewed patient's Family Medical History Reviewed and updated list of patient's medical providers Assessment of cognitive impairment was done Assessed patient's functional ability Established a written schedule for health screening [place] Completed and Reviewed  Exercise Activities and Dietary recommendations Goals    . Exercise 150 minutes per week (moderate activity)    . Increase water intake          Recommend increasing water intake to 4-6 glasses a day.        Immunization History  Administered Date(s) Administered  . Influenza, High Dose Seasonal PF 09/23/2015, 10/05/2016  . Pneumococcal Conjugate-13 08/01/2014  . Pneumococcal Polysaccharide-23 06/03/1999  . Td 01/18/2007    Health Maintenance  Topic Date Due  . INFLUENZA VACCINE  07/19/2017  . TETANUS/TDAP  12/19/2026 (Originally 01/18/2017)  . DEXA SCAN  Completed  . PNA vac Low Risk Adult  Completed     Discussed health  benefits of physical activity, and encouraged her to engage in regular exercise appropriate for her age and condition.  Ischial Decubitus Per wound care. HTN  I have done the exam and reviewed the chart and it is accurate to the best of my knowledge. Development worker, community has been used and  any errors in dictation or transcription are unintentional. Miguel Aschoff M.D. [facility]

## 2017-10-13 ENCOUNTER — Encounter: Payer: PPO | Admitting: Physician Assistant

## 2017-10-13 DIAGNOSIS — S31829A Unspecified open wound of left buttock, initial encounter: Secondary | ICD-10-CM | POA: Diagnosis not present

## 2017-10-16 NOTE — Progress Notes (Signed)
Kelly Clark ([id number]) Visit Report for 10/13/2017 Arrival Information Details Patient Name: Kelly Clark Date of Service: 10/13/2017 3:15 PM Medical Record Number: [id number] Patient Account Number: [id number] Date of Birth/Sex: 02/18/1929 (83 y.o. Female) Treating RN: Kelly Clark, Kelly Clark Primary Care Kelly Clark: Cranford Mon, Delfino Lovett Other Clinician: Referring Iisha Soyars: Cranford Mon, Delfino Lovett Treating Kelly Clark: Kelly Clark, Kelly Clark: 2 Visit Information History Since Last Visit All ordered tests and consults were completed: No Patient Arrived: Ambulatory Added or deleted any medications: No Arrival Time: 15:22 Any new allergies or adverse reactions: No Accompanied By: self Had a fall or experienced change in No Transfer Assistance: None activities of daily living that may affect Patient Identification Verified: Yes risk of falls: Secondary Verification Process Completed: Yes Signs or symptoms of abuse/neglect since last visito No Patient Requires Transmission-Based No Hospitalized since last visit: No Precautions: Has Dressing in Place as Prescribed: Yes Patient Has Alerts: No Pain Present Now: No Electronic Signature(s) Signed: 10/13/2017 3:57:11 PM By: Alric Quan Entered By: Alric Quan on 10/13/2017 15:23:39 Kelly Clark ([id number]) -------------------------------------------------------------------------------- Encounter Discharge Information Details Patient Name: Kelly Clark Date of Service: 10/13/2017 3:15 PM Medical Record Number: [id number] Patient Account Number: [id number] Date of Birth/Sex: [birth year] (83 y.o. Female) Treating RN: Kelly Clark, Kelly Clark Primary Care Abdiel Blackerby: Cranford Mon, Delfino Lovett Other Clinician: Referring Kelly Clark: Kelly Clark Treating Collie Wernick/Extender: Kelly Clark Weeks in Clark: 2 Encounter Discharge Information Items Discharge Pain Level: 0 Discharge Condition: Stable Ambulatory Status:  Ambulatory Discharge Destination: Home Transportation: Private Auto Accompanied By: self Schedule Follow-up Appointment: Yes Medication Reconciliation completed and No provided to Patient/Care Nyanna Heideman: Provided on Clinical Summary of Care: 10/13/2017 Form Type Recipient Paper Patient HG Electronic Signature(s) Signed: 10/13/2017 3:56:19 PM By: Alric Quan Entered By: Alric Quan on 10/13/2017 15:56:19 Kelly Clark ([id number]) -------------------------------------------------------------------------------- Lower Extremity Assessment Details Patient Name: Kelly Clark Date of Service: 10/13/2017 3:15 PM Medical Record Number: [id number] Patient Account Number: [id number] Date of Birth/Sex: [birth year] (83 y.o. Female) Treating RN: Kelly Clark, Kelly Clark Primary Care Yuto Cajuste: Cranford Mon, Delfino Lovett Other Clinician: Referring Genesys Coggeshall: Cranford Mon, Kelly Clark Treating Athea Haley/Extender: Kelly Clark, Kelly Clark: 2 Electronic Signature(s) Signed: 10/13/2017 3:57:11 PM By: Alric Quan Entered By: Alric Quan on 10/13/2017 15:27:22 [name], Tawny Clark ([id number]) -------------------------------------------------------------------------------- Multi Wound Chart Details Patient Name: Kelly Clark Date of Service: 10/13/2017 3:15 PM Medical Record Number: [id number] Patient Account Number: [id number] Date of Birth/Sex: 09/23/1929 (83 y.o. Female) Treating RN: Kelly Clark, Kelly Clark Primary Care Brylin Stopper: Cranford Mon, Delfino Lovett Other Clinician: Referring Kelly Clark: Cranford Mon, Delfino Lovett Treating Chellsea Beckers/Extender: Kelly Clark, Kelly Clark: 2 Vital Signs Height(in): 62 Pulse(bpm): 77 Weight(lbs): 178.6 Blood Pressure(mmHg): 156/71 Body Mass Index(BMI): 33 Temperature(F): 98.2 Respiratory Rate 18 (breaths/min): Wound Assessments Clark Notes Electronic Signature(s) Signed: 10/13/2017 3:57:11 PM By: Alric Quan Entered By: Alric Quan on  10/13/2017 15:27:51 [name], Tawny Clark ([id number]) -------------------------------------------------------------------------------- [place] Details Patient Name: Kelly Clark Date of Service: 10/13/2017 3:15 PM Medical Record Number: [id number] Patient Account Number: [id number] Date of Birth/Sex: [birth year] (83 y.o. Female) Treating RN: Kelly Clark, Kelly Clark Primary Care Kelly Clark: Cranford Mon, Delfino Lovett Other Clinician: Referring Kelly Clark: Cranford Mon, Delfino Lovett Treating Kelly Clark: Kelly Clark, Kelly Clark: 2 Active Inactive ` Nutrition Nursing Diagnoses: Potential for alteratiion in Nutrition/Potential for imbalanced nutrition Goals: Patient/caregiver agrees to and verbalizes understanding of need to use nutritional supplements and/or vitamins as prescribed Date Initiated: 09/25/2017 Target Resolution Date: 01/27/2018 Goal Status: Active Interventions: Assess patient nutrition upon admission and as needed per policy Notes: `  Orientation to the Wound Care Program Nursing Diagnoses: Knowledge deficit related to the wound healing center program Goals: Patient/caregiver will verbalize understanding of the [facility] Program Date Initiated: 09/25/2017 Target Resolution Date: 10/28/2017 Goal Status: Active Interventions: Provide education on orientation to the wound center Notes: ` Pain, Acute or Chronic Nursing Diagnoses: Pain, acute or chronic: actual or potential Potential alteration in comfort, pain Goals: Patient/caregiver will verbalize adequate pain control between visits Date Initiated: 09/25/2017 Target Resolution Date: 01/27/2018 Goal Status: Active Kelly Clark ([id number]) Interventions: Complete pain assessment as per visit requirements Notes: ` Wound/Skin Impairment Nursing Diagnoses: Impaired tissue integrity Knowledge deficit related to ulceration/compromised skin integrity Goals: Ulcer/skin breakdown will have a  volume reduction of 80% by week 12 Date Initiated: 09/25/2017 Target Resolution Date: 01/20/2018 Goal Status: Active Interventions: Assess patient/caregiver ability to perform ulcer/skin care regimen upon admission and as needed Notes: Electronic Signature(s) Signed: 10/13/2017 3:57:11 PM By: Alric Quan Entered By: Alric Quan on 10/13/2017 15:27:42 Willner, Tawny Clark ([id number]) -------------------------------------------------------------------------------- Pain Assessment Details Patient Name: Kelly Clark Date of Service: 10/13/2017 3:15 PM Medical Record Number: [id number] Patient Account Number: [id number] Date of Birth/Sex: 07-30-1929 (83 y.o. Female) Treating RN: Kelly Clark, Kelly Clark Primary Care Loriel Diehl: Cranford Mon, Delfino Lovett Other Clinician: Referring Corey Caulfield: Kelly Clark Treating Kosisochukwu Burningham/Extender: Kelly Clark, Kelly Clark: 2 Active Problems Location of Pain Severity and Description of Pain Patient Has Paino No Site Locations Pain Management and Medication Current Pain Management: Electronic Signature(s) Signed: 10/13/2017 3:57:11 PM By: Alric Quan Entered By: Alric Quan on 10/13/2017 15:23:44 Linehan, Tawny Clark ([id number]) -------------------------------------------------------------------------------- Patient/Caregiver Education Details Patient Name: Kelly Clark Date of Service: 10/13/2017 3:15 PM Medical Record Number: [id number] Patient Account Number: [id number] Date of Birth/Gender: 19-Jul-1929 (83 y.o. Female) Treating RN: Kelly Clark, Kelly Clark Primary Care Physician: Cranford Mon, Delfino Lovett Other Clinician: Referring Physician: Wilhemena Clark Treating Physician/Extender: Sharalyn Ink in Clark: 2 Education Assessment Education Provided To: Patient Education Topics Provided Wound/Skin Impairment: Handouts: Other: change dressing as ordered Methods: Demonstration, Explain/Verbal Responses: State content  correctly Electronic Signature(s) Signed: 10/13/2017 3:57:11 PM By: Alric Quan Entered By: Alric Quan on 10/13/2017 15:56:35 Flegal, Tawny Clark ([id number]) -------------------------------------------------------------------------------- Wound Assessment Details Patient Name: Kelly Clark Date of Service: 10/13/2017 3:15 PM Medical Record Number: [id number] Patient Account Number: [id number] Date of Birth/Sex: June 13, 1929 (83 y.o. Female) Treating RN: Kelly Clark, Kelly Clark Primary Care Sennie Borden: Cranford Mon, Delfino Lovett Other Clinician: Referring Jalon Squier: Cranford Mon, Kelly Clark Treating Trendon Zaring/Extender: Kelly Clark, Kelly Clark: 2 Wound Status Wound Number: 1 Primary Etiology: Abscess Wound Location: Left Gluteal fold Wound Status: Open Wounding Event: Gradually Appeared Comorbid History: Cataracts, Hypertension, Neuropathy Date Acquired: 08/26/2017 Weeks Of Clark: 2 Clustered Wound: No Photos Photo Uploaded By: Alric Quan on 10/13/2017 16:08:33 Wound Measurements Length: (cm) 0.1 Width: (cm) 0.1 Depth: (cm) 0.1 Area: (cm) 0.008 Volume: (cm) 0.001 % Reduction in Area: 98.7% % Reduction in Volume: 98.4% Epithelialization: Medium (34-66%) Tunneling: No Undermining: No Wound Description Classification: Partial Thickness Wound Margin: Distinct, outline attached Exudate Amount: Large Exudate Type: Serous Exudate Color: amber Foul Odor After Cleansing: No Slough/Fibrino Yes Wound Bed Granulation Amount: Large (67-100%) Granulation Quality: Red Necrotic Amount: Small (1-33%) Necrotic Quality: Adherent Slough Periwound Skin Texture Texture Color No Abnormalities Noted: No No Abnormalities Noted: No Moisture Temperature / Pain Hentz, Crystina S. ([id number]) No Abnormalities Noted: No Temperature: No Abnormality Maceration: Yes Tenderness on Palpation: Yes Wound Preparation Ulcer Cleansing: Rinsed/Irrigated with Saline Topical Anesthetic  Applied: Other: lidocaine 4%, Clark Notes  Wound #1 (Left Gluteal fold) 1. Cleansed with: Clean wound with Normal Saline 2. Anesthetic Topical Lidocaine 4% cream to wound bed prior to debridement 4. Dressing Applied: Other dressing (specify in notes) 5. Secondary [place] Notes [place] Electronic Signature(s) Signed: 10/13/2017 3:57:11 PM By: Alric Quan Entered By: Alric Quan on 10/13/2017 15:34:37 Eiland, Tawny Clark ([id number]) -------------------------------------------------------------------------------- Vitals Details Patient Name: Kelly Clark Date of Service: 10/13/2017 3:15 PM Medical Record Number: [id number] Patient Account Number: [id number] Date of Birth/Sex: November 29, 1929 (83 y.o. Female) Treating RN: Kelly Clark, Kelly Clark Primary Care Shaquira Moroz: Cranford Mon, Delfino Lovett Other Clinician: Referring Freddye Cardamone: Cranford Mon, Kelly Clark Treating Marsha Gundlach/Extender: Kelly Clark, Kelly Clark: 2 Vital Signs Time Taken: 15:23 Temperature (F): 98.2 Height (in): 62 Pulse (bpm): 77 Weight (lbs): 178.6 Respiratory Rate (breaths/min): 18 Body Mass Index (BMI): 32.7 Blood Pressure (mmHg): 156/71 Reference Range: 80 - 120 mg / dl Electronic Signature(s) Signed: 10/13/2017 3:57:11 PM By: Alric Quan Entered By: Alric Quan on 10/13/2017 15:25:28

## 2017-10-18 ENCOUNTER — Other Ambulatory Visit: Payer: Self-pay | Admitting: Family Medicine

## 2017-10-18 DIAGNOSIS — Z1231 Encounter for screening mammogram for malignant neoplasm of breast: Secondary | ICD-10-CM

## 2017-10-20 ENCOUNTER — Encounter: Payer: PPO | Attending: Surgery | Admitting: Surgery

## 2017-10-20 DIAGNOSIS — G629 Polyneuropathy, unspecified: Secondary | ICD-10-CM | POA: Diagnosis not present

## 2017-10-20 DIAGNOSIS — S31829A Unspecified open wound of left buttock, initial encounter: Secondary | ICD-10-CM | POA: Diagnosis not present

## 2017-10-20 DIAGNOSIS — X58XXXA Exposure to other specified factors, initial encounter: Secondary | ICD-10-CM | POA: Insufficient documentation

## 2017-10-20 DIAGNOSIS — I10 Essential (primary) hypertension: Secondary | ICD-10-CM | POA: Diagnosis not present

## 2017-10-20 NOTE — Progress Notes (Signed)
CYDNE, GRAHN ([id number]) Visit Report for 10/13/2017 Chief Complaint Document Details Patient Name: Kelly Clark, Kelly Clark Date of Service: 10/13/2017 3:15 PM Medical Record Number: [id number] Patient Account Number: [id number] Date of Birth/Sex: 04-10-1929 (83 y.o. Female) Treating RN: Carolyne Fiscal, Debi Primary Care Provider: Cranford Mon, Delfino Lovett Other Clinician: Referring Provider: Wilhemena Durie Treating Provider/Extender: Melburn Hake, HOYT Weeks in Treatment: 2 Information Obtained from: Patient Chief Complaint Patient presents to the wound care center with open non-healing surgical wound(s) to the left buttock Electronic Signature(s) Signed: 10/13/2017 5:05:22 PM By: Worthy Keeler PA-C Entered By: Worthy Keeler on 10/13/2017 15:21:10 Kreis, Kelly Clark ([id number]) -------------------------------------------------------------------------------- Debridement Details Patient Name: Kelly Clark Date of Service: 10/13/2017 3:15 PM Medical Record Number: [id number] Patient Account Number: [id number] Date of Birth/Sex: October 16, 1929 (83 y.o. Female) Treating RN: Carolyne Fiscal, Debi Primary Care Provider: Cranford Mon, Delfino Lovett Other Clinician: Referring Provider: Wilhemena Durie Treating Provider/Extender: Melburn Hake, HOYT Weeks in Treatment: 2 Debridement Performed for Wound #1 Left Gluteal fold Assessment: Performed By: Physician STONE III, HOYT E., PA-C Debridement: Debridement Pre-procedure Verification/Time Yes - 15:35 Out Taken: Start Time: 15:36 Pain Control: Lidocaine 4% Topical Solution Level: Skin/Subcutaneous Tissue Total Area Debrided (L x W): 0.1 (cm) x 0.1 (cm) = 0.01 (cm) Tissue and other material Viable, Non-Viable, Exudate, Fibrin/Slough, Subcutaneous debrided: Instrument: Curette Bleeding: Minimum Hemostasis Achieved: Pressure End Time: 15:38 Procedural Pain: 0 Post Procedural Pain: 0 Response to Treatment: Procedure was tolerated well Post Debridement  Measurements of Total Wound Length: (cm) 0.3 Width: (cm) 0.2 Depth: (cm) 0.1 Volume: (cm) 0.005 Character of Wound/Ulcer Post Debridement: Requires Further Debridement Post Procedure Diagnosis Same as Pre-procedure Electronic Signature(s) Signed: 10/13/2017 3:57:11 PM By: Alric Quan Signed: 10/13/2017 5:05:22 PM By: Worthy Keeler PA-C Entered By: Alric Quan on 10/13/2017 15:37:49 Kelly Clark, Kelly Clark ([id number]) -------------------------------------------------------------------------------- HPI Details Patient Name: Kelly Clark Date of Service: 10/13/2017 3:15 PM Medical Record Number: [id number] Patient Account Number: [id number] Date of Birth/Sex: [birth year] (83 y.o. Female) Treating RN: Carolyne Fiscal, Debi Primary Care Provider: Cranford Mon, Delfino Lovett Other Clinician: Referring Provider: Cranford Mon, Delfino Lovett Treating Provider/Extender: Melburn Hake, HOYT Weeks in Treatment: 2 History of Present Illness Location: left gluteal and upper thigh Quality: Patient reports experiencing a dull pain to affected area(s). Severity: Patient states wound (s) are getting better. Duration: Patient has had the wound for < 6 weeks prior to presenting for treatment Timing: Pain in wound is Intermittent (comes and goes Context: The wound occurred when the patient had an abscess which had to be drained in the urgent care Modifying Factors: Other treatment(s) tried include:clindamycin and Bactrim Associated Signs and Symptoms: Patient reports having:no pain or drainage HPI Description: 83 year old patient was seen by her PCP Dr. Miguel Aschoff for an abscess on her buttock. She was treated with Bactrim orally and Bactroban ointment locally. She was diagnosed with the abscess about 6 weeks ago and had been treated at the urgent care with IandD and local care at that stage. He now continues to get better but was here for an opinion. 10/06/2017 -- the patient is having some problems with the  bordered foam she is applying this area and has had some tape burns causing her some discomfort and distress 10/13/17 on evaluation today patient appears to show signs of improvement in regard to her left gluteal region at this point. She has been tolerating the dressing changes okay although she does has an irritation and because she does applied to stressing herself she has some difficulty in  ensuring it's in the correct position. Nonetheless she seems to have done a very good job in caring for it in my opinion. No fevers, chills, nausea, or vomiting noted at this time. Electronic Signature(s) Signed: 10/13/2017 5:05:22 PM By: Worthy Keeler PA-C Entered By: Worthy Keeler on 10/13/2017 16:19:15 Kelly Clark, Kelly Clark ([id number]) -------------------------------------------------------------------------------- Physical Exam Details Patient Name: Kelly Clark Date of Service: 10/13/2017 3:15 PM Medical Record Number: [id number] Patient Account Number: [id number] Date of Birth/Sex: 1929/06/30 (83 y.o. Female) Treating RN: Carolyne Fiscal, Debi Primary Care Provider: Cranford Mon, Delfino Lovett Other Clinician: Referring Provider: Cranford Mon, Delfino Lovett Treating Provider/Extender: Melburn Hake, HOYT Weeks in Treatment: 2 Constitutional patient is hypertensive.. pulse regular and within target range for patient.Marland Kitchen respirations regular, non-labored and within target range for patient.Marland Kitchen temperature within target range for patient.. Well-nourished and well-hydrated in no acute distress. Respiratory normal breathing without difficulty. clear to auscultation bilaterally. Cardiovascular regular rate and rhythm with normal S1, S2. Psychiatric this patient is able to make decisions and demonstrates good insight into disease process. Alert and Oriented x 3. pleasant and cooperative. Notes Patient did have a slight amount of eschar overlying the wound bed on evaluation today.*Whether this was completely healed or not.  However after uncovering the eschar it does appear that she continues to have a small opening at this site. Electronic Signature(s) Signed: 10/13/2017 5:05:22 PM By: Worthy Keeler PA-C Entered By: Worthy Keeler on 10/13/2017 16:20:09 Kelly Clark, Kelly Clark ([id number]) -------------------------------------------------------------------------------- Physician Orders Details Patient Name: Kelly Clark Date of Service: 10/13/2017 3:15 PM Medical Record Number: [id number] Patient Account Number: [id number] Date of Birth/Sex: Nov 13, 1929 (83 y.o. Female) Treating RN: Carolyne Fiscal, Debi Primary Care Provider: Cranford Mon, Delfino Lovett Other Clinician: Referring Provider: Cranford Mon, RICHARD Treating Provider/Extender: Sharalyn Ink in Treatment: 2 Verbal / Phone Orders: Yes ClinicianCarolyne Fiscal, Debi Read Back and Verified: Yes Diagnosis Coding ICD-10 Coding Code Description S31.829A Unspecified open wound of left buttock, initial encounter Wound Cleansing Wound #1 Left Gluteal fold o Clean wound with Normal Saline. o Cleanse wound with mild soap and water o May Shower, gently pat wound dry prior to applying new dressing. Anesthetic Wound #1 Left Gluteal fold o Topical Lidocaine 4% cream applied to wound bed prior to debridement Primary Wound Dressing Wound #1 Left Gluteal fold o Silvercel Non-Adherent Secondary Dressing Wound #1 Left Gluteal fold o Other - [place] Follow-up Appointments Wound #1 Left Gluteal fold o Return Appointment in 1 week. Off-Loading Wound #1 Left Gluteal fold o Turn and reposition every 2 hours Additional Orders / Instructions Wound #1 Left Gluteal fold o Increase protein intake. Medications-please add to medication list. Wound #1 Left Gluteal fold o Other: - Vitamin C, Zinc, Vitamin A Electronic Signature(s) Signed: 10/13/2017 3:57:11 PM By: Wayna Chalet ([id number]) Signed: 10/13/2017 5:05:22 PM By: Worthy Keeler PA-C Entered By: Alric Quan on 10/13/2017 15:55:11 Dudas, Kelly Clark ([id number]) -------------------------------------------------------------------------------- Problem List Details Patient Name: Kelly Clark Date of Service: 10/13/2017 3:15 PM Medical Record Number: [id number] Patient Account Number: [id number] Date of Birth/Sex: 19-Oct-1929 (83 y.o. Female) Treating RN: Carolyne Fiscal, Debi Primary Care Provider: Cranford Mon, Delfino Lovett Other Clinician: Referring Provider: Cranford Mon, Delfino Lovett Treating Provider/Extender: Worthy Keeler Weeks in Treatment: 2 Active Problems ICD-10 Encounter Code Description Active Date Diagnosis S31.829A Unspecified open wound of left buttock, initial encounter 09/25/2017 Yes Inactive Problems Resolved Problems Electronic Signature(s) Signed: 10/13/2017 5:05:22 PM By: Worthy Keeler PA-C Entered By: Worthy Keeler on 10/13/2017 15:20:25  Kelly Clark, Kelly Clark ([id number]) -------------------------------------------------------------------------------- Progress Note Details Patient Name: SAHARA, FUJIMOTO Date of Service: 10/13/2017 3:15 PM Medical Record Number: [id number] Patient Account Number: [id number] Date of Birth/Sex: Nov 03, 1929 (83 y.o. Female) Treating RN: Carolyne Fiscal, Debi Primary Care Provider: Cranford Mon, Delfino Lovett Other Clinician: Referring Provider: Cranford Mon, Delfino Lovett Treating Provider/Extender: Melburn Hake, HOYT Weeks in Treatment: 2 Subjective Chief Complaint Information obtained from Patient Patient presents to the wound care center with open non-healing surgical wound(s) to the left buttock History of Present Illness (HPI) The following HPI elements were documented for the patient's wound: Location: left gluteal and upper thigh Quality: Patient reports experiencing a dull pain to affected area(s). Severity: Patient states wound (s) are getting better. Duration: Patient has had the wound for < 6 weeks prior to presenting for  treatment Timing: Pain in wound is Intermittent (comes and goes Context: The wound occurred when the patient had an abscess which had to be drained in the urgent care Modifying Factors: Other treatment(s) tried include:clindamycin and Bactrim Associated Signs and Symptoms: Patient reports having:no pain or drainage 83 year old patient was seen by her PCP Dr. Miguel Aschoff for an abscess on her buttock. She was treated with Bactrim orally and Bactroban ointment locally. She was diagnosed with the abscess about 6 weeks ago and had been treated at the urgent care with IandD and local care at that stage. He now continues to get better but was here for an opinion. 10/06/2017 -- the patient is having some problems with the bordered foam she is applying this area and has had some tape burns causing her some discomfort and distress 10/13/17 on evaluation today patient appears to show signs of improvement in regard to her left gluteal region at this point. She has been tolerating the dressing changes okay although she does has an irritation and because she does applied to stressing herself she has some difficulty in ensuring it's in the correct position. Nonetheless she seems to have done a very good job in caring for it in my opinion. No fevers, chills, nausea, or vomiting noted at this time. Patient History Information obtained from Patient. Family History Cancer - Siblings, No family history of Diabetes, Heart Disease, Hereditary Spherocytosis, Hypertension, Kidney Disease, Lung Disease, Seizures, Stroke, Thyroid Problems, Tuberculosis. Social History Never smoker, Marital Status - Widowed, Alcohol Use - Never, Drug Use - No History, Caffeine Use - Daily. Review of Systems (ROS) Constitutional Symptoms (General Health) Denies complaints or symptoms of Fever, Chills. Respiratory The patient has no complaints or symptoms. Cardiovascular MAISYN, NOURI. ([id number]) The patient has no  complaints or symptoms. Psychiatric Denies complaints or symptoms of Anxiety. Objective Constitutional patient is hypertensive.. pulse regular and within target range for patient.Marland Kitchen respirations regular, non-labored and within target range for patient.Marland Kitchen temperature within target range for patient.. Well-nourished and well-hydrated in no acute distress. Vitals Time Taken: 3:23 PM, Height: 62 in, Weight: 178.6 lbs, BMI: 32.7, Temperature: 98.2 F, Pulse: 77 bpm, Respiratory Rate: 18 breaths/min, Blood Pressure: 156/71 mmHg. Respiratory normal breathing without difficulty. clear to auscultation bilaterally. Cardiovascular regular rate and rhythm with normal S1, S2. Psychiatric this patient is able to make decisions and demonstrates good insight into disease process. Alert and Oriented x 3. pleasant and cooperative. General Notes: Patient did have a slight amount of eschar overlying the wound bed on evaluation today.*Whether this was completely healed or not. However after uncovering the eschar it does appear that she continues to have a small opening at this site. Integumentary (Hair, Skin)  Wound #1 status is Open. Original cause of wound was Gradually Appeared. The wound is located on the Left Gluteal fold. The wound measures 0.1cm length x 0.1cm width x 0.1cm depth; 0.008cm^2 area and 0.001cm^3 volume. There is no tunneling or undermining noted. There is a large amount of serous drainage noted. The wound margin is distinct with the outline attached to the wound base. There is large (67-100%) red granulation within the wound bed. There is a small (1-33%) amount of necrotic tissue within the wound bed including Adherent Slough. The periwound skin appearance exhibited: Maceration. Periwound temperature was noted as No Abnormality. The periwound has tenderness on palpation. Assessment Active Problems ICD-10 S31.829A - Unspecified open wound of left buttock, initial encounter Kelly Clark, Kelly S.  ([id number]) Procedures Wound #1 Pre-procedure diagnosis of Wound #1 is an Abscess located on the Left Gluteal fold . There was a Skin/Subcutaneous Tissue Debridement ([id number]) debridement with total area of 0.01 sq cm performed by STONE III, HOYT E., PA-C. with the following instrument(s): Curette to remove Viable and Non-Viable tissue/material including Exudate, Fibrin/Slough, and Subcutaneous after achieving pain control using Lidocaine 4% Topical Solution. A time out was conducted at 15:35, prior to the start of the procedure. A Minimum amount of bleeding was controlled with Pressure. The procedure was tolerated well with a pain level of 0 throughout and a pain level of 0 following the procedure. Post Debridement Measurements: 0.3cm length x 0.2cm width x 0.1cm depth; 0.005cm^3 volume. Character of Wound/Ulcer Post Debridement requires further debridement. Post procedure Diagnosis Wound #1: Same as Pre-Procedure Plan Wound Cleansing: Wound #1 Left Gluteal fold: Clean wound with Normal Saline. Cleanse wound with mild soap and water May Shower, gently pat wound dry prior to applying new dressing. Anesthetic: Wound #1 Left Gluteal fold: Topical Lidocaine 4% cream applied to wound bed prior to debridement Primary Wound Dressing: Wound #1 Left Gluteal fold: Silvercel Non-Adherent Secondary Dressing: Wound #1 Left Gluteal fold: Other - [place] Follow-up Appointments: Wound #1 Left Gluteal fold: Return Appointment in 1 week. Off-Loading: Wound #1 Left Gluteal fold: Turn and reposition every 2 hours Additional Orders / Instructions: Wound #1 Left Gluteal fold: Increase protein intake. Medications-please add to medication list.: Wound #1 Left Gluteal fold: Other: - Vitamin C, Zinc, Vitamin A Clark, Kelly S. ([id number]) I am going to recommend that we continue with the Current wound care measures for the next week. We will see her for reevaluation at that point and  hopefully this will be healed in a short amount of time. If anything worsens significantly in the interim patient will contact our office for additional recommendations. Electronic Signature(s) Signed: 10/13/2017 5:05:22 PM By: Worthy Keeler PA-C Entered By: Worthy Keeler on 10/13/2017 16:20:56 Rendleman, Kelly Clark ([id number]) -------------------------------------------------------------------------------- ROS/PFSH Details Patient Name: Kelly Clark Date of Service: 10/13/2017 3:15 PM Medical Record Number: [id number] Patient Account Number: [id number] Date of Birth/Sex: [birth year] (83 y.o. Female) Treating RN: Carolyne Fiscal, Debi Primary Care Provider: Cranford Mon, Delfino Lovett Other Clinician: Referring Provider: Cranford Mon, Delfino Lovett Treating Provider/Extender: Melburn Hake, HOYT Weeks in Treatment: 2 Information Obtained From Patient Wound History Do you currently have one or more open woundso Yes How many open wounds do you currently haveo 1 Approximately how long have you had your woundso 1 month How have you been treating your wound(s) until nowo oral sulfur and bactroban Has your wound(s) ever healed and then re-openedo No Have you had any lab work done in the past montho No Have you tested positive  for an antibiotic resistant organism (MRSA, VRE)o Yes Date: 09/18/2017 Have you tested positive for osteomyelitis (bone infection)o No Have you had any tests for circulation on your legso No Have you had other problems associated with your woundso Infection, Swelling Constitutional Symptoms (General Health) Complaints and Symptoms: Negative for: Fever; Chills Psychiatric Complaints and Symptoms: Negative for: Anxiety Eyes Medical History: Positive for: Cataracts - surgery Respiratory Complaints and Symptoms: No Complaints or Symptoms Cardiovascular Complaints and Symptoms: No Complaints or Symptoms Medical History: Positive for: Hypertension Neurologic Medical History: Positive  for: Neuropathy Oncologic Kelly Clark, Kelly Clark ([id number]) Medical History: Negative for: Received Chemotherapy; Received Radiation HBO Extended History Items Eyes: Cataracts Immunizations Pneumococcal Vaccine: Received Pneumococcal Vaccination: Yes Implantable Devices Family and Social History Cancer: Yes - Siblings; Diabetes: No; Heart Disease: No; Hereditary Spherocytosis: No; Hypertension: No; Kidney Disease: No; Lung Disease: No; Seizures: No; Stroke: No; Thyroid Problems: No; Tuberculosis: No; Never smoker; Marital Status - Widowed; Alcohol Use: Never; Drug Use: No History; Caffeine Use: Daily; Financial Concerns: No; Food, Clothing or Shelter Needs: No; Support System Lacking: No; Transportation Concerns: No; Advanced Directives: No; Patient does not want information on Advanced Directives; Do not resuscitate: No; Living Will: Yes (Not Provided); Medical Power of Attorney: Yes - Estil Daft and Valarie Merino (Not Provided) Physician Affirmation I have reviewed and agree with the above information. Electronic Signature(s) Signed: 10/13/2017 5:05:22 PM By: Worthy Keeler PA-C Signed: 10/18/2017 4:35:14 PM By: Alric Quan Entered By: Worthy Keeler on 10/13/2017 16:19:45 Kelly Clark, Kelly Clark ([id number]) -------------------------------------------------------------------------------- SuperBill Details Patient Name: Kelly Clark Date of Service: 10/13/2017 Medical Record Number: [id number] Patient Account Number: [id number] Date of Birth/Sex: 1929/04/03 (83 y.o. Female) Treating RN: Carolyne Fiscal, Debi Primary Care Provider: Cranford Mon, Delfino Lovett Other Clinician: Referring Provider: Cranford Mon, RICHARD Treating Provider/Extender: Melburn Hake, HOYT Weeks in Treatment: 2 Diagnosis Coding ICD-10 Codes Code Description S31.829A Unspecified open wound of left buttock, initial encounter Facility Procedures CPT4 Code: [id number] Description: 41324 - DEB SUBQ TISSUE 20 SQ CM/< ICD-10  Diagnosis Description S31.829A Unspecified open wound of left buttock, initial encounter Modifier: Quantity: 1 Physician Procedures CPT4 Code: [id number] Description: 53664 - WC PHYS SUBQ TISS 20 SQ CM ICD-10 Diagnosis Description S31.829A Unspecified open wound of left buttock, initial encounter Modifier: Quantity: 1 Electronic Signature(s) Signed: 10/13/2017 5:05:22 PM By: Worthy Keeler PA-C Entered By: Worthy Keeler on 10/13/2017 16:21:06

## 2017-10-22 NOTE — Progress Notes (Signed)
Kelly Clark, Kelly Clark ([id number]) Visit Report for 10/20/2017 Chief Complaint Document Details Patient Name: Kelly Clark Date of Service: 10/20/2017 8:45 AM Medical Record Number: [id number] Patient Account Number: [id number] Date of Birth/Sex: 1929/04/15 (83 y.o. Female) Treating RN: Carolyne Fiscal, Debi Primary Care Provider: Cranford Mon, Delfino Lovett Other Clinician: Referring Provider: Cranford Mon, Delfino Lovett Treating Provider/Extender: Frann Rider in Treatment: 3 Information Obtained from: Patient Chief Complaint Patient presents to the wound care center with open non-healing surgical wound(s) to the left buttock Electronic Signature(s) Signed: 10/20/2017 9:28:28 AM By: Christin Fudge MD, FACS Entered By: Christin Fudge on 10/20/2017 09:28:28 Kelly Clark, Kelly Clark ([id number]) -------------------------------------------------------------------------------- HPI Details Patient Name: Kelly Clark Date of Service: 10/20/2017 8:45 AM Medical Record Number: [id number] Patient Account Number: [id number] Date of Birth/Sex: 02-27-1929 (83 y.o. Female) Treating RN: Carolyne Fiscal, Debi Primary Care Provider: Cranford Mon, Delfino Lovett Other Clinician: Referring Provider: Cranford Mon, RICHARD Treating Provider/Extender: Frann Rider in Treatment: 3 History of Present Illness Location: left gluteal and upper thigh Quality: Patient reports experiencing a dull pain to affected area(s). Severity: Patient states wound (s) are getting better. Duration: Patient has had the wound for < 6 weeks prior to presenting for treatment Timing: Pain in wound is Intermittent (comes and goes Context: The wound occurred when the patient had an abscess which had to be drained in the urgent care Modifying Factors: Other treatment(s) tried include:clindamycin and Bactrim Associated Signs and Symptoms: Patient reports having:no pain or drainage HPI Description: 83 year old patient was seen by her PCP Dr. Miguel Aschoff for an abscess  on her buttock. She was treated with Bactrim orally and Bactroban ointment locally. She was diagnosed with the abscess about 6 weeks ago and had been treated at the urgent care with IandD and local care at that stage. He now continues to get better but was here for an opinion. 10/06/2017 -- the patient is having some problems with the bordered foam she is applying this area and has had some tape burns causing her some discomfort and distress 10/13/17 on evaluation today patient appears to show signs of improvement in regard to her left gluteal region at this point. She has been tolerating the dressing changes okay although she does has an irritation and because she does applied to stressing herself she has some difficulty in ensuring it's in the correct position. Nonetheless she seems to have done a very good job in caring for it in my opinion. No fevers, chills, nausea, or vomiting noted at this time. Electronic Signature(s) Signed: 10/20/2017 9:28:33 AM By: Christin Fudge MD, FACS Entered By: Christin Fudge on 10/20/2017 09:28:33 Kelly Clark, Kelly Clark ([id number]) -------------------------------------------------------------------------------- Physical Exam Details Patient Name: Kelly Clark Date of Service: 10/20/2017 8:45 AM Medical Record Number: [id number] Patient Account Number: [id number] Date of Birth/Sex: 1929/01/09 (83 y.o. Female) Treating RN: Carolyne Fiscal, Debi Primary Care Provider: Wilhemena Durie Other Clinician: Referring Provider: Cranford Mon, RICHARD Treating Provider/Extender: Frann Rider in Treatment: 3 Constitutional . Pulse regular. Respirations normal and unlabored. Afebrile. . Eyes Nonicteric. Reactive to light. Ears, Nose, Mouth, and Throat Lips, teeth, and gums WNL.Marland Kitchen Moist mucosa without lesions. Neck supple and nontender. No palpable supraclavicular or cervical adenopathy. Normal sized without goiter. Respiratory WNL. No retractions.. Cardiovascular Pedal  Pulses WNL. No clubbing, cyanosis or edema. Lymphatic No adneopathy. No adenopathy. No adenopathy. Musculoskeletal Adexa without tenderness or enlargement.. Digits and nails w/o clubbing, cyanosis, infection, petechiae, ischemia, or inflammatory conditions.. Integumentary (Hair, Skin) No suspicious lesions. No crepitus or fluctuance. No peri-wound warmth or  erythema. No masses.Marland Kitchen Psychiatric Judgement and insight Intact.. No evidence of depression, anxiety, or agitation.. Notes wound has healed. Electronic Signature(s) Signed: 10/20/2017 9:28:58 AM By: Christin Fudge MD, FACS Entered By: Christin Fudge on 10/20/2017 09:28:58 Kelly Clark ([id number]) -------------------------------------------------------------------------------- Physician Orders Details Patient Name: Kelly Clark Date of Service: 10/20/2017 8:45 AM Medical Record Number: [id number] Patient Account Number: [id number] Date of Birth/Sex: [birth year] (83 y.o. Female) Treating RN: Carolyne Fiscal, Debi Primary Care Provider: Cranford Mon, Delfino Lovett Other Clinician: Referring Provider: Cranford Mon, RICHARD Treating Provider/Extender: Frann Rider in Treatment: 3 Verbal / Phone Orders: Yes Clinician: Carolyne Fiscal, Debi Read Back and Verified: Yes Diagnosis Coding Discharge From [facility] o Discharge from [place] area clean and dry. Do not put pressure on area. Reposition every 2 hours. If you have any questions or concerns please contact or office. Electronic Signature(s) Signed: 10/20/2017 4:07:41 PM By: Alric Quan Signed: 10/20/2017 4:21:18 PM By: Christin Fudge MD, FACS Previous Signature: 10/20/2017 8:58:02 AM Version By: Alric Quan Entered By: Alric Quan on 10/20/2017 09:11:55 Kelly Clark, Kelly Clark ([id number]) -------------------------------------------------------------------------------- Problem List Details Patient Name: Kelly Clark Date of Service: 10/20/2017 8:45 AM Medical  Record Number: [id number] Patient Account Number: [id number] Date of Birth/Sex: March 30, 1929 (83 y.o. Female) Treating RN: Carolyne Fiscal, Debi Primary Care Provider: Cranford Mon, Delfino Lovett Other Clinician: Referring Provider: Cranford Mon, RICHARD Treating Provider/Extender: Frann Rider in Treatment: 3 Active Problems ICD-10 Encounter Code Description Active Date Diagnosis S31.829A Unspecified open wound of left buttock, initial encounter 09/25/2017 Yes Inactive Problems Resolved Problems Electronic Signature(s) Signed: 10/20/2017 9:28:15 AM By: Christin Fudge MD, FACS Entered By: Christin Fudge on 10/20/2017 09:28:15 Kelly Clark, Kelly Clark ([id number]) -------------------------------------------------------------------------------- Progress Note Details Patient Name: Kelly Clark Date of Service: 10/20/2017 8:45 AM Medical Record Number: [id number] Patient Account Number: [id number] Date of Birth/Sex: 1929-01-26 (83 y.o. Female) Treating RN: Carolyne Fiscal, Debi Primary Care Provider: Cranford Mon, Delfino Lovett Other Clinician: Referring Provider: Cranford Mon, RICHARD Treating Provider/Extender: Frann Rider in Treatment: 3 Subjective Chief Complaint Information obtained from Patient Patient presents to the wound care center with open non-healing surgical wound(s) to the left buttock History of Present Illness (HPI) The following HPI elements were documented for the patient's wound: Location: left gluteal and upper thigh Quality: Patient reports experiencing a dull pain to affected area(s). Severity: Patient states wound (s) are getting better. Duration: Patient has had the wound for < 6 weeks prior to presenting for treatment Timing: Pain in wound is Intermittent (comes and goes Context: The wound occurred when the patient had an abscess which had to be drained in the urgent care Modifying Factors: Other treatment(s) tried include:clindamycin and Bactrim Associated Signs and Symptoms: Patient  reports having:no pain or drainage 83 year old patient was seen by her PCP Dr. Miguel Aschoff for an abscess on her buttock. She was treated with Bactrim orally and Bactroban ointment locally. She was diagnosed with the abscess about 6 weeks ago and had been treated at the urgent care with IandD and local care at that stage. He now continues to get better but was here for an opinion. 10/06/2017 -- the patient is having some problems with the bordered foam she is applying this area and has had some tape burns causing her some discomfort and distress 10/13/17 on evaluation today patient appears to show signs of improvement in regard to her left gluteal region at this point. She has been tolerating the dressing changes okay although she does has an irritation and because she does applied  to stressing herself she has some difficulty in ensuring it's in the correct position. Nonetheless she seems to have done a very good job in caring for it in my opinion. No fevers, chills, nausea, or vomiting noted at this time. Patient History Information obtained from Patient. Family History Cancer - Siblings, No family history of Diabetes, Heart Disease, Hereditary Spherocytosis, Hypertension, Kidney Disease, Lung Disease, Seizures, Stroke, Thyroid Problems, Tuberculosis. Social History Never smoker, Marital Status - Widowed, Alcohol Use - Never, Drug Use - No History, Caffeine Use - Daily. Kelly Clark, Kelly Clark. ([id number]) Objective Constitutional Pulse regular. Respirations normal and unlabored. Afebrile. Vitals Time Taken: 8:48 AM, Height: 62 in, Weight: 178.6 lbs, BMI: 32.7, Temperature: 98.2 F, Pulse: 79 bpm, Respiratory Rate: 18 breaths/min, Blood Pressure: 119/68 mmHg. Eyes Nonicteric. Reactive to light. Ears, Nose, Mouth, and Throat Lips, teeth, and gums WNL.Marland Kitchen Moist mucosa without lesions. Neck supple and nontender. No palpable supraclavicular or cervical adenopathy. Normal sized without  goiter. Respiratory WNL. No retractions.. Cardiovascular Pedal Pulses WNL. No clubbing, cyanosis or edema. Lymphatic No adneopathy. No adenopathy. No adenopathy. Musculoskeletal Adexa without tenderness or enlargement.. Digits and nails w/o clubbing, cyanosis, infection, petechiae, ischemia, or inflammatory conditions.Marland Kitchen Psychiatric Judgement and insight Intact.. No evidence of depression, anxiety, or agitation.. General Notes: wound has healed. Integumentary (Hair, Skin) No suspicious lesions. No crepitus or fluctuance. No peri-wound warmth or erythema. No masses.. Wound #1 status is Healed - Epithelialized. Original cause of wound was Gradually Appeared. The wound is located on the Left Gluteal fold. The wound measures 0cm length x 0cm width x 0cm depth; 0cm^2 area and 0cm^3 volume. There is no tunneling or undermining noted. There is a none present amount of drainage noted. The wound margin is distinct with the outline attached to the wound base. There is no granulation within the wound bed. There is no necrotic tissue within the wound bed. The periwound skin appearance did not exhibit: Maceration. Periwound temperature was noted as No Abnormality. Assessment Active Problems ICD-10 Kelly Clark, Kelly Clark ([id number]) S31.829A - Unspecified open wound of left buttock, initial encounter Plan Discharge From [facility] Services: Discharge from [facility] area clean and dry. Do not put pressure on area. Reposition every 2 hours. If you have any questions or concerns please contact or office. the patient has made and excellent recovery and after review today, I have recommended: 1. discharge from the wound care services 2. Constant offloading of this area so that she does not breakdown her recently healed scar 3. Review back only if the need arises Electronic Signature(s) Signed: 10/20/2017 9:29:58 AM By: Christin Fudge MD, FACS Entered By: Christin Fudge on 10/20/2017 09:29:58 Kelly Clark, Kelly Clark ([id number]) -------------------------------------------------------------------------------- ROS/PFSH Details Patient Name: Kelly Clark Date of Service: 10/20/2017 8:45 AM Medical Record Number: [id number] Patient Account Number: [id number] Date of Birth/Sex: 1929-07-03 (83 y.o. 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, VRE)o Yes Date: 09/18/2017 Have you  tested positive for osteomyelitis (bone infection)o No Have you had any tests for circulation on your legso No Have you had other problems associated with your woundso Infection, Swelling Eyes Medical History: Positive for: Cataracts - surgery Cardiovascular Medical History: Positive for: Hypertension Neurologic Medical History: Positive for: Neuropathy Oncologic Medical History: Negative for: Received Chemotherapy; Received Radiation HBO Extended History Items Eyes: Cataracts Immunizations Pneumococcal Vaccine: Received Pneumococcal Vaccination: Yes Implantable Devices Family and Social History Cancer: Yes - Siblings; Diabetes: No; Heart Disease: No; Hereditary Spherocytosis: No; Hypertension: No; Kidney Disease: No; Lung Disease: No; Seizures: No; Stroke: No; Thyroid Problems: No; Tuberculosis: No; Never smoker; Marital Status Kelly Clark, GARRO. ([id number]) Widowed; Alcohol Use: Never; Drug Use: No  History; Caffeine Use: Daily; Financial Concerns: No; Food, Clothing or Shelter Needs: No; Support System Lacking: No; Transportation Concerns: No; Advanced Directives: No; Patient does not want information on Advanced Directives; Do not resuscitate: No; Living Will: Yes (Not Provided); Medical Power of Attorney: Yes - Estil Daft and Valarie Merino (Not Provided) Physician Affirmation I have reviewed and agree with the above information. Electronic Signature(s) Signed: 10/20/2017 4:07:41 PM By: Alric Quan Signed: 10/20/2017 4:21:18 PM By: Christin Fudge MD, FACS Entered By: Christin Fudge on 10/20/2017 09:28:41 Kelly Clark, Kelly Clark ([id number]) -------------------------------------------------------------------------------- SuperBill Details Patient Name: Kelly Clark Date of Service: 10/20/2017 Medical Record Number: [id number] Patient Account Number: [id number] Date of Birth/Sex: 1928-12-30 (83 y.o. Female) Treating RN: Carolyne Fiscal, Debi Primary Care Provider: Cranford Mon, Delfino Lovett Other Clinician: Referring Provider: Cranford Mon, RICHARD Treating Provider/Extender: Frann Rider in Treatment: 3 Diagnosis Coding ICD-10 Codes Code Description [phone] Unspecified open wound of left buttock, initial encounter Facility Procedures CPT4 Code: [id number] Description: [phone] - WOUND CARE VISIT-LEV 2 EST PT Modifier: Quantity: 1 Physician Procedures CPT4 Code: [id number] Description: [phone] - WC PHYS LEVEL 2 - EST PT ICD-10 Diagnosis Description S31.829A Unspecified open wound of left buttock, initial encounter Modifier: Quantity: 1 Electronic Signature(s) Signed: 10/20/2017 9:49:54 AM By: Alric Quan Signed: 10/20/2017 4:21:18 PM By: Christin Fudge MD, FACS Previous Signature: 10/20/2017 9:30:08 AM Version By: Christin Fudge MD, FACS Entered By: Alric Quan on 10/20/2017 09:49:54

## 2017-10-23 NOTE — Progress Notes (Signed)
Kelly Clark, Kelly Clark ([id number]) Visit Report for 10/20/2017 Arrival Information Details Patient Name: Kelly Clark, Kelly Clark Date of Service: 10/20/2017 8:45 AM Medical Record Number: [id number] Patient Account Number: [id number] Date of Birth/Sex: 08-Oct-1929 (83 y.o. Female) Treating RN: Carolyne Fiscal, Debi Primary Care Moyinoluwa Dawe: Cranford Mon, Delfino Lovett Other Clinician: Referring Nuh Lipton: Cranford Mon, RICHARD Treating Olga Seyler/Extender: Frann Rider in Treatment: 3 Visit Information History Since Last Visit All ordered tests and consults were completed: No Patient Arrived: Ambulatory Added or deleted any medications: No Arrival Time: 08:47 Any new allergies or adverse reactions: No Accompanied By: self Had a fall or experienced change in No Transfer Assistance: None activities of daily living that may affect Patient Identification Verified: Yes risk of falls: Secondary Verification Process Completed: Yes Signs or symptoms of abuse/neglect since last visito No Patient Requires Transmission-Based No Hospitalized since last visit: No Precautions: Has Dressing in Place as Prescribed: Yes Patient Has Alerts: No Pain Present Now: No Electronic Signature(s) Signed: 10/20/2017 4:07:41 PM By: Alric Quan Entered By: Alric Quan on 10/20/2017 08:47:27 Beckett, Kelly Clark ([id number]) -------------------------------------------------------------------------------- Clinic Level of Care Assessment Details Patient Name: Kelly Clark Date of Service: 10/20/2017 8:45 AM Medical Record Number: [id number] Patient Account Number: [id number] Date of Birth/Sex: 1929-07-11 (83 y.o. Female) Treating RN: Carolyne Fiscal, Debi Primary Care Eliyana Pagliaro: Cranford Mon, Delfino Lovett Other Clinician: Referring Edrees Valent: Cranford Mon, RICHARD Treating Algie Westry/Extender: Frann Rider in Treatment: 3 Clinic Level of Care Assessment Items TOOL 4 Quantity Score X - Use when only an EandM is performed on FOLLOW-UP visit 1  0 ASSESSMENTS - Nursing Assessment / Reassessment X - Reassessment of Co-morbidities (includes updates in patient status) 1 10 X- 1 5 Reassessment of Adherence to Treatment Plan ASSESSMENTS - Wound and Skin Assessment / Reassessment X - Simple Wound Assessment / Reassessment - one wound 1 5 []  - 0 Complex Wound Assessment / Reassessment - multiple wounds []  - 0 Dermatologic / Skin Assessment (not related to wound area) ASSESSMENTS - Focused Assessment []  - Circumferential Edema Measurements - multi extremities 0 []  - 0 Nutritional Assessment / Counseling / Intervention []  - 0 Lower Extremity Assessment (monofilament, tuning fork, pulses) []  - 0 Peripheral Arterial Disease Assessment (using hand held doppler) ASSESSMENTS - Ostomy and/or Continence Assessment and Care []  - Incontinence Assessment and Management 0 []  - 0 Ostomy Care Assessment and Management (repouching, etc.) PROCESS - Coordination of Care X - Simple Patient / Family Education for ongoing care 1 15 []  - 0 Complex (extensive) Patient / Family Education for ongoing care []  - 0 Staff obtains Programmer, systems, Records, Test Results / Process Orders []  - 0 Staff telephones HHA, Nursing Homes / Clarify orders / etc []  - 0 Routine Transfer to another Facility (non-emergent condition) []  - 0 Routine Hospital Admission (non-emergent condition) []  - 0 New Admissions / Biomedical engineer / Ordering NPWT, Apligraf, etc. []  - 0 Emergency Hospital Admission (emergent condition) []  - 0 Simple Discharge Coordination Kelly Clark, Kelly S. ([id number]) X- 1 15 Complex (extensive) Discharge Coordination PROCESS - Special Needs []  - Pediatric / Minor Patient Management 0 []  - 0 Isolation Patient Management []  - 0 Hearing / Language / Visual special needs []  - 0 Assessment of Community assistance (transportation, D/C planning, etc.) []  - 0 Additional assistance / Altered mentation []  - 0 Support Surface(s) Assessment (bed,  cushion, seat, etc.) INTERVENTIONS - Wound Cleansing / Measurement X - Simple Wound Cleansing - one wound 1 5 []  - 0 Complex Wound Cleansing - multiple wounds X- 1 5 Wound  Imaging (photographs - any number of wounds) []  - 0 Wound Tracing (instead of photographs) []  - 0 Simple Wound Measurement - one wound []  - 0 Complex Wound Measurement - multiple wounds INTERVENTIONS - Wound Dressings []  - Small Wound Dressing one or multiple wounds 0 []  - 0 Medium Wound Dressing one or multiple wounds []  - 0 Large Wound Dressing one or multiple wounds []  - 0 Application of Medications - topical []  - 0 Application of Medications - injection INTERVENTIONS - Miscellaneous []  - External ear exam 0 []  - 0 Specimen Collection (cultures, biopsies, blood, body fluids, etc.) []  - 0 Specimen(s) / Culture(s) sent or taken to Lab for analysis []  - 0 Patient Transfer (multiple staff / Civil Service fast streamer / Similar devices) []  - 0 Simple Staple / Suture removal (25 or less) []  - 0 Complex Staple / Suture removal (26 or more) []  - 0 Hypo / Hyperglycemic Management (close monitor of Blood Glucose) []  - 0 Ankle / Brachial Index (ABI) - do not check if billed separately X- 1 5 Vital Signs Kelly Clark, Kelly S. ([id number]) Has the patient been seen at the hospital within the last three years: Yes Total Score: 65 Level Of Care: New/Established - Level 2 Electronic Signature(s) Signed: 10/20/2017 4:07:41 PM By: Alric Quan Entered By: Alric Quan on 10/20/2017 09:49:46 Kelly Clark, Kelly Clark ([id number]) -------------------------------------------------------------------------------- Encounter Discharge Information Details Patient Name: Kelly Clark Date of Service: 10/20/2017 8:45 AM Medical Record Number: [id number] Patient Account Number: [id number] Date of Birth/Sex: 31-Mar-1929 (83 y.o. Female) Treating RN: Carolyne Fiscal, Debi Primary Care Hakeem Frazzini: Cranford Mon, Delfino Lovett Other Clinician: Referring Shemuel Harkleroad:  Cranford Mon, RICHARD Treating Kalleigh [place]/Extender: Frann Rider in Treatment: 3 Encounter Discharge Information Items Discharge Pain Level: 0 Discharge Condition: Stable Ambulatory Status: Ambulatory Discharge Destination: Home Transportation: Private Auto Accompanied By: self Schedule Follow-up Appointment: No Medication Reconciliation completed and No provided to Patient/Care Alexandro Line: Provided on Clinical Summary of Care: 10/20/2017 Form Type Recipient Paper Patient HG Electronic Signature(s) Signed: 10/23/2017 10:06:49 AM By: Ruthine Dose Previous Signature: 10/20/2017 8:58:24 AM Version By: Alric Quan Entered By: Ruthine Dose on 10/20/2017 09:16:37 Kelly Clark, Kelly Clark ([id number]) -------------------------------------------------------------------------------- Lower Extremity Assessment Details Patient Name: Kelly Clark Date of Service: 10/20/2017 8:45 AM Medical Record Number: [id number] Patient Account Number: [id number] Date of Birth/Sex: [birth year] (83 y.o. Female) Treating RN: Carolyne Fiscal, Debi Primary Care Ashayla Subia: Cranford Mon, Delfino Lovett Other Clinician: Referring Klaira Pesci: Cranford Mon, RICHARD Treating Siya Flurry/Extender: Frann Rider in Treatment: 3 Electronic Signature(s) Signed: 10/20/2017 8:56:25 AM By: Alric Quan Entered By: Alric Quan on 10/20/2017 08:56:25 Kelly Clark, Kelly Clark ([id number]) -------------------------------------------------------------------------------- Multi Wound Chart Details Patient Name: Kelly Clark Date of Service: 10/20/2017 8:45 AM Medical Record Number: [id number] Patient Account Number: [id number] Date of Birth/Sex: [birth year] (83 y.o. Female) Treating RN: Carolyne Fiscal, Debi Primary Care Afua Hoots: Cranford Mon, Delfino Lovett Other Clinician: Referring Dearra Myhand: Cranford Mon, RICHARD Treating Anadelia Kintz/Extender: Frann Rider in Treatment: 3 Vital Signs Height(in): 62 Pulse(bpm): 10 Weight(lbs): 178.6 Blood  Pressure(mmHg): 119/68 Body Mass Index(BMI): 33 Temperature(F): 98.2 Respiratory Rate 18 (breaths/min): Photos: [1:No Photos] [N/A:N/A] Wound Location: [1:Left Gluteal fold] [N/A:N/A] Wounding Event: [1:Gradually Appeared] [N/A:N/A] Primary Etiology: [1:Abscess] [N/A:N/A] Comorbid History: [1:Cataracts, Hypertension, Neuropathy] [N/A:N/A] Date Acquired: [1:08/26/2017] [N/A:N/A] Weeks of Treatment: [1:3] [N/A:N/A] Wound Status: [1:Healed - Epithelialized] [N/A:N/A] Measurements L x W x D [1:0x0x0] [N/A:N/A] (cm) Area (cm) : [1:0] [N/A:N/A] Volume (cm) : [1:0] [N/A:N/A] % Reduction in Area: [1:100.00%] [N/A:N/A] % Reduction in Volume: [1:100.00%] [N/A:N/A] Classification: [1:Partial Thickness] [N/A:N/A] Exudate Amount: [1:None Present] [N/A:N/A]  Wound Margin: [1:Distinct, outline attached] [N/A:N/A] Granulation Amount: [1:None Present (0%)] [N/A:N/A] Necrotic Amount: [1:None Present (0%)] [N/A:N/A] Epithelialization: [1:Large (67-100%)] [N/A:N/A] Periwound Skin Texture: [1:No Abnormalities Noted] [N/A:N/A] Periwound Skin Moisture: [1:Maceration: No] [N/A:N/A] Periwound Skin Color: [1:No Abnormalities Noted] [N/A:N/A] Temperature: [1:No Abnormality] [N/A:N/A] Tenderness on Palpation: [1:No] [N/A:N/A] Wound Preparation: [1:Ulcer Cleansing: Rinsed/Irrigated with Saline] [N/A:N/A] Topical Anesthetic Applied: None Treatment Notes Electronic Signature(s) Signed: 10/20/2017 9:28:20 AM By: Christin Fudge MD, FACS NEETA, STOREY ([id number]) Previous Signature: 10/20/2017 8:56:55 AM Version By: Alric Quan Entered By: Christin Fudge on 10/20/2017 09:28:20 Kelly Clark, Kelly Clark ([id number]) -------------------------------------------------------------------------------- [place] Details Patient Name: Kelly Clark Date of Service: 10/20/2017 8:45 AM Medical Record Number: [id number] Patient Account Number: [id number] Date of Birth/Sex: [birth year] (83 y.o.  Female) Treating RN: Carolyne Fiscal, Debi Primary Care Kesley Mullens: Cranford Mon, Delfino Lovett Other Clinician: Referring Marland Reine: Cranford Mon, RICHARD Treating Eldonna Neuenfeldt/Extender: Frann Rider in Treatment: 3 Active Inactive Electronic Signature(s) Signed: 10/20/2017 8:56:49 AM By: Alric Quan Previous Signature: 10/20/2017 8:56:37 AM Version By: Alric Quan Entered By: Alric Quan on 10/20/2017 08:56:49 Kelly Clark, Kelly Clark ([id number]) -------------------------------------------------------------------------------- Pain Assessment Details Patient Name: Kelly Clark Date of Service: 10/20/2017 8:45 AM Medical Record Number: [id number] Patient Account Number: [id number] Date of Birth/Sex: July 10, 1929 (83 y.o. Female) Treating RN: Carolyne Fiscal, Debi Primary Care Noell Lorensen: Cranford Mon, Delfino Lovett Other Clinician: Referring Cimberly Stoffel: Cranford Mon, RICHARD Treating Ulric Salzman/Extender: Frann Rider in Treatment: 3 Active Problems Location of Pain Severity and Description of Pain Patient Has Paino No Site Locations Pain Management and Medication Current Pain Management: Electronic Signature(s) Signed: 10/20/2017 4:07:41 PM By: Alric Quan Entered By: Alric Quan on 10/20/2017 08:47:33 Kelly Clark, Kelly Clark ([id number]) -------------------------------------------------------------------------------- Patient/Caregiver Education Details Patient Name: Kelly Clark Date of Service: 10/20/2017 8:45 AM Medical Record Number: [id number] Patient Account Number: [id number] Date of Birth/Gender: March 21, 1929 (83 y.o. Female) Treating RN: Carolyne Fiscal, Debi Primary Care Physician: Wilhemena Durie Other Clinician: Referring Physician: Wilhemena Durie Treating Physician/Extender: Frann Rider in Treatment: 3 Education Assessment Education Provided To: Patient Education Topics Provided Wound/Skin Impairment: Handouts: Other: Keep area clean and dry. If you have any questions or  concerns please contact or office. Electronic Signature(s) Signed: 10/20/2017 4:07:41 PM By: Alric Quan Entered By: Alric Quan on 10/20/2017 08:58:33 Kelly Clark, Kelly Clark ([id number]) -------------------------------------------------------------------------------- Wound Assessment Details Patient Name: Kelly Clark Date of Service: 10/20/2017 8:45 AM Medical Record Number: [id number] Patient Account Number: [id number] Date of Birth/Sex: [birth year] (83 y.o. Female) Treating RN: Carolyne Fiscal, Debi Primary Care Brenlynn Fake: Cranford Mon, Delfino Lovett Other Clinician: Referring Mailyn Steichen: Cranford Mon, RICHARD Treating Mansour Balboa/Extender: Frann Rider in Treatment: 3 Wound Status Wound Number: 1 Primary Etiology: Abscess Wound Location: Left Gluteal fold Wound Status: Healed - Epithelialized Wounding Event: Gradually Appeared Comorbid History: Cataracts, Hypertension, Neuropathy Date Acquired: 08/26/2017 Weeks Of Treatment: 3 Clustered Wound: No Photos Photo Uploaded By: Alric Quan on 10/20/2017 13:36:23 Wound Measurements Length: (cm) 0 % Re Width: (cm) 0 % Re Depth: (cm) 0 Epit Area: (cm) 0 Tun Volume: (cm) 0 Und duction in Area: 100% duction in Volume: 100% helialization: Large (67-100%) neling: No ermining: No Wound Description Classification: Partial Thickness Wound Margin: Distinct, outline attached Exudate Amount: None Present Foul Odor After Cleansing: No Slough/Fibrino No Wound Bed Granulation Amount: None Present (0%) Necrotic Amount: None Present (0%) Periwound Skin Texture Texture Color No Abnormalities Noted: No No Abnormalities Noted: No Moisture Temperature / Pain No Abnormalities Noted: No Temperature: No Abnormality Maceration: No Wound Preparation Ulcer Cleansing: Rinsed/Irrigated with Saline Hornbrook, Kerisha S. (  [id number]) Topical Anesthetic Applied: None Electronic Signature(s) Signed: 10/20/2017 8:56:16 AM By: Alric Quan Entered By:  Alric Quan on 10/20/2017 08:56:16 Topor, Kelly Clark ([id number]) -------------------------------------------------------------------------------- Vitals Details Patient Name: Kelly Clark Date of Service: 10/20/2017 8:45 AM Medical Record Number: [id number] Patient Account Number: [id number] Date of Birth/Sex: 11-18-1929 (83 y.o. Female) Treating RN: Carolyne Fiscal, Debi Primary Care Stasia Somero: Cranford Mon, Delfino Lovett Other Clinician: Referring Jerry Haugen: Cranford Mon, RICHARD Treating Ayvah Caroll/Extender: Frann Rider in Treatment: 3 Vital Signs Time Taken: 08:48 Temperature (F): 98.2 Height (in): 62 Pulse (bpm): 79 Weight (lbs): 178.6 Respiratory Rate (breaths/min): 18 Body Mass Index (BMI): 32.7 Blood Pressure (mmHg): 119/68 Reference Range: 80 - 120 mg / dl Electronic Signature(s) Signed: 10/20/2017 4:07:41 PM By: Alric Quan Entered By: Alric Quan on 10/20/2017 08:50:37

## 2017-11-01 ENCOUNTER — Encounter: Payer: Self-pay | Admitting: Family Medicine

## 2017-11-01 ENCOUNTER — Ambulatory Visit: Payer: PPO | Admitting: Family Medicine

## 2017-11-01 VITALS — BP 136/80 | HR 84 | Temp 98.2°F | Resp 16 | Wt 186.0 lb

## 2017-11-01 DIAGNOSIS — M778 Other enthesopathies, not elsewhere classified: Secondary | ICD-10-CM

## 2017-11-01 DIAGNOSIS — M779 Enthesopathy, unspecified: Principal | ICD-10-CM

## 2017-11-01 DIAGNOSIS — Z23 Encounter for immunization: Secondary | ICD-10-CM | POA: Diagnosis not present

## 2017-11-01 NOTE — Patient Instructions (Signed)
Go to a pharmacy and get a Thumb splint and wear this for about 2 weeks. If this does not get better, let us know and we can refer you to a hand specialist.

## 2017-11-01 NOTE — Progress Notes (Signed)
Patient: Kelly Clark Female    DOB: 1929/05/18   83 y.o.   MRN: [id number] Visit Date: 11/01/2017  Today's Provider: Wilhemena Durie, MD   Chief Complaint  Patient presents with  . Hand Pain   Subjective:    HPI Pt is here today for right hand pain. She reports that she has been having this pain for about 2 weeks. She says it does not hurt unless she is trying to open up something or use it. She reports that it is a throbbing pain. No know injury. She has a direct location of the pain and appears to be a little white spot in her inside of her thumb. She reports that this is really bothering her.         No Known Allergies   Current Outpatient Medications:  .  amLODipine (NORVASC) 5 MG tablet, Take 1 tablet (5 mg total) by mouth daily., Disp: 90 tablet, Rfl: 1 .  Cholecalciferol (VITAMIN D PO), Take 1 capsule by mouth daily., Disp: , Rfl:  .  clobetasol ointment (TEMOVATE) 0.05 %, APPLY TO AFFECTED AREAS ONCE WEEKLY, Disp: , Rfl: 0 .  Coenzyme Q10 (CO Q 10) 60 MG CAPS, Take 1 capsule by mouth daily. , Disp: , Rfl:  .  gabapentin (NEURONTIN) 600 MG tablet, Take 1 tablet (600 mg total) by mouth at bedtime., Disp: 90 tablet, Rfl: 1 .  hydrochlorothiazide (HYDRODIURIL) 12.5 MG tablet, Take 1 tablet (12.5 mg total) by mouth daily., Disp: 90 tablet, Rfl: 1 .  latanoprost (XALATAN) 0.005 % ophthalmic solution, Place 1 drop into both eyes at bedtime. , Disp: , Rfl: 0 .  Multiple Vitamin (MULTIVITAMIN) tablet, Take 1 tablet by mouth daily., Disp: , Rfl:  .  multivitamin-lutein (OCUVITE-LUTEIN) CAPS capsule, Take 1 capsule by mouth daily., Disp: , Rfl:  .  mupirocin ointment (BACTROBAN) 2 %, apply to affected area twice a day, Disp: , Rfl: 0 .  nystatin cream (MYCOSTATIN), Apply 1 application topically 2 (two) times daily., Disp: 30 g, Rfl: 0 .  polyethylene glycol powder (MIRALAX) powder, Take 1 Container by mouth daily. , Disp: , Rfl:  .  PROBIOTIC PRODUCT PO, Take by mouth.,  Disp: , Rfl:   Review of Systems  Constitutional: Negative.   HENT: Negative.   Eyes: Negative.   Respiratory: Negative.   Cardiovascular: Negative.   Gastrointestinal: Negative.   Endocrine: Negative.   Genitourinary: Negative.   Musculoskeletal: Positive for arthralgias.  Skin: Negative.   Allergic/Immunologic: Negative.   Neurological: Negative.   Hematological: Negative.   Psychiatric/Behavioral: Negative.     Social History   Tobacco Use  . Smoking status: Never Smoker  . Smokeless tobacco: Never Used  Substance Use Topics  . Alcohol use: No   Objective:   BP 136/80 (BP Location: Left Arm, Patient Position: Sitting, Cuff Size: Normal)   Pulse 84   Temp 98.2 F (36.8 C) (Oral)   Resp 16   Wt 186 lb (84.4 kg)   BMI 33.48 kg/m  Vitals:   11/01/17 1612  BP: 136/80  Pulse: 84  Resp: 16  Temp: 98.2 F (36.8 C)  TempSrc: Oral  Weight: 186 lb (84.4 kg)     Physical Exam  Constitutional: She is oriented to person, place, and time. She appears well-developed and well-nourished.  HENT:  Head: Normocephalic and atraumatic.  Eyes: Conjunctivae and EOM are normal. Pupils are equal, round, and reactive to light.  Neck: Normal range of  motion. Neck supple.  Cardiovascular: Normal rate, regular rhythm, normal heart sounds and intact distal pulses.  Pulmonary/Chest: Effort normal and breath sounds normal.  Musculoskeletal: She exhibits tenderness.  Neurological: She is alert and oriented to person, place, and time. She has normal reflexes.  Skin: Skin is warm and dry.  Psychiatric: She has a normal mood and affect. Her behavior is normal. Judgment and thought content normal.        Assessment & Plan:     1. Thumb tendonitis Try thumb splint and if no better will referral to [facility]. 2.HTN 3.Ischial Decub Healed per wound care.    HPI, Exam, and A&P Transcribed under the direction and in the presence of Shannen Vernon L. Cranford Mon, MD  Electronically  Signed: Katina Dung, CMA I have done the exam and reviewed the chart and it is accurate to the best of my knowledge. Development worker, community has been used and  any errors in dictation or transcription are unintentional. Miguel Aschoff M.D. [place], MD  [facility]

## 2017-11-15 DIAGNOSIS — M9901 Segmental and somatic dysfunction of cervical region: Secondary | ICD-10-CM | POA: Diagnosis not present

## 2017-11-15 DIAGNOSIS — M25531 Pain in right wrist: Secondary | ICD-10-CM | POA: Diagnosis not present

## 2017-11-15 DIAGNOSIS — M25441 Effusion, right hand: Secondary | ICD-10-CM | POA: Diagnosis not present

## 2017-11-15 DIAGNOSIS — M5412 Radiculopathy, cervical region: Secondary | ICD-10-CM | POA: Diagnosis not present

## 2017-11-16 DIAGNOSIS — M25531 Pain in right wrist: Secondary | ICD-10-CM | POA: Diagnosis not present

## 2017-11-16 DIAGNOSIS — M5412 Radiculopathy, cervical region: Secondary | ICD-10-CM | POA: Diagnosis not present

## 2017-11-16 DIAGNOSIS — M9901 Segmental and somatic dysfunction of cervical region: Secondary | ICD-10-CM | POA: Diagnosis not present

## 2017-11-16 DIAGNOSIS — M25441 Effusion, right hand: Secondary | ICD-10-CM | POA: Diagnosis not present

## 2017-11-17 DIAGNOSIS — M9901 Segmental and somatic dysfunction of cervical region: Secondary | ICD-10-CM | POA: Diagnosis not present

## 2017-11-17 DIAGNOSIS — M25531 Pain in right wrist: Secondary | ICD-10-CM | POA: Diagnosis not present

## 2017-11-17 DIAGNOSIS — M5412 Radiculopathy, cervical region: Secondary | ICD-10-CM | POA: Diagnosis not present

## 2017-11-17 DIAGNOSIS — M25441 Effusion, right hand: Secondary | ICD-10-CM | POA: Diagnosis not present

## 2017-11-21 DIAGNOSIS — M9901 Segmental and somatic dysfunction of cervical region: Secondary | ICD-10-CM | POA: Diagnosis not present

## 2017-11-21 DIAGNOSIS — M5412 Radiculopathy, cervical region: Secondary | ICD-10-CM | POA: Diagnosis not present

## 2017-11-21 DIAGNOSIS — M25441 Effusion, right hand: Secondary | ICD-10-CM | POA: Diagnosis not present

## 2017-11-21 DIAGNOSIS — M25531 Pain in right wrist: Secondary | ICD-10-CM | POA: Diagnosis not present

## 2017-11-22 ENCOUNTER — Encounter: Payer: Self-pay | Admitting: Family Medicine

## 2017-11-22 DIAGNOSIS — M25441 Effusion, right hand: Secondary | ICD-10-CM | POA: Diagnosis not present

## 2017-11-22 DIAGNOSIS — M25531 Pain in right wrist: Secondary | ICD-10-CM | POA: Diagnosis not present

## 2017-11-22 DIAGNOSIS — M5412 Radiculopathy, cervical region: Secondary | ICD-10-CM | POA: Diagnosis not present

## 2017-11-22 DIAGNOSIS — M9901 Segmental and somatic dysfunction of cervical region: Secondary | ICD-10-CM | POA: Diagnosis not present

## 2017-11-24 DIAGNOSIS — M5412 Radiculopathy, cervical region: Secondary | ICD-10-CM | POA: Diagnosis not present

## 2017-11-24 DIAGNOSIS — M9901 Segmental and somatic dysfunction of cervical region: Secondary | ICD-10-CM | POA: Diagnosis not present

## 2017-11-24 DIAGNOSIS — M25441 Effusion, right hand: Secondary | ICD-10-CM | POA: Diagnosis not present

## 2017-11-24 DIAGNOSIS — M25531 Pain in right wrist: Secondary | ICD-10-CM | POA: Diagnosis not present

## 2017-12-01 ENCOUNTER — Ambulatory Visit
Admission: RE | Admit: 2017-12-01 | Discharge: 2017-12-01 | Disposition: A | Discharge status: Home / Self Care | Payer: PPO | Source: Ambulatory Visit | Attending: Family Medicine | Admitting: Family Medicine

## 2017-12-01 DIAGNOSIS — Z1231 Encounter for screening mammogram for malignant neoplasm of breast: Secondary | ICD-10-CM | POA: Insufficient documentation

## 2017-12-01 DIAGNOSIS — M9901 Segmental and somatic dysfunction of cervical region: Secondary | ICD-10-CM | POA: Diagnosis not present

## 2017-12-01 DIAGNOSIS — M5412 Radiculopathy, cervical region: Secondary | ICD-10-CM | POA: Diagnosis not present

## 2017-12-01 DIAGNOSIS — M25441 Effusion, right hand: Secondary | ICD-10-CM | POA: Diagnosis not present

## 2017-12-01 DIAGNOSIS — M25531 Pain in right wrist: Secondary | ICD-10-CM | POA: Diagnosis not present

## 2017-12-04 DIAGNOSIS — M25441 Effusion, right hand: Secondary | ICD-10-CM | POA: Diagnosis not present

## 2017-12-04 DIAGNOSIS — M9901 Segmental and somatic dysfunction of cervical region: Secondary | ICD-10-CM | POA: Diagnosis not present

## 2017-12-04 DIAGNOSIS — M5412 Radiculopathy, cervical region: Secondary | ICD-10-CM | POA: Diagnosis not present

## 2017-12-04 DIAGNOSIS — M25531 Pain in right wrist: Secondary | ICD-10-CM | POA: Diagnosis not present

## 2017-12-05 ENCOUNTER — Other Ambulatory Visit: Payer: Self-pay | Admitting: Family Medicine

## 2017-12-05 DIAGNOSIS — M25531 Pain in right wrist: Secondary | ICD-10-CM | POA: Diagnosis not present

## 2017-12-05 DIAGNOSIS — R928 Other abnormal and inconclusive findings on diagnostic imaging of breast: Secondary | ICD-10-CM

## 2017-12-05 DIAGNOSIS — M9901 Segmental and somatic dysfunction of cervical region: Secondary | ICD-10-CM | POA: Diagnosis not present

## 2017-12-05 DIAGNOSIS — M25441 Effusion, right hand: Secondary | ICD-10-CM | POA: Diagnosis not present

## 2017-12-05 DIAGNOSIS — M5412 Radiculopathy, cervical region: Secondary | ICD-10-CM | POA: Diagnosis not present

## 2017-12-05 DIAGNOSIS — N6310 Unspecified lump in the right breast, unspecified quadrant: Secondary | ICD-10-CM

## 2017-12-06 DIAGNOSIS — M9901 Segmental and somatic dysfunction of cervical region: Secondary | ICD-10-CM | POA: Diagnosis not present

## 2017-12-06 DIAGNOSIS — M25531 Pain in right wrist: Secondary | ICD-10-CM | POA: Diagnosis not present

## 2017-12-06 DIAGNOSIS — M5412 Radiculopathy, cervical region: Secondary | ICD-10-CM | POA: Diagnosis not present

## 2017-12-06 DIAGNOSIS — M25441 Effusion, right hand: Secondary | ICD-10-CM | POA: Diagnosis not present

## 2017-12-13 DIAGNOSIS — M5412 Radiculopathy, cervical region: Secondary | ICD-10-CM | POA: Diagnosis not present

## 2017-12-13 DIAGNOSIS — M9901 Segmental and somatic dysfunction of cervical region: Secondary | ICD-10-CM | POA: Diagnosis not present

## 2017-12-13 DIAGNOSIS — M25531 Pain in right wrist: Secondary | ICD-10-CM | POA: Diagnosis not present

## 2017-12-13 DIAGNOSIS — M25441 Effusion, right hand: Secondary | ICD-10-CM | POA: Diagnosis not present

## 2017-12-15 DIAGNOSIS — M5412 Radiculopathy, cervical region: Secondary | ICD-10-CM | POA: Diagnosis not present

## 2017-12-15 DIAGNOSIS — M9901 Segmental and somatic dysfunction of cervical region: Secondary | ICD-10-CM | POA: Diagnosis not present

## 2017-12-15 DIAGNOSIS — M25441 Effusion, right hand: Secondary | ICD-10-CM | POA: Diagnosis not present

## 2017-12-15 DIAGNOSIS — M25531 Pain in right wrist: Secondary | ICD-10-CM | POA: Diagnosis not present

## 2017-12-18 DIAGNOSIS — M5412 Radiculopathy, cervical region: Secondary | ICD-10-CM | POA: Diagnosis not present

## 2017-12-18 DIAGNOSIS — M25441 Effusion, right hand: Secondary | ICD-10-CM | POA: Diagnosis not present

## 2017-12-18 DIAGNOSIS — M25531 Pain in right wrist: Secondary | ICD-10-CM | POA: Diagnosis not present

## 2017-12-18 DIAGNOSIS — M9901 Segmental and somatic dysfunction of cervical region: Secondary | ICD-10-CM | POA: Diagnosis not present

## 2017-12-19 DIAGNOSIS — C4491 Basal cell carcinoma of skin, unspecified: Secondary | ICD-10-CM

## 2017-12-19 HISTORY — PX: BREAST LUMPECTOMY: SHX2

## 2017-12-19 HISTORY — DX: Basal cell carcinoma of skin, unspecified: C44.91

## 2017-12-20 ENCOUNTER — Ambulatory Visit
Admission: RE | Admit: 2017-12-20 | Discharge: 2017-12-20 | Disposition: A | Discharge status: Home / Self Care | Payer: PPO | Source: Ambulatory Visit | Attending: Family Medicine | Admitting: Family Medicine

## 2017-12-20 DIAGNOSIS — R928 Other abnormal and inconclusive findings on diagnostic imaging of breast: Secondary | ICD-10-CM | POA: Diagnosis not present

## 2017-12-20 DIAGNOSIS — N6310 Unspecified lump in the right breast, unspecified quadrant: Secondary | ICD-10-CM | POA: Diagnosis not present

## 2017-12-20 DIAGNOSIS — N6489 Other specified disorders of breast: Secondary | ICD-10-CM | POA: Diagnosis not present

## 2017-12-27 DIAGNOSIS — M25441 Effusion, right hand: Secondary | ICD-10-CM | POA: Diagnosis not present

## 2017-12-27 DIAGNOSIS — M25531 Pain in right wrist: Secondary | ICD-10-CM | POA: Diagnosis not present

## 2017-12-27 DIAGNOSIS — M9901 Segmental and somatic dysfunction of cervical region: Secondary | ICD-10-CM | POA: Diagnosis not present

## 2017-12-27 DIAGNOSIS — M5412 Radiculopathy, cervical region: Secondary | ICD-10-CM | POA: Diagnosis not present

## 2017-12-29 DIAGNOSIS — M25441 Effusion, right hand: Secondary | ICD-10-CM | POA: Diagnosis not present

## 2017-12-29 DIAGNOSIS — M9901 Segmental and somatic dysfunction of cervical region: Secondary | ICD-10-CM | POA: Diagnosis not present

## 2017-12-29 DIAGNOSIS — M25531 Pain in right wrist: Secondary | ICD-10-CM | POA: Diagnosis not present

## 2017-12-29 DIAGNOSIS — M5412 Radiculopathy, cervical region: Secondary | ICD-10-CM | POA: Diagnosis not present

## 2018-01-03 DIAGNOSIS — M5412 Radiculopathy, cervical region: Secondary | ICD-10-CM | POA: Diagnosis not present

## 2018-01-03 DIAGNOSIS — M9901 Segmental and somatic dysfunction of cervical region: Secondary | ICD-10-CM | POA: Diagnosis not present

## 2018-01-03 DIAGNOSIS — M25531 Pain in right wrist: Secondary | ICD-10-CM | POA: Diagnosis not present

## 2018-01-03 DIAGNOSIS — M25441 Effusion, right hand: Secondary | ICD-10-CM | POA: Diagnosis not present

## 2018-01-04 DIAGNOSIS — L900 Lichen sclerosus et atrophicus: Secondary | ICD-10-CM | POA: Diagnosis not present

## 2018-01-05 ENCOUNTER — Other Ambulatory Visit: Payer: Self-pay | Admitting: Physician Assistant

## 2018-01-05 ENCOUNTER — Other Ambulatory Visit: Payer: Self-pay | Admitting: Family Medicine

## 2018-01-05 DIAGNOSIS — M9901 Segmental and somatic dysfunction of cervical region: Secondary | ICD-10-CM | POA: Diagnosis not present

## 2018-01-05 DIAGNOSIS — M5412 Radiculopathy, cervical region: Secondary | ICD-10-CM | POA: Diagnosis not present

## 2018-01-05 DIAGNOSIS — M25441 Effusion, right hand: Secondary | ICD-10-CM | POA: Diagnosis not present

## 2018-01-05 DIAGNOSIS — M25531 Pain in right wrist: Secondary | ICD-10-CM | POA: Diagnosis not present

## 2018-01-08 DIAGNOSIS — M25441 Effusion, right hand: Secondary | ICD-10-CM | POA: Diagnosis not present

## 2018-01-08 DIAGNOSIS — M25531 Pain in right wrist: Secondary | ICD-10-CM | POA: Diagnosis not present

## 2018-01-08 DIAGNOSIS — M9901 Segmental and somatic dysfunction of cervical region: Secondary | ICD-10-CM | POA: Diagnosis not present

## 2018-01-08 DIAGNOSIS — M5412 Radiculopathy, cervical region: Secondary | ICD-10-CM | POA: Diagnosis not present

## 2018-01-15 DIAGNOSIS — M25441 Effusion, right hand: Secondary | ICD-10-CM | POA: Diagnosis not present

## 2018-01-15 DIAGNOSIS — M5412 Radiculopathy, cervical region: Secondary | ICD-10-CM | POA: Diagnosis not present

## 2018-01-15 DIAGNOSIS — M9901 Segmental and somatic dysfunction of cervical region: Secondary | ICD-10-CM | POA: Diagnosis not present

## 2018-01-15 DIAGNOSIS — M25531 Pain in right wrist: Secondary | ICD-10-CM | POA: Diagnosis not present

## 2018-01-22 DIAGNOSIS — M9901 Segmental and somatic dysfunction of cervical region: Secondary | ICD-10-CM | POA: Diagnosis not present

## 2018-01-22 DIAGNOSIS — M25531 Pain in right wrist: Secondary | ICD-10-CM | POA: Diagnosis not present

## 2018-01-22 DIAGNOSIS — M25441 Effusion, right hand: Secondary | ICD-10-CM | POA: Diagnosis not present

## 2018-01-22 DIAGNOSIS — M5412 Radiculopathy, cervical region: Secondary | ICD-10-CM | POA: Diagnosis not present

## 2018-01-29 DIAGNOSIS — M25531 Pain in right wrist: Secondary | ICD-10-CM | POA: Diagnosis not present

## 2018-01-29 DIAGNOSIS — M5412 Radiculopathy, cervical region: Secondary | ICD-10-CM | POA: Diagnosis not present

## 2018-01-29 DIAGNOSIS — M25441 Effusion, right hand: Secondary | ICD-10-CM | POA: Diagnosis not present

## 2018-01-29 DIAGNOSIS — M9901 Segmental and somatic dysfunction of cervical region: Secondary | ICD-10-CM | POA: Diagnosis not present

## 2018-02-01 DIAGNOSIS — H40153 Residual stage of open-angle glaucoma, bilateral: Secondary | ICD-10-CM | POA: Diagnosis not present

## 2018-02-05 DIAGNOSIS — M25531 Pain in right wrist: Secondary | ICD-10-CM | POA: Diagnosis not present

## 2018-02-05 DIAGNOSIS — M5412 Radiculopathy, cervical region: Secondary | ICD-10-CM | POA: Diagnosis not present

## 2018-02-05 DIAGNOSIS — M9901 Segmental and somatic dysfunction of cervical region: Secondary | ICD-10-CM | POA: Diagnosis not present

## 2018-02-05 DIAGNOSIS — M25441 Effusion, right hand: Secondary | ICD-10-CM | POA: Diagnosis not present

## 2018-03-22 NOTE — Telephone Encounter (Signed)
complete

## 2018-04-10 ENCOUNTER — Ambulatory Visit (INDEPENDENT_AMBULATORY_CARE_PROVIDER_SITE_OTHER): Payer: PPO | Admitting: Family Medicine

## 2018-04-10 ENCOUNTER — Encounter: Payer: Self-pay | Admitting: Family Medicine

## 2018-04-10 VITALS — BP 142/80 | HR 68 | Temp 98.5°F | Resp 16 | Wt 180.0 lb

## 2018-04-10 DIAGNOSIS — E781 Pure hyperglyceridemia: Secondary | ICD-10-CM

## 2018-04-10 DIAGNOSIS — I10 Essential (primary) hypertension: Secondary | ICD-10-CM

## 2018-04-10 NOTE — Progress Notes (Signed)
Patient: Kelly Clark Female    DOB: [birth year]   84 y.o.   MRN: [id number] Visit Date: 04/10/2018  Today's Provider: Wilhemena Durie, MD   Chief Complaint  Patient presents with  . Hypertension   Subjective:    HPI  Hypertension, follow-up:  BP Readings from Last 3 Encounters:  04/10/18 (!) 142/80  11/01/17 136/80  10/10/17 (!) 160/70    She was last seen for hypertension 6 months ago.  BP at that visit was 136/80. Management since that visit includes none. She reports good compliance with treatment. She is not having side effects.  She is exercising. She is adherent to low salt diet.   Outside blood pressures are 140's/80's. Patient denies chest pain, chest pressure/discomfort, claudication, dyspnea, exertional chest pressure/discomfort, fatigue, irregular heart beat, lower extremity edema, near-syncope, orthopnea, palpitations, paroxysmal nocturnal dyspnea, syncope and tachypnea.    Wt Readings from Last 3 Encounters:  04/10/18 180 lb (81.6 kg)  11/01/17 186 lb (84.4 kg)  09/21/17 181 lb (82.1 kg)   ------------------------------------------------------------------------     No Known Allergies   Current Outpatient Medications:  .  amLODipine (NORVASC) 5 MG tablet, take 1 tablet by mouth once daily, Disp: 90 tablet, Rfl: 1 .  Cholecalciferol (VITAMIN D PO), Take 1 capsule by mouth daily., Disp: , Rfl:  .  clobetasol ointment (TEMOVATE) 0.05 %, APPLY TO AFFECTED AREAS ONCE WEEKLY, Disp: , Rfl: 0 .  Coenzyme Q10 (CO Q 10) 60 MG CAPS, Take 1 capsule by mouth daily. , Disp: , Rfl:  .  gabapentin (NEURONTIN) 600 MG tablet, take 1 tablet by mouth at bedtime, Disp: 90 tablet, Rfl: 1 .  hydrochlorothiazide (HYDRODIURIL) 12.5 MG tablet, take 1 tablet by mouth once daily, Disp: 90 tablet, Rfl: 1 .  latanoprost (XALATAN) 0.005 % ophthalmic solution, Place 1 drop into both eyes at bedtime. , Disp: , Rfl: 0 .  Multiple Vitamin (MULTIVITAMIN) tablet, Take 1 tablet  by mouth daily., Disp: , Rfl:  .  multivitamin-lutein (OCUVITE-LUTEIN) CAPS capsule, Take 1 capsule by mouth daily., Disp: , Rfl:  .  mupirocin ointment (BACTROBAN) 2 %, apply to affected area twice a day, Disp: , Rfl: 0 .  nystatin cream (MYCOSTATIN), Apply 1 application topically 2 (two) times daily., Disp: 30 g, Rfl: 0 .  polyethylene glycol powder (MIRALAX) powder, Take 1 Container by mouth daily. , Disp: , Rfl:  .  PROBIOTIC PRODUCT PO, Take by mouth., Disp: , Rfl:   Review of Systems  Constitutional: Negative.   HENT: Negative.   Eyes: Negative.   Respiratory: Negative.   Cardiovascular: Negative.   Gastrointestinal: Negative.   Endocrine: Negative.   Genitourinary: Negative.   Musculoskeletal: Negative.   Skin: Negative.   Allergic/Immunologic: Negative.   Neurological: Negative.   Hematological: Negative.   Psychiatric/Behavioral: Negative.     Social History   Tobacco Use  . Smoking status: Never Smoker  . Smokeless tobacco: Never Used  Substance Use Topics  . Alcohol use: No   Objective:   BP (!) 142/80 (BP Location: Left Arm, Patient Position: Sitting, Cuff Size: Normal)   Pulse 68   Temp 98.5 F (36.9 C) (Oral)   Resp 16   Wt 180 lb (81.6 kg)   BMI 32.40 kg/m  Vitals:   04/10/18 1054  BP: (!) 142/80  Pulse: 68  Resp: 16  Temp: 98.5 F (36.9 C)  TempSrc: Oral  Weight: 180 lb (81.6 kg)     Physical  Exam  Constitutional: She is oriented to person, place, and time. She appears well-developed and well-nourished.  HENT:  Head: Normocephalic and atraumatic.  Eyes: No scleral icterus.  Neck: No thyromegaly present.  Cardiovascular: Normal rate, regular rhythm and normal heart sounds.  Pulmonary/Chest: Effort normal and breath sounds normal.  Abdominal: Soft.  Lymphadenopathy:    She has no cervical adenopathy.  Neurological: She is alert and oriented to person, place, and time.  Skin: Skin is warm and dry.  Psychiatric: She has a normal mood and  affect. Her behavior is normal. Judgment and thought content normal.        Assessment & Plan:     HTN HLD Stable. RTC 6 months       I have done the exam and reviewed the chart and it is accurate to the best of my knowledge. Development worker, community has been used and  any errors in dictation or transcription are unintentional. Miguel Aschoff M.D. [place], MD  [facility]

## 2018-05-10 ENCOUNTER — Telehealth: Payer: Self-pay | Admitting: Emergency Medicine

## 2018-05-10 DIAGNOSIS — R928 Other abnormal and inconclusive findings on diagnostic imaging of breast: Secondary | ICD-10-CM

## 2018-05-10 DIAGNOSIS — N6310 Unspecified lump in the right breast, unspecified quadrant: Secondary | ICD-10-CM

## 2018-05-10 NOTE — Telephone Encounter (Signed)
Pt called stating that it was time for her follow up diagnostic mammogram. [facility] needs orders. Please advise. Thanks.

## 2018-05-10 NOTE — Telephone Encounter (Signed)
ok 

## 2018-05-10 NOTE — Telephone Encounter (Signed)
Tried calling [facility] and was on hold for 66mins. Will try again later to confirm which orders they need.

## 2018-05-15 NOTE — Telephone Encounter (Signed)
Spoke with Melissa and received the right orders for diagnostic Mammogram. Orders have been placed.

## 2018-05-21 DIAGNOSIS — H40153 Residual stage of open-angle glaucoma, bilateral: Secondary | ICD-10-CM | POA: Diagnosis not present

## 2018-06-22 ENCOUNTER — Ambulatory Visit
Admission: RE | Admit: 2018-06-22 | Discharge: 2018-06-22 | Disposition: A | Discharge status: Home / Self Care | Payer: PPO | Source: Ambulatory Visit | Attending: Family Medicine | Admitting: Family Medicine

## 2018-06-22 DIAGNOSIS — N6310 Unspecified lump in the right breast, unspecified quadrant: Secondary | ICD-10-CM | POA: Insufficient documentation

## 2018-06-22 DIAGNOSIS — N6312 Unspecified lump in the right breast, upper inner quadrant: Secondary | ICD-10-CM | POA: Diagnosis not present

## 2018-06-22 DIAGNOSIS — R928 Other abnormal and inconclusive findings on diagnostic imaging of breast: Secondary | ICD-10-CM | POA: Insufficient documentation

## 2018-06-25 ENCOUNTER — Other Ambulatory Visit: Payer: Self-pay | Admitting: Family Medicine

## 2018-06-25 DIAGNOSIS — R928 Other abnormal and inconclusive findings on diagnostic imaging of breast: Secondary | ICD-10-CM

## 2018-06-25 DIAGNOSIS — N6310 Unspecified lump in the right breast, unspecified quadrant: Secondary | ICD-10-CM

## 2018-06-27 ENCOUNTER — Ambulatory Visit
Admission: RE | Admit: 2018-06-27 | Discharge: 2018-06-27 | Disposition: A | Discharge status: Home / Self Care | Payer: PPO | Source: Ambulatory Visit | Attending: Family Medicine | Admitting: Family Medicine

## 2018-06-27 DIAGNOSIS — C50211 Malignant neoplasm of upper-inner quadrant of right female breast: Secondary | ICD-10-CM | POA: Diagnosis not present

## 2018-06-27 DIAGNOSIS — R928 Other abnormal and inconclusive findings on diagnostic imaging of breast: Secondary | ICD-10-CM | POA: Insufficient documentation

## 2018-06-27 DIAGNOSIS — C50919 Malignant neoplasm of unspecified site of unspecified female breast: Secondary | ICD-10-CM

## 2018-06-27 DIAGNOSIS — N6310 Unspecified lump in the right breast, unspecified quadrant: Secondary | ICD-10-CM | POA: Insufficient documentation

## 2018-06-27 DIAGNOSIS — N6312 Unspecified lump in the right breast, upper inner quadrant: Secondary | ICD-10-CM | POA: Diagnosis not present

## 2018-06-27 HISTORY — DX: Malignant neoplasm of unspecified site of unspecified female breast: C50.919

## 2018-06-27 HISTORY — PX: BREAST BIOPSY: SHX20

## 2018-06-28 ENCOUNTER — Other Ambulatory Visit: Payer: Self-pay | Admitting: Pathology

## 2018-07-02 ENCOUNTER — Other Ambulatory Visit: Payer: Self-pay | Admitting: Family Medicine

## 2018-07-02 LAB — SURGICAL PATHOLOGY

## 2018-07-03 ENCOUNTER — Telehealth: Payer: Self-pay | Admitting: Family Medicine

## 2018-07-03 DIAGNOSIS — C50919 Malignant neoplasm of unspecified site of unspecified female breast: Secondary | ICD-10-CM

## 2018-07-03 NOTE — Telephone Encounter (Signed)
I recently spoke to Dr. Rosanna Randy about this issus

## 2018-07-03 NOTE — Telephone Encounter (Signed)
Pt stated that she had a breast biopsy done at [facility] and that Selinda Flavin (pt stated she is the radiologist) advised her that she has a small cancer and that Dr. Rosanna Randy needs to refer her to an oncologist. Pt is requesting oncology referral. Please advise. Thanks TNP

## 2018-07-04 DIAGNOSIS — Z85828 Personal history of other malignant neoplasm of skin: Secondary | ICD-10-CM | POA: Diagnosis not present

## 2018-07-04 DIAGNOSIS — Z08 Encounter for follow-up examination after completed treatment for malignant neoplasm: Secondary | ICD-10-CM | POA: Diagnosis not present

## 2018-07-04 DIAGNOSIS — D485 Neoplasm of uncertain behavior of skin: Secondary | ICD-10-CM | POA: Diagnosis not present

## 2018-07-04 DIAGNOSIS — Z85820 Personal history of malignant melanoma of skin: Secondary | ICD-10-CM | POA: Diagnosis not present

## 2018-07-04 DIAGNOSIS — L900 Lichen sclerosus et atrophicus: Secondary | ICD-10-CM | POA: Diagnosis not present

## 2018-07-04 DIAGNOSIS — C44622 Squamous cell carcinoma of skin of right upper limb, including shoulder: Secondary | ICD-10-CM | POA: Diagnosis not present

## 2018-07-04 NOTE — Telephone Encounter (Signed)
Order has been placed.

## 2018-07-09 ENCOUNTER — Encounter: Payer: Self-pay | Admitting: Oncology

## 2018-07-09 ENCOUNTER — Other Ambulatory Visit: Payer: Self-pay

## 2018-07-09 ENCOUNTER — Inpatient Hospital Stay: Payer: PPO

## 2018-07-09 ENCOUNTER — Inpatient Hospital Stay: Payer: PPO | Attending: Oncology | Admitting: Oncology

## 2018-07-09 ENCOUNTER — Encounter: Payer: Self-pay | Admitting: *Deleted

## 2018-07-09 ENCOUNTER — Other Ambulatory Visit: Payer: Self-pay | Admitting: *Deleted

## 2018-07-09 VITALS — BP 149/74 | HR 70 | Temp 96.8°F | Resp 18 | Ht 62.0 in | Wt 182.9 lb

## 2018-07-09 DIAGNOSIS — C50211 Malignant neoplasm of upper-inner quadrant of right female breast: Secondary | ICD-10-CM | POA: Diagnosis not present

## 2018-07-09 DIAGNOSIS — C50919 Malignant neoplasm of unspecified site of unspecified female breast: Secondary | ICD-10-CM

## 2018-07-09 DIAGNOSIS — G629 Polyneuropathy, unspecified: Secondary | ICD-10-CM | POA: Diagnosis not present

## 2018-07-09 DIAGNOSIS — I10 Essential (primary) hypertension: Secondary | ICD-10-CM | POA: Insufficient documentation

## 2018-07-09 LAB — COMPREHENSIVE METABOLIC PANEL
ALT: 21 U/L (ref 0–44)
AST: 26 U/L (ref 15–41)
Albumin: 4.3 g/dL (ref 3.5–5.0)
Alkaline Phosphatase: 41 U/L (ref 38–126)
Anion gap: 8 (ref 5–15)
BUN: 15 mg/dL (ref 8–23)
CO2: 27 mmol/L (ref 22–32)
Calcium: 9.5 mg/dL (ref 8.9–10.3)
Chloride: 106 mmol/L (ref 98–111)
Creatinine, Ser: 0.94 mg/dL (ref 0.44–1.00)
GFR calc Af Amer: >60 mL/min (ref >60)
GFR calc non Af Amer: 53 mL/min — ABNORMAL LOW (ref >60)
Glucose, Bld: 105 mg/dL — ABNORMAL HIGH (ref 70–99)
Potassium: 4.2 mmol/L (ref 3.5–5.1)
Sodium: 141 mmol/L (ref 135–145)
Total Bilirubin: 0.8 mg/dL (ref 0.3–1.2)
Total Protein: 7.2 g/dL (ref 6.5–8.1)

## 2018-07-09 LAB — CBC WITH DIFFERENTIAL/PLATELET
Basophils Absolute: 0 10*3/uL (ref 0–0.1)
Basophils Relative: 1 %
Eosinophils Absolute: 0.1 10*3/uL (ref 0–0.7)
Eosinophils Relative: 2 %
HCT: 41.7 % (ref 35.0–47.0)
Hemoglobin: 14.1 g/dL (ref 12.0–16.0)
Lymphocytes Relative: 20 %
Lymphs Abs: 1.1 10*3/uL (ref 1.0–3.6)
MCH: 31.6 pg (ref 26.0–34.0)
MCHC: 33.7 g/dL (ref 32.0–36.0)
MCV: 93.7 fL (ref 80.0–100.0)
Monocytes Absolute: 0.4 10*3/uL (ref 0.2–0.9)
Monocytes Relative: 7 %
Neutro Abs: 3.9 10*3/uL (ref 1.4–6.5)
Neutrophils Relative %: 70 %
Platelets: 180 10*3/uL (ref 150–440)
RBC: 4.45 MIL/uL (ref 3.80–5.20)
RDW: 15.3 % — ABNORMAL HIGH (ref 11.5–14.5)
WBC: 5.5 10*3/uL (ref 3.6–11.0)

## 2018-07-09 NOTE — Progress Notes (Addendum)
Hematology/Oncology Consult note [facility] [phone] [phone]   Patient Care Team: [name]., MD as PCP - General (Family Medicine) Lorelee Cover., MD as Consulting Physician (Ophthalmology) Oneta Rack, MD as Consulting Physician (Dermatology)  REFERRING PROVIDER: Dr.Gilbert CHIEF COMPLAINTS/REASON FOR VISIT:  Evaluation of breast cancer.  HISTORY OF PRESENTING ILLNESS:  Kelly Clark is a  84 y.o.  female with PMH listed below who was referred to me for evaluation of breast cancer.  Patient had mammogram in 12/01/2017 which showed possible right breast mass.  She had a diagnostic mammogram on 12/20/2017 which showed right breast slightly lower inner quadrant 4 mm mammographically appearing nodule without sonographic correlation.  Recommend patient to have a diagnostic mammogram and ultrasound of the right breast in 6 months. Repeat mammogram diagnostic 06/22/2018 showed 6 mm mass in the upper inner quadrant of the right breast with spiculated borders.  Ultrasound confirmed with irregular hypoechoic mass in the right breast at 2:00 9 cm from nipple measuring 6 x 5 x 5 mm.  Sonographic evaluation of right axilla does not show any enlarged adenopathy.  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  Nipple discharge: Denies Family history, sister had colon cancer, maternal grandmother had throat cancer. Estrogen and progesterone therapy: Admit to estrogen replacement therapy from 19 77-2009. History of radiation to chest: denies.  Previous breast surgery: Previous lumpectomy in 1970s with benign breast pathology.  Patient is very active, lives by herself, independent.   Review of Systems  Constitutional: Negative for chills, fever, malaise/fatigue and weight loss.  HENT: Negative for nosebleeds and sore throat.   Eyes: Negative for double  vision, photophobia and redness.  Respiratory: Negative for cough, shortness of breath and wheezing.   Cardiovascular: Negative for chest pain, palpitations and orthopnea.  Gastrointestinal: Negative for abdominal pain, blood in stool, nausea and vomiting.  Genitourinary: Negative for dysuria.  Musculoskeletal: Negative for back pain, myalgias and neck pain.  Skin: Negative for itching and rash.  Neurological: Negative for dizziness, tingling and tremors.  Endo/Heme/Allergies: Negative for environmental allergies. Does not bruise/bleed easily.  Psychiatric/Behavioral: Negative for depression.      MEDICAL HISTORY:  Past Medical History:  Diagnosis Date  . Cancer ([place])    melanoma  . Hypertension     SURGICAL HISTORY: Past Surgical History:  Procedure Laterality Date  . ABDOMINAL HYSTERECTOMY  1977  . BREAST BIOPSY Bilateral 1977   benign    SOCIAL HISTORY: Social History   Socioeconomic History  . Marital status: Widowed    Spouse name: Not on file  . Number of children: 1  . Years of education: H/S  . Highest education level: Not on file  Occupational History  . Occupation: Retired  Scientific laboratory technician  . Financial resource strain: Not on file  . Food insecurity:    Worry: Not on file    Inability: Not on file  . Transportation needs:    Medical: Not on file    Non-medical: Not on file  Tobacco Use  . Smoking status: Never Smoker  . Smokeless tobacco: Never Used  Substance and Sexual Activity  . Alcohol use: No  . Drug use: No  . Sexual activity: Not on file  Lifestyle  . Physical activity:    Days per week: Not on file    Minutes per session: Not on file  . Stress: Not on file  Relationships  . Social  connections:    Talks on phone: Not on file    Gets together: Not on file    Attends religious service: Not on file    Active member of club or organization: Not on file    Attends meetings of clubs or organizations: Not on file    Relationship status: Not  on file  . Intimate partner violence:    Fear of current or ex partner: Not on file    Emotionally abused: Not on file    Physically abused: Not on file    Forced sexual activity: Not on file  Other Topics Concern  . Not on file  Social History Narrative  . Not on file    FAMILY HISTORY: Family History  Problem Relation Age of Onset  . Heart attack Mother   . Congestive Heart Failure Mother   . Stroke Father   . Colon cancer Sister   . Throat cancer Maternal Grandmother   . Healthy Daughter     ALLERGIES:  has No Known Allergies.  MEDICATIONS:  Current Outpatient Medications  Medication Sig Dispense Refill  . amLODipine (NORVASC) 5 MG tablet take 1 tablet by mouth once daily 90 tablet 1  . Cholecalciferol (VITAMIN D PO) Take 1 capsule by mouth daily.    . clobetasol ointment (TEMOVATE) 0.05 % APPLY TO AFFECTED AREAS ONCE WEEKLY  0  . Coenzyme Q10 (CO Q 10) 60 MG CAPS Take 1 capsule by mouth daily.     Marland Kitchen gabapentin (NEURONTIN) 600 MG tablet TAKE 1 TABLET BY MOUTH AT BEDTIME 90 tablet 3  . hydrochlorothiazide (HYDRODIURIL) 12.5 MG tablet take 1 tablet by mouth once daily 90 tablet 1  . latanoprost (XALATAN) 0.005 % ophthalmic solution Place 1 drop into both eyes at bedtime.   0  . Multiple Vitamin (MULTIVITAMIN) tablet Take 1 tablet by mouth daily.    . multivitamin-lutein (OCUVITE-LUTEIN) CAPS capsule Take 1 capsule by mouth daily.    . polyethylene glycol powder (MIRALAX) powder Take 1 Container by mouth daily.     Marland Kitchen PROBIOTIC PRODUCT PO Take by mouth.    . mupirocin ointment (BACTROBAN) 2 % apply to affected area twice a day  0  . nystatin cream (MYCOSTATIN) Apply 1 application topically 2 (two) times daily. (Patient not taking: Reported on 07/09/2018) 30 g 0   No current facility-administered medications for this visit.      PHYSICAL EXAMINATION: ECOG PERFORMANCE STATUS: 0 - Asymptomatic Vitals:   07/09/18 1120  BP: (!) 149/74  Pulse: 70  Resp: 18  Temp: (!) 96.8  F (36 C)   Filed Weights   07/09/18 1120  Weight: 182 lb 14.4 oz (83 kg)    Physical Exam  Constitutional: She is oriented to person, place, and time. She appears well-developed and well-nourished. No distress.  HENT:  Head: Normocephalic and atraumatic.  Mouth/Throat: Oropharynx is clear and moist.  Eyes: Pupils are equal, round, and reactive to light. Conjunctivae and EOM are normal. No scleral icterus.  Neck: Normal range of motion. Neck supple.  Cardiovascular: Normal rate, regular rhythm and normal heart sounds.  Pulmonary/Chest: Effort normal. No respiratory distress. She has no wheezes. She has no rales. She exhibits no tenderness.  Abdominal: Soft. Bowel sounds are normal. She exhibits no distension and no mass. There is no tenderness.  Musculoskeletal: Normal range of motion. She exhibits no edema or deformity.  Lymphadenopathy:    She has no cervical adenopathy.  Neurological: She is alert and oriented to person,  place, and time. No cranial nerve deficit. Coordination normal.  Skin: Skin is warm and dry. No rash noted.  Psychiatric: She has a normal mood and affect. Her behavior is normal. Thought content normal.   Breast exam was performed in seated and lying down position.  No evidence of any palpable masses bilaterally. No evidence of bilateral axillary adenopathy  LABORATORY DATA:  I have reviewed the data as listed Lab Results  Component Value Date   WBC 5.5 07/09/2018   HGB 14.1 07/09/2018   HCT 41.7 07/09/2018   MCV 93.7 07/09/2018   PLT 180 07/09/2018   Recent Labs    08/22/17 10/10/17 1131 07/09/18 1315  NA  --  139 141  K  --  4.0 4.2  CL  --  102 106  CO2  --  31 27  GLUCOSE  --  89 105*  BUN  --  15 15  CREATININE 0.8 0.94* 0.94  CALCIUM  --  9.5 9.5  GFRNONAA  --  55* 53*  GFRAA  --  64 >60  PROT  --  6.6 7.2  ALBUMIN  --   --  4.3  AST  --  19 26  ALT  --  15 21  ALKPHOS  --   --  41  BILITOT  --  0.5 0.8   Iron/TIBC/Ferritin/  %Sat No results found for: IRON, TIBC, FERRITIN, IRONPCTSAT      ASSESSMENT & PLAN:  1. Malignant neoplasm of female breast, unspecified estrogen receptor status, unspecified laterality, unspecified site of breast ([place])   2. HER2-positive carcinoma of breast ([place])   3. Neuropathy    # Images were independantly reviewed by me and discussed. Pathology results were discussed in details.  We discussed about breast cancer diagnosis, extent of disease, and management plan based on current clinical information.  Patient has cT1 cN0 HER 2 + breast cancer, recommend lumpectomy with sentinel lymph node biopsy.  Recommend adjuvant Transtuzumab and Paclitaxel. She may have difficulty tolerating Paclitaxel.  Patient has pre-existing neuropathy and takes Gabapentin '600mg'$  at bedtime. Paclitaxel potentially can worsen her pre-existing neuropathy.  Adjuvant RT.   Will present patient's case on breast cancer tumor board.  Refer to Dr.Byrnett for surgery evaluation.  Patient and her niece had many questions and I answered all to their satisfaction.  Orders Placed This Encounter  Procedures  . CBC with Differential/Platelet    Standing Status:   Future    Number of Occurrences:   1    Standing Expiration Date:   07/09/2019  . Comprehensive metabolic panel    Standing Status:   Future    Number of Occurrences:   1    Standing Expiration Date:   07/09/2019  . Cancer antigen 15-3    Standing Status:   Future    Number of Occurrences:   1    Standing Expiration Date:   07/10/2019  . Cancer antigen 27.29    Standing Status:   Future    Number of Occurrences:   1    Standing Expiration Date:   07/10/2019  . Ambulatory referral to General Surgery    Referral Priority:   Routine    Referral Type:   Surgical    Referral Reason:   Specialty Services Required    Referred to Provider:   Robert Bellow, MD    Requested Specialty:   General Surgery    Number of Visits Requested:   1     The patient  knows to call the clinic with any problems questions or concerns.  Return of visit: to be determined.  Thank you for this kind referral and the opportunity to participate in the care of this patient. A copy of today's note is routed to referring provider  Total face to face encounter time for this patient visit was 60 min. >50% of the time was  spent in counseling and coordination of care.    Earlie Server, MD, PhD Hematology Oncology [facility] Pager- [phone] 07/09/2018

## 2018-07-09 NOTE — Progress Notes (Signed)
  Oncology Nurse Navigator Documentation  Navigator Location: [facility]-Med Onc (07/09/18 1400) Referral date to RadOnc/MedOnc: 07/09/18 (07/09/18 1400) )Navigator Encounter Type: Initial MedOnc (07/09/18 1400)   Abnormal Finding Date: 06/22/18 (07/09/18 1400) Confirmed Diagnosis Date: 06/28/18 (07/09/18 1400)                 Treatment Phase: Pre-Tx/Tx Discussion (07/09/18 1400) Barriers/Navigation Needs: Education;Coordination of Care (07/09/18 1400) Education: Newly Diagnosed Cancer Education (07/09/18 1400) Interventions: Coordination of Care;Education (07/09/18 1400)   Coordination of Care: Appts (07/09/18 1400) Education Method: Verbal;Written (07/09/18 1400)                Time Spent with Patient: 120 (07/09/18 1400)   Met patient and Kelly Clark today during Kelly initial medical oncology consult with Dr. Tasia Catchings.  Patient has ER/PR negative / Her2 positive invasive breast cancer.  Dr. Tasia Catchings discussed possible chemo/targeted therapy or targeted therapy alone.  She has been referred to Dr. Bary Castilla on 07/11/18 @ 3:30.  Kelly Clark, Kelly Clark was notifed per request of Kelly appointment time.  Patient informed to call me with surgical date and I can arrange follow-up with Dr. Tasia Catchings for 1 week after Kelly surgery.  Gave patient breast cancer educational literature, "My Breast Cancer Treatment Handbook" by Kelly Igo, Kelly Clark.  She is to call with any questions or needs.

## 2018-07-09 NOTE — Progress Notes (Signed)
Patient here for initial visit. °

## 2018-07-10 ENCOUNTER — Encounter: Payer: Self-pay | Admitting: *Deleted

## 2018-07-10 LAB — CANCER ANTIGEN 27.29: CA 27.29: 18.8 U/mL (ref 0.0–38.6)

## 2018-07-10 LAB — CANCER ANTIGEN 15-3: CA 15-3: 14.5 U/mL (ref 0.0–25.0)

## 2018-07-11 ENCOUNTER — Ambulatory Visit: Payer: Self-pay

## 2018-07-11 ENCOUNTER — Ambulatory Visit (INDEPENDENT_AMBULATORY_CARE_PROVIDER_SITE_OTHER): Payer: PPO | Admitting: General Surgery

## 2018-07-11 ENCOUNTER — Encounter: Payer: Self-pay | Admitting: General Surgery

## 2018-07-11 VITALS — BP 140/70 | HR 84 | Resp 14 | Ht 62.0 in | Wt 180.0 lb

## 2018-07-11 DIAGNOSIS — C50211 Malignant neoplasm of upper-inner quadrant of right female breast: Secondary | ICD-10-CM

## 2018-07-11 DIAGNOSIS — Z171 Estrogen receptor negative status [ER-]: Secondary | ICD-10-CM

## 2018-07-11 NOTE — Progress Notes (Signed)
Patient ID: Kelly Clark, female   DOB: [birth year], 84 y.o.   MRN: [id number]  Chief Complaint  Patient presents with  . Breast Problem    HPI Kelly Clark is a 84 y.o. female.  who presents for a breast evaluation referred by Dr Tasia Catchings. The most recent mammogram and right breast biopsy was done on 06-27-18.  Patient does not perform regular self breast checks and gets regular mammograms done.   She could not feel anything different in the breast prior to her mammogram. She goes to the gym 3 days a week. She is here with her niece, Fanny Bien, since her daughter lives in [place]. She is retired from [employer]. She saw Dr Tasia Catchings on 07-09-18.  HPI  Past Medical History:  Diagnosis Date  . Basal cell carcinoma 2019  . Breast cancer ([place]) 06/27/2018  . Cancer ([place]) 1977   melanoma  . Hypertension     Past Surgical History:  Procedure Laterality Date  . ABDOMINAL HYSTERECTOMY  1977  . BREAST BIOPSY Bilateral 1977   benign  . BREAST BIOPSY Right 06/27/2018   INVASIVE MAMMARY CARCINOMA, NO SPECIAL TYPE. 2 oclock ER/PR negative HER2 positive  . COLONOSCOPY  2007    Family History  Problem Relation Age of Onset  . Heart attack Mother   . Congestive Heart Failure Mother   . Stroke Father   . Colon cancer Sister 50  . Throat cancer Maternal Grandmother 76  . Healthy Daughter     Social History Social History   Tobacco Use  . Smoking status: Never Smoker  . Smokeless tobacco: Never Used  Substance Use Topics  . Alcohol use: No  . Drug use: No    No Known Allergies  Current Outpatient Medications  Medication Sig Dispense Refill  . amLODipine (NORVASC) 5 MG tablet take 1 tablet by mouth once daily 90 tablet 1  . Cholecalciferol (VITAMIN D PO) Take 1 capsule by mouth daily.    . clobetasol ointment (TEMOVATE) 0.05 % APPLY TO AFFECTED AREAS ONCE WEEKLY  0  . Coenzyme Q10 (CO Q 10) 60 MG CAPS Take 1 capsule by mouth daily.     Marland Kitchen gabapentin (NEURONTIN) 600 MG tablet TAKE 1  TABLET BY MOUTH AT BEDTIME 90 tablet 3  . hydrochlorothiazide (HYDRODIURIL) 12.5 MG tablet take 1 tablet by mouth once daily 90 tablet 1  . latanoprost (XALATAN) 0.005 % ophthalmic solution Place 1 drop into both eyes at bedtime.   0  . Multiple Vitamin (MULTIVITAMIN) tablet Take 1 tablet by mouth daily.    . multivitamin-lutein (OCUVITE-LUTEIN) CAPS capsule Take 1 capsule by mouth daily.    . polyethylene glycol powder (MIRALAX) powder Take 1 Container by mouth daily.     Marland Kitchen PROBIOTIC PRODUCT PO Take by mouth.     No current facility-administered medications for this visit.     Review of Systems Review of Systems  Constitutional: Negative.   Respiratory: Negative.   Cardiovascular: Negative.     Blood pressure 140/70, pulse 84, resp. rate 14, height '5\' 2"'$  (1.575 m), weight 180 lb (81.6 kg), SpO2 95 %.  Physical Exam Physical Exam  Constitutional: She is oriented to person, place, and time. She appears well-developed and well-nourished.  HENT:  Mouth/Throat: Oropharynx is clear and moist.  Eyes: Conjunctivae are normal. No scleral icterus.  Neck: Neck supple.  Cardiovascular: Normal rate, regular rhythm and normal heart sounds.  Pulmonary/Chest: Effort normal and breath sounds normal. Right breast exhibits no inverted nipple,  no mass, no nipple discharge, no skin change and no tenderness. Left breast exhibits no inverted nipple, no mass, no nipple discharge, no skin change and no tenderness.  Lymphadenopathy:    She has no cervical adenopathy.    She has no axillary adenopathy.  Neurological: She is alert and oriented to person, place, and time.  Skin: Skin is warm and dry.  Psychiatric: Her behavior is normal.    Data Reviewed December 2018 through July 2019 imaging studies reviewed.  Process in the right breast identified in December 2018.  Diagnostic mammograms completed January 2, 2019Suggested a 4 mm mass.  It was elected to undertake a six-month follow-up exam.  This was  subsequently completed on June 22, 2018.  At this time a 6 mm mass in the upper inner quadrant of the right breast with spiculated borders was identified and confirmed on ultrasound.The patient subsequently underwent ultrasound-guided biopsy on June 27, 2018.  DIAGNOSIS:  A. RIGHT BREAST, 2:00, 9 CMFN; ULTRASOUND-GUIDED CORE BIOPSY:  - INVASIVE MAMMARY CARCINOMA, NO SPECIAL TYPE.   Size of invasive carcinoma: 4 mm in this sample  Histologic grade of invasive carcinoma: Grade 2            Glandular/tubular differentiation score: 3            Nuclear pleomorphism score: 2            Mitotic rate score: 1            Total score: 6  Ductal carcinoma in situ: Not identified  Lymphovascular invasion: Not identified   BREAST BIOMARKER TESTS  Estrogen Receptor (ER) Status: NEGATIVE  Progesterone Receptor (PgR) Status: NEGATIVE  HER2 (by immunohistochemistry): POSITIVE (Score 3+)   Laboratory studies of July 09, 2018 including CBC, comprehensive metabolic panel, CA 83-38 and CA 15-3 reviewed.  All normal.  Medical oncology notes of July 09, 2018 reviewed: She was recommended for adjuvant Chemotherapy and targeted anti-HER-2 therapy.  Ultrasound examination was undertaken with the idea the patient is leaning towards breast conservation.  In the 3 o'clock position 9 cm from the nipple a taller rather than wide hypoechoic mass with posterior acoustic shadowing measuring 0.35 x 0.4 x 0.41 with an enclosed biopsy clip is identified.  BI-RADS-6.  Assessment    Stage I carcinoma of the right breast.    Plan     The majority of the visit was spent reviewing the options for breast cancer treatment. Breast conservation with lumpectomy and radiation therapy  was presented as equivalent to mastectomy for long-term control. The pros and cons of each treatment regimen were reviewed.  Discussion regarding indications for adjuvant therapy based on tumor size  reviewed.  5 mm is the cutoff for consideration of adjuvant therapy for HER-2/neu positive tumors.  4 mm on biopsy, 6 mm and ultrasound.  Formal resection diameter will be important.    Opportunity for second surgical opinion reviewed.  Role of adjuvant radiation therapy in her age bracket was discussed.  Soft dated to suggest its beneficial at this age, the patient is in excellent health and does appear to have a 10-year life expectancy.  Pros and cons of sentinel node biopsy reviewed, especially of with a far medial lesion.  Will discuss with medical oncology their thoughts on whether this would be ground breaking information.  Likelihood of a positive node with a 4 mm tumor is less than 4%.  With its far medial location there be a greater likelihood for spread internal mammary  chain which would not likely be identified on CT imaging.   The patient was encouraged to contact the office if she had any concerns or additional questions.  Should she desire to proceed with surgery she was encouraged to call for scheduling.   HPI, Physical Exam, Assessment and Plan have been scribed under the direction and in the presence of Robert Bellow, MD. Karie Fetch, RN  I have completed the exam and reviewed the above documentation for accuracy and completeness.  I agree with the above.  Haematologist has been used and any errors in dictation or transcription are unintentional.  Hervey Ard, M.D., F.A.C.S. [facility] [place] 07/11/2018, 9:48 PM

## 2018-07-11 NOTE — Patient Instructions (Signed)
The patient is aware to call back for any questions or concerns.  

## 2018-07-13 ENCOUNTER — Other Ambulatory Visit: Payer: Self-pay | Admitting: General Surgery

## 2018-07-13 NOTE — Progress Notes (Signed)
Patient has a mammographic lesion measuring 6 mm, 4 mm on pathologic biopsy.  HER-2/neu positive.  This would be a T1b lesion at worst based on present imaging.  Medical oncology notes of July 09, 2018 reported this was a T1c lesion.  Discussed options for management with the patient.  Medical oncology was interested in the sentinel node biopsy and this will be completed.  The patient is interested in wide excision.  Final recommendations for radiation therapy based on her age will be obtained after surgery.

## 2018-07-16 ENCOUNTER — Telehealth: Payer: Self-pay

## 2018-07-16 ENCOUNTER — Other Ambulatory Visit: Payer: Self-pay

## 2018-07-16 DIAGNOSIS — Z171 Estrogen receptor negative status [ER-]: Principal | ICD-10-CM

## 2018-07-16 DIAGNOSIS — C50211 Malignant neoplasm of upper-inner quadrant of right female breast: Secondary | ICD-10-CM

## 2018-07-16 MED ORDER — LIDOCAINE-PRILOCAINE 2.5-2.5 % EX CREA: 1.0000 "application " | TOPICAL_CREAM | CUTANEOUS | 0 refills | Status: DC | PRN

## 2018-07-16 NOTE — Telephone Encounter (Signed)
The patient has been scheduled for surgery at [facility] on 07/23/18. She will pre admit at the hospital on 07/17/18 at 8:15 am. She will report to the [facility] desk on 07/23/18 at 1:15 pm. She will make use of EMLA cream to the right areola and cover with plastic wrap on hour prior to leaving for the hospital on 07/23/18. The patient is aware of dates, times, and instructions.

## 2018-07-17 ENCOUNTER — Encounter
Admission: RE | Admit: 2018-07-17 | Discharge: 2018-07-17 | Disposition: A | Discharge status: Home / Self Care | Payer: PPO | Source: Ambulatory Visit | Attending: General Surgery | Admitting: General Surgery

## 2018-07-17 ENCOUNTER — Other Ambulatory Visit: Payer: Self-pay

## 2018-07-17 DIAGNOSIS — Z01818 Encounter for other preprocedural examination: Secondary | ICD-10-CM | POA: Insufficient documentation

## 2018-07-17 DIAGNOSIS — Z01810 Encounter for preprocedural cardiovascular examination: Secondary | ICD-10-CM | POA: Diagnosis not present

## 2018-07-17 DIAGNOSIS — Z171 Estrogen receptor negative status [ER-]: Secondary | ICD-10-CM | POA: Diagnosis not present

## 2018-07-17 DIAGNOSIS — C50211 Malignant neoplasm of upper-inner quadrant of right female breast: Secondary | ICD-10-CM | POA: Diagnosis not present

## 2018-07-17 NOTE — Patient Instructions (Signed)
Your procedure is scheduled on: Monday, July 23, 2018  Report to [facility]   DO NOT STOP ON THE FIRST FLOOR TO REGISTER  To find out your arrival time please call [phone] between 1PM - 3PM on Friday, July 20, 2018  Remember: Instructions that are not followed completely may result in serious medical risk,  up to and including death, or upon the discretion of your surgeon and anesthesiologist your  surgery may need to be rescheduled.     _X__ 1. Do not eat food after midnight the night before your procedure.                 No gum chewing or hard candies.ABSOLUTELY NOTHING IN YOUR MOUTH AFTER MIDNIGHT                  You may drink clear liquids up to 2 hours before you are scheduled to arrive for your surgery-                   DO not drink clear liquids within 2 hours of the start of your surgery.                  Clear Liquids include:  water, apple juice without pulp, clear carbohydrate                 drink such as Clearfast of Gatorade, Black Coffee or Tea (Do not add                 anything to coffee or tea).  __X__2.  On the morning of surgery brush your teeth with toothpaste and water,                  You may rinse your mouth with mouthwash if you wish.                    Do not swallow any toothpaste of mouthwash.     _X__ 3.  No Alcohol for 24 hours before or after surgery.   _X__ 4.  Do Not Smoke or use e-cigarettes For 24 Hours Prior to Your Surgery.                 Do not use any chewable tobacco products for at least 6 hours prior to                 surgery.  ____  5.  Bring all medications with you on the day of surgery if instructed.   ____  6.  Notify your doctor if there is any change in your medical condition      (cold, fever, infections).     Do not wear jewelry, make-up, hairpins, clips or nail polish. Do not wear lotions, powders, or perfumes. You may NOT wear deodorant. Do not shave 48 hours  prior to surgery. Men may shave face and neck. Do not bring valuables to the hospital.    [facility] is not responsible for any belongings or valuables.  Contacts, dentures or bridgework may not be worn into surgery. Leave your suitcase in the car. After surgery it may be brought to your room. For patients admitted to the hospital, discharge time is determined by your treatment team.   Patients discharged the day of surgery will not be allowed to drive home.   Please read over the following fact sheets that you were given:  PREPARING FOR SURGERY   ____ Take these medicines the morning of surgery with A SIP OF WATER:    1. AMLODIPINE  2.   3.   4.  5.  6.  ____ Fleet Enema (as directed)   __X__ Use CHG Soap as directed  _X___ Stop ALL ASPIRIN PRODUCTS TODAY  __X__ Stop Anti-inflammatories AS OF TODAY                THIS INCLUDES IBUPROFEN / MOTRIN / ADVIL / ALEVE                    TYLENOL IS OKAY TO USE AT ANY TIME   __X__ Stop supplements until after surgery.    ____ Bring C-Pap to the hospital.   CONTINUE TAKING HYDROCHLOROTHIAZIDE AS SCHEDULED BUT DO NOT TAKE           ON THE MORNING OF SURGERY  YOU MAY CONTINUE TO TAKE:            VITAMIN D            TEMOVATE            GABAPENTIN            XALATAN EYE DROPS  WEAR A LOOSE FITTING SHIRT OR A BUTTON DOWN ONE ON THE DAY OF SURGERY

## 2018-07-23 ENCOUNTER — Ambulatory Visit: Payer: PPO | Admitting: Registered Nurse

## 2018-07-23 ENCOUNTER — Encounter
Admission: RE | Admit: 2018-07-23 | Discharge: 2018-07-23 | Disposition: A | Discharge status: Home / Self Care | Payer: PPO | Source: Ambulatory Visit | Attending: General Surgery | Admitting: General Surgery

## 2018-07-23 ENCOUNTER — Other Ambulatory Visit: Payer: Self-pay

## 2018-07-23 ENCOUNTER — Ambulatory Visit
Admission: RE | Admit: 2018-07-23 | Discharge: 2018-07-23 | Disposition: A | Discharge status: Home / Self Care | Payer: PPO | Source: Ambulatory Visit | Attending: General Surgery | Admitting: General Surgery

## 2018-07-23 ENCOUNTER — Encounter
Admission: RE | Disposition: A | Discharge status: Home / Self Care | Payer: Self-pay | Source: Ambulatory Visit | Attending: General Surgery

## 2018-07-23 DIAGNOSIS — D1809 Hemangioma of other sites: Secondary | ICD-10-CM | POA: Insufficient documentation

## 2018-07-23 DIAGNOSIS — C50911 Malignant neoplasm of unspecified site of right female breast: Secondary | ICD-10-CM

## 2018-07-23 DIAGNOSIS — Z79899 Other long term (current) drug therapy: Secondary | ICD-10-CM | POA: Insufficient documentation

## 2018-07-23 DIAGNOSIS — Z85820 Personal history of malignant melanoma of skin: Secondary | ICD-10-CM | POA: Insufficient documentation

## 2018-07-23 DIAGNOSIS — Z171 Estrogen receptor negative status [ER-]: Secondary | ICD-10-CM | POA: Insufficient documentation

## 2018-07-23 DIAGNOSIS — C50211 Malignant neoplasm of upper-inner quadrant of right female breast: Secondary | ICD-10-CM | POA: Diagnosis not present

## 2018-07-23 HISTORY — PX: BREAST LUMPECTOMY WITH SENTINEL LYMPH NODE BIOPSY: SHX5597

## 2018-07-23 HISTORY — PX: BREAST EXCISIONAL BIOPSY: SUR124

## 2018-07-23 SURGERY — BREAST LUMPECTOMY WITH SENTINEL LYMPH NODE BX
Anesthesia: General | Laterality: Right | Wound class: "Clean "

## 2018-07-23 MED ORDER — ACETAMINOPHEN 10 MG/ML IV SOLN
INTRAVENOUS | Status: AC
  Filled 2018-07-23: qty 100

## 2018-07-23 MED ORDER — FAMOTIDINE 20 MG PO TABS
20.0000 mg | ORAL_TABLET | Freq: Once | ORAL | Status: AC
  Administered 2018-07-23: 20 mg via ORAL

## 2018-07-23 MED ORDER — LACTATED RINGERS IV SOLN
INTRAVENOUS | Status: DC
  Administered 2018-07-23: 14:00:00 via INTRAVENOUS

## 2018-07-23 MED ORDER — DEXAMETHASONE SODIUM PHOSPHATE 10 MG/ML IJ SOLN
INTRAMUSCULAR | Status: DC | PRN
  Administered 2018-07-23: 10 mg via INTRAVENOUS

## 2018-07-23 MED ORDER — ONDANSETRON HCL 4 MG/2ML IJ SOLN
INTRAMUSCULAR | Status: DC | PRN
  Administered 2018-07-23: 4 mg via INTRAVENOUS

## 2018-07-23 MED ORDER — HYDROCODONE-ACETAMINOPHEN 5-325 MG PO TABS: 1.0000 | ORAL_TABLET | ORAL | 0 refills | Status: DC | PRN

## 2018-07-23 MED ORDER — DEXAMETHASONE SODIUM PHOSPHATE 10 MG/ML IJ SOLN
INTRAMUSCULAR | Status: AC
  Filled 2018-07-23: qty 1

## 2018-07-23 MED ORDER — PROPOFOL 10 MG/ML IV BOLUS
INTRAVENOUS | Status: DC | PRN
  Administered 2018-07-23: 150 mg via INTRAVENOUS

## 2018-07-23 MED ORDER — LIDOCAINE HCL (CARDIAC) PF 100 MG/5ML IV SOSY
PREFILLED_SYRINGE | INTRAVENOUS | Status: DC | PRN
  Administered 2018-07-23: 80 mg via INTRAVENOUS

## 2018-07-23 MED ORDER — BUPIVACAINE-EPINEPHRINE (PF) 0.5% -1:200000 IJ SOLN
INTRAMUSCULAR | Status: AC
  Filled 2018-07-23: qty 30

## 2018-07-23 MED ORDER — PROPOFOL 10 MG/ML IV BOLUS
INTRAVENOUS | Status: AC
  Filled 2018-07-23: qty 20

## 2018-07-23 MED ORDER — PHENYLEPHRINE HCL 10 MG/ML IJ SOLN
INTRAMUSCULAR | Status: DC | PRN
  Administered 2018-07-23 (×2): 100 ug via INTRAVENOUS

## 2018-07-23 MED ORDER — FENTANYL CITRATE (PF) 100 MCG/2ML IJ SOLN
INTRAMUSCULAR | Status: DC | PRN
  Administered 2018-07-23 (×2): 25 ug via INTRAVENOUS
  Administered 2018-07-23: 50 ug via INTRAVENOUS

## 2018-07-23 MED ORDER — EPHEDRINE SULFATE 50 MG/ML IJ SOLN
INTRAMUSCULAR | Status: DC | PRN
  Administered 2018-07-23: 10 mg via INTRAVENOUS

## 2018-07-23 MED ORDER — FAMOTIDINE 20 MG PO TABS
ORAL_TABLET | ORAL | Status: AC
  Administered 2018-07-23: 20 mg via ORAL
  Filled 2018-07-23: qty 1

## 2018-07-23 MED ORDER — GLYCOPYRROLATE 0.2 MG/ML IJ SOLN
INTRAMUSCULAR | Status: DC | PRN
  Administered 2018-07-23: 0.2 mg via INTRAVENOUS

## 2018-07-23 MED ORDER — BUPIVACAINE-EPINEPHRINE (PF) 0.5% -1:200000 IJ SOLN
INTRAMUSCULAR | Status: DC | PRN
  Administered 2018-07-23: 10 mL via PERINEURAL
  Administered 2018-07-23: 20 mL via PERINEURAL

## 2018-07-23 MED ORDER — FENTANYL CITRATE (PF) 100 MCG/2ML IJ SOLN: 25.0000 ug | INTRAMUSCULAR | Status: DC | PRN

## 2018-07-23 MED ORDER — LIDOCAINE HCL (PF) 2 % IJ SOLN
INTRAMUSCULAR | Status: AC
  Filled 2018-07-23: qty 10

## 2018-07-23 MED ORDER — TECHNETIUM TC 99M SULFUR COLLOID FILTERED
2.0000 | Freq: Once | INTRAVENOUS | Status: AC | PRN
  Administered 2018-07-23: 0.78 via INTRADERMAL

## 2018-07-23 MED ORDER — ACETAMINOPHEN 10 MG/ML IV SOLN
INTRAVENOUS | Status: DC | PRN
  Administered 2018-07-23: 1000 mg via INTRAVENOUS

## 2018-07-23 MED ORDER — FENTANYL CITRATE (PF) 100 MCG/2ML IJ SOLN
INTRAMUSCULAR | Status: AC
  Filled 2018-07-23: qty 2

## 2018-07-23 MED ORDER — ONDANSETRON HCL 4 MG/2ML IJ SOLN
4.0000 mg | Freq: Once | INTRAMUSCULAR | Status: DC | PRN
Start: 2018-07-23 — End: 2018-07-23

## 2018-07-23 MED ORDER — METHYLENE BLUE 0.5 % INJ SOLN
INTRAVENOUS | Status: AC
  Filled 2018-07-23: qty 10

## 2018-07-23 MED ORDER — ONDANSETRON HCL 4 MG/2ML IJ SOLN
INTRAMUSCULAR | Status: AC
  Filled 2018-07-23: qty 2

## 2018-07-23 MED ORDER — METHYLENE BLUE 0.5 % INJ SOLN
INTRAVENOUS | Status: DC | PRN
  Administered 2018-07-23: 5 mL via SUBMUCOSAL

## 2018-07-23 SURGICAL SUPPLY — 56 items
BINDER BREAST LRG (GAUZE/BANDAGES/DRESSINGS) ×1 IMPLANT
BINDER BREAST MEDIUM (GAUZE/BANDAGES/DRESSINGS) IMPLANT
BINDER BREAST XLRG (GAUZE/BANDAGES/DRESSINGS) IMPLANT
BINDER BREAST XXLRG (GAUZE/BANDAGES/DRESSINGS) IMPLANT
BLADE SURG 15 STRL SS SAFETY (BLADE) ×4 IMPLANT
BULB RESERV EVAC DRAIN JP 100C (MISCELLANEOUS) IMPLANT
CANISTER SUCT 1200ML W/VALVE (MISCELLANEOUS) ×2 IMPLANT
CHLORAPREP W/TINT 26ML (MISCELLANEOUS) ×2 IMPLANT
CNTNR SPEC 2.5X3XGRAD LEK (MISCELLANEOUS)
CONT SPEC 4OZ STER OR WHT (MISCELLANEOUS)
CONTAINER SPEC 2.5X3XGRAD LEK (MISCELLANEOUS) IMPLANT
COVER PROBE FLX POLY STRL (MISCELLANEOUS) ×2 IMPLANT
DEVICE DUBIN SPECIMEN MAMMOGRA (MISCELLANEOUS) ×2 IMPLANT
DRAIN CHANNEL JP 15F RND 16 (MISCELLANEOUS) IMPLANT
DRAPE LAPAROTOMY TRNSV 106X77 (MISCELLANEOUS) ×2 IMPLANT
DRSG GAUZE FLUFF 36X18 (GAUZE/BANDAGES/DRESSINGS) ×3 IMPLANT
DRSG TELFA 3X8 NADH (GAUZE/BANDAGES/DRESSINGS) ×2 IMPLANT
ELECT CAUTERY BLADE TIP 2.5 (TIP) ×2
ELECT REM PT RETURN 9FT ADLT (ELECTROSURGICAL) ×2
ELECTRODE CAUTERY BLDE TIP 2.5 (TIP) ×1 IMPLANT
ELECTRODE REM PT RTRN 9FT ADLT (ELECTROSURGICAL) ×1 IMPLANT
GAUZE SPONGE 4X4 12PLY STRL (GAUZE/BANDAGES/DRESSINGS) ×2 IMPLANT
GLOVE BIO SURGEON STRL SZ7.5 (GLOVE) ×3 IMPLANT
GLOVE INDICATOR 8.0 STRL GRN (GLOVE) ×3 IMPLANT
GOWN STRL REUS W/ TWL LRG LVL3 (GOWN DISPOSABLE) ×2 IMPLANT
GOWN STRL REUS W/TWL LRG LVL3 (GOWN DISPOSABLE) ×2
KIT TURNOVER KIT A (KITS) ×2 IMPLANT
LABEL OR SOLS (LABEL) ×2 IMPLANT
MARGIN MAP 10MM (MISCELLANEOUS) ×2 IMPLANT
NDL HYPO 25X1 1.5 SAFETY (NEEDLE) ×2 IMPLANT
NEEDLE HYPO 22GX1.5 SAFETY (NEEDLE) ×2 IMPLANT
NEEDLE HYPO 25X1 1.5 SAFETY (NEEDLE) ×4 IMPLANT
PACK BASIN MINOR ARMC (MISCELLANEOUS) ×2 IMPLANT
PAD DRESSING TELFA 3X8 NADH (GAUZE/BANDAGES/DRESSINGS) ×1 IMPLANT
RETRACTOR RING XSMALL (MISCELLANEOUS) ×1 IMPLANT
RTRCTR WOUND ALEXIS 13CM XS SH (MISCELLANEOUS) ×2
SHEARS FOC LG CVD HARMONIC 17C (MISCELLANEOUS) IMPLANT
SHEARS HARMONIC 9CM CVD (BLADE) ×1 IMPLANT
SLEVE PROBE SENORX GAMMA FIND (MISCELLANEOUS) ×2 IMPLANT
STRIP CLOSURE SKIN 1/2X4 (GAUZE/BANDAGES/DRESSINGS) ×2 IMPLANT
SUT ETHILON 3-0 FS-10 30 BLK (SUTURE) ×2
SUT SILK 2 0 (SUTURE) ×1
SUT SILK 2-0 18XBRD TIE 12 (SUTURE) ×1 IMPLANT
SUT VIC AB 2-0 CT1 (SUTURE) ×2 IMPLANT
SUT VIC AB 2-0 CT1 27 (SUTURE) ×2
SUT VIC AB 2-0 CT1 TAPERPNT 27 (SUTURE) ×3 IMPLANT
SUT VIC AB 3-0 SH 27 (SUTURE) ×2
SUT VIC AB 3-0 SH 27X BRD (SUTURE) ×2 IMPLANT
SUT VIC AB 4-0 FS2 27 (SUTURE) ×4 IMPLANT
SUT VICRYL+ 3-0 144IN (SUTURE) ×2 IMPLANT
SUTURE EHLN 3-0 FS-10 30 BLK (SUTURE) ×1 IMPLANT
SWABSTK COMLB BENZOIN TINCTURE (MISCELLANEOUS) ×2 IMPLANT
SYR 10ML LL (SYRINGE) ×2 IMPLANT
SYR BULB IRRIG 60ML STRL (SYRINGE) ×2 IMPLANT
TAPE TRANSPORE STRL 2 31045 (GAUZE/BANDAGES/DRESSINGS) ×2 IMPLANT
WATER STERILE IRR 1000ML POUR (IV SOLUTION) ×2 IMPLANT

## 2018-07-23 NOTE — Anesthesia Post-op Follow-up Note (Signed)
Anesthesia QCDR form completed.        

## 2018-07-23 NOTE — H&P (Signed)
No change in clinical history or exam. For right breast wide excision, SLN biopsy.

## 2018-07-23 NOTE — Anesthesia Procedure Notes (Signed)
Procedure Name: LMA Insertion Date/Time: 07/23/2018 3:18 PM Performed by: Doreen Salvage, CRNA Pre-anesthesia Checklist: Patient identified, Patient being monitored, Timeout performed, Emergency Drugs available and Suction available Patient Re-evaluated:Patient Re-evaluated prior to induction Oxygen Delivery Method: Circle system utilized Preoxygenation: Pre-oxygenation with 100% oxygen Induction Type: IV induction Ventilation: Mask ventilation without difficulty LMA: LMA inserted LMA Size: 4.0 Tube type: Oral Number of attempts: 1 Placement Confirmation: positive ETCO2 and breath sounds checked- equal and bilateral Tube secured with: Tape Dental Injury: Teeth and Oropharynx as per pre-operative assessment

## 2018-07-23 NOTE — Anesthesia Preprocedure Evaluation (Signed)
Anesthesia Evaluation  Patient identified by MRN, date of birth, ID band Patient awake    Reviewed: Allergy & Precautions, NPO status , Patient's Chart, lab work & pertinent test results  Airway Mallampati: III       Dental   Pulmonary neg pulmonary ROS,    Pulmonary exam normal        Cardiovascular hypertension, Pt. on medications Normal cardiovascular exam     Neuro/Psych negative psych ROS   GI/Hepatic negative GI ROS, Neg liver ROS,   Endo/Other  negative endocrine ROS  Renal/GU negative Renal ROS     Musculoskeletal negative musculoskeletal ROS (+)   Abdominal Normal abdominal exam  (+)   Peds  Hematology negative hematology ROS (+)   Anesthesia Other Findings   Reproductive/Obstetrics                             Anesthesia Physical Anesthesia Plan  ASA: II  Anesthesia Plan: General   Post-op Pain Management:    Induction: Intravenous  PONV Risk Score and Plan:   Airway Management Planned: LMA and Oral ETT  Additional Equipment:   Intra-op Plan:   Post-operative Plan: Extubation in OR  Informed Consent: I have reviewed the patients History and Physical, chart, labs and discussed the procedure including the risks, benefits and alternatives for the proposed anesthesia with the patient or authorized representative who has indicated his/her understanding and acceptance.   Dental advisory given  Plan Discussed with: CRNA and Surgeon  Anesthesia Plan Comments:         Anesthesia Quick Evaluation

## 2018-07-23 NOTE — Anesthesia Postprocedure Evaluation (Signed)
Anesthesia Post Note  Patient: Kelly Clark  Procedure(s) Performed: BREAST LUMPECTOMY WITH SENTINEL LYMPH NODE BX (Right )  Patient location during evaluation: PACU Anesthesia Type: General Level of consciousness: awake and alert and oriented Pain management: pain level controlled Vital Signs Assessment: post-procedure vital signs reviewed and stable Respiratory status: spontaneous breathing, nonlabored ventilation and respiratory function stable Cardiovascular status: blood pressure returned to baseline and stable Postop Assessment: no signs of nausea or vomiting Anesthetic complications: no     Last Vitals:  Vitals:   07/23/18 1731 07/23/18 1748  BP: (!) 143/60 (!) 140/48  Pulse: 81 78  Resp: 16 16  Temp: (!) 36.3 C   SpO2: 93% 96%    Last Pain:  Vitals:   07/23/18 1748  TempSrc:   PainSc: 0-No pain                 Channon Brougher

## 2018-07-23 NOTE — Op Note (Signed)
Preoperative diagnosis: Right breast cancer, desire for breast conservation.  Postoperative diagnosis: Same.  Operative procedure: Right breast wide excision with ultrasound guidance, sentinel node biopsy.  Operating Surgeon: Hervey Ard, MD.  Anesthesia: General by LMA; Marcaine 0.5% with 1 to 200,000 units of epinephrine, 30 cc.  Estimated blood loss: 5 cc.  Clinical note: This 84 year old woman recently had a biopsy of the 3 o'clock position of the right breast showing invasive mammary carcinoma.  She desires breast conservation.  She underwent injection with technetium sulfur colloid prior to the procedure.  Operative note: The patient underwent general anesthesia without difficulty.  The periareolar skin was cleansed with alcohol and 5 cc of 0.5% methylene blue was injected in the subareolar plexus.  The breast chest and axilla were then cleansed with ChloraPrep and draped.  Ultrasound was used to identify the previous biopsy site in the 3 o'clock position of the right breast, 9 cm from the nipple.  Local anesthetic was infiltrated.  A radial incision was made.  The skin was incised and then approximately 1 cm below in the adipose tissue a 3 x 4 x 4 cm block of tissue including the underlying pectoralis fascia was excised, orientated and specimen radiograph obtained.  This showed the clip towards the medial margin but approximately 6-7 mm away.  Gaspar examination by pathology showed the margins to be clear.  While the breast specimen was being processed attention was turned to the axilla.  The node seeker device was used to-identify the area of increased uptake in the lower aspect of the axilla.  Local anesthetic was infiltrated and a transverse incision made.  The skin was incised sharply and remaining dissection was with electrocautery.  The axillary envelope was entered and 2 3 lymph nodes immediately adjacent to one another were identified.  The midportion of these nodes was blue and had  a high as counseled about 550.  The flanking nodes had counts of 300.  Scanning through the axilla after these were removed show no other areas of increased uptake.  After achieving hemostasis the axillary envelope was closed with a 2-0 Vicryl figure-of-eight suture.  The adipose layer was closed in similar fashion.  The skin was closed with a running 4-0 Vicryl subcuticular suture.  The breast was then prepared for closure.  The breast was elevated off the underlying pectoralis muscle circumferentially for approximately 5 cm.  This allowed for the pectoralis fascia to be approximated with interrupted 2-0 Vicryl sutures.  This reconstituted the majority of the volume removed with the wide excision.  The breast parenchyma was then approximated with interrupted 2-0 Vicryl figure-of-eight sutures.  The skin was closed with a running 4-0 Vicryl subcuticular suture.  Benzoin, Steri-Strips, Telfa and dressing applied.  Fluff gauze and a compressive wrap was placed.  The patient tolerated the procedure well was taken to recovery room in stable condition.

## 2018-07-23 NOTE — Discharge Instructions (Signed)

## 2018-07-23 NOTE — OR Nursing (Signed)
Ok to d/c to home without MD visit in postop per Dr. Bary Castilla, tele @ [phone]

## 2018-07-23 NOTE — Transfer of Care (Signed)
Immediate Anesthesia Transfer of Care Note  Patient: Kelly Clark  Procedure(s) Performed: Procedure(s): BREAST LUMPECTOMY WITH SENTINEL LYMPH NODE BX (Right)  Patient Location: PACU  Anesthesia Type:General  Level of Consciousness: sedated  Airway & Oxygen Therapy: Patient Spontanous Breathing and Patient connected to face mask oxygen  Post-op Assessment: Report given to RN and Post -op Vital signs reviewed and stable  Post vital signs: Reviewed and stable  Last Vitals:  Vitals:   07/23/18 1347 07/23/18 1645  BP: (!) 169/80 137/68  Pulse: 72 93  Resp: 18 20  Temp: 36.7 C (!) 36.2 C  SpO2: 42% 70%    Complications: No apparent anesthesia complications

## 2018-07-24 ENCOUNTER — Telehealth: Payer: Self-pay | Admitting: General Surgery

## 2018-07-24 ENCOUNTER — Encounter: Payer: Self-pay | Admitting: General Surgery

## 2018-07-24 ENCOUNTER — Encounter: Payer: Self-pay | Admitting: *Deleted

## 2018-07-24 NOTE — Telephone Encounter (Signed)
Patient called in today stating she had lumpectomy with sentinel lymph node bx done 07-23-18 and was told she would be called with the results on Thursday or Friday of this week. She request her niece Dannielle Burn [phone]) be called instead of her. Patsy understands better and takes great notes so she can explain to the patient.

## 2018-07-24 NOTE — Progress Notes (Signed)
  Oncology Nurse Navigator Documentation  Navigator Location: [facility] (07/24/18 1400)   )Navigator Encounter Type: Telephone (07/24/18 1400) Telephone: [place] Call (07/24/18 1400)     Surgery Date: 07/20/18 (07/24/18 1400)           Treatment Initiated Date: 07/20/18 (07/24/18 1400)   Treatment Phase: Other (07/24/18 1400) Barriers/Navigation Needs: Education;Coordination of Care (07/24/18 1400)   Interventions: Coordination of Care (07/24/18 1400)   Coordination of Care: Appts (07/24/18 1400) Education Method: Verbal (07/24/18 1400)                Time Spent with Patient: 30 (07/24/18 1400)   Called patient today to follow up on her post surgery on Friday.  States she is doing great.  She had a few questions regarding her consult with Dr. Tasia Catchings.  Answered her questions.  Her niece was also on the phone.  She wants to follow-up with Dr. Tasia Catchings the same day she sees Dr. Bary Castilla so her niece can be present.  Appointment scheduled for 07/31/18 @ 10:00.

## 2018-07-25 LAB — SURGICAL PATHOLOGY

## 2018-07-31 ENCOUNTER — Ambulatory Visit (INDEPENDENT_AMBULATORY_CARE_PROVIDER_SITE_OTHER): Payer: PPO | Admitting: General Surgery

## 2018-07-31 ENCOUNTER — Encounter: Payer: Self-pay | Admitting: General Surgery

## 2018-07-31 ENCOUNTER — Encounter: Payer: Self-pay | Admitting: *Deleted

## 2018-07-31 ENCOUNTER — Inpatient Hospital Stay: Payer: PPO | Attending: Oncology | Admitting: Oncology

## 2018-07-31 ENCOUNTER — Other Ambulatory Visit: Payer: Self-pay

## 2018-07-31 ENCOUNTER — Encounter: Payer: Self-pay | Admitting: Oncology

## 2018-07-31 VITALS — BP 144/78 | HR 90 | Temp 97.5°F | Resp 18 | Wt 181.4 lb

## 2018-07-31 VITALS — BP 138/70 | HR 88 | Resp 15 | Ht 62.0 in | Wt 182.0 lb

## 2018-07-31 DIAGNOSIS — I10 Essential (primary) hypertension: Secondary | ICD-10-CM

## 2018-07-31 DIAGNOSIS — C50211 Malignant neoplasm of upper-inner quadrant of right female breast: Secondary | ICD-10-CM | POA: Insufficient documentation

## 2018-07-31 DIAGNOSIS — Z171 Estrogen receptor negative status [ER-]: Secondary | ICD-10-CM

## 2018-07-31 DIAGNOSIS — Z85820 Personal history of malignant melanoma of skin: Secondary | ICD-10-CM | POA: Diagnosis not present

## 2018-07-31 DIAGNOSIS — G629 Polyneuropathy, unspecified: Secondary | ICD-10-CM | POA: Insufficient documentation

## 2018-07-31 DIAGNOSIS — Z79899 Other long term (current) drug therapy: Secondary | ICD-10-CM | POA: Diagnosis not present

## 2018-07-31 DIAGNOSIS — Z800 Family history of malignant neoplasm of digestive organs: Secondary | ICD-10-CM | POA: Insufficient documentation

## 2018-07-31 DIAGNOSIS — C50919 Malignant neoplasm of unspecified site of unspecified female breast: Secondary | ICD-10-CM

## 2018-07-31 NOTE — Progress Notes (Signed)
START ON PATHWAY REGIMEN - Breast   Paclitaxel Weekly + Trastuzumab Weekly:   Administer weekly:     Paclitaxel      Trastuzumab-xxxx      Trastuzumab-xxxx   **Always confirm dose/schedule in your pharmacy ordering system**  Trastuzumab (Maintenance - NO Loading Dose):   A cycle is every 21 days:     Trastuzumab-xxxx   **Always confirm dose/schedule in your pharmacy ordering system**  Patient Characteristics: Postoperative without Neoadjuvant Therapy (Pathologic Staging), Invasive Disease, Adjuvant Therapy, HER2 Positive, ER Negative/Unknown, Node Negative, pT1b, pN0/N1mi, Chemotherapy Indicated Therapeutic Status: Postoperative without Neoadjuvant Therapy (Pathologic Staging) AJCC Grade: G2 AJCC N Category: pN0 AJCC M Category: cM0 ER Status: Negative (-) AJCC 8 Stage Grouping: IA HER2 Status: Positive (+) Oncotype Dx Recurrence Score: Not Appropriate AJCC T Category: pT1b PR Status: Negative (-) Intent of Therapy: Curative Intent, Discussed with Patient 

## 2018-07-31 NOTE — Progress Notes (Signed)
Hematology/Oncology follow up  note [facility] Telephone:([phone] Fax:([phone]) [phone]   Patient Care Team: [name]., MD as PCP - General (Family Medicine) Lorelee Cover., MD as Consulting Physician (Ophthalmology) Oneta Rack, MD as Consulting Physician (Dermatology)  REFERRING PROVIDER: Dr.Gilbert CHIEF COMPLAINTS/REASON FOR VISIT:  Evaluation of breast cancer.  HISTORY OF PRESENTING ILLNESS:  Kelly Clark is a  84 y.o.  female with PMH listed below who was referred to me for evaluation of breast cancer.  Patient had mammogram in 12/01/2017 which showed possible right breast mass.  She had a diagnostic mammogram on 12/20/2017 which showed right breast slightly lower inner quadrant 4 mm mammographically appearing nodule without sonographic correlation.  Recommend patient to have a diagnostic mammogram and ultrasound of the right breast in 6 months. Repeat mammogram diagnostic 06/22/2018 showed 6 mm mass in the upper inner quadrant of the right breast with spiculated borders.  Ultrasound confirmed with irregular hypoechoic mass in the right breast at 2:00 9 cm from nipple measuring 6 x 5 x 5 mm.  Sonographic evaluation of right axilla does not show any enlarged adenopathy.  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  Nipple discharge: Denies Family history, sister had colon cancer, maternal grandmother had throat cancer. Estrogen and progesterone therapy: Admit to estrogen replacement therapy from 19 77-2009. History of radiation to chest: denies.  Previous breast surgery: Previous lumpectomy in 1970s with benign breast pathology.  Patient is very active, lives by herself, independent.   INTERVAL HISTORY Kelly Clark is a 84 y.o. female who has above history reviewed by me today presents for follow up visit for management of  Problems and complaints are listed  below:  Review of Systems  Constitutional: Negative for chills, fever, malaise/fatigue and weight loss.  HENT: Negative for nosebleeds and sore throat.   Eyes: Negative for double vision, photophobia and redness.  Respiratory: Negative for cough, shortness of breath and wheezing.   Cardiovascular: Negative for chest pain, palpitations and orthopnea.  Gastrointestinal: Negative for abdominal pain, blood in stool, nausea and vomiting.  Genitourinary: Negative for dysuria.  Musculoskeletal: Negative for back pain, myalgias and neck pain.  Skin: Negative for itching and rash.  Neurological: Negative for dizziness, tingling and tremors.  Endo/Heme/Allergies: Negative for environmental allergies. Does not bruise/bleed easily.  Psychiatric/Behavioral: Negative for depression.      MEDICAL HISTORY:  Past Medical History:  Diagnosis Date  . Basal cell carcinoma 2019   RIGHT THUMB REGION  . Breast cancer ([place]) 06/27/2018   RIGHT  . Cancer ([place]) 1977   melanoma . SURGERY RIGHT ARM  . Hypertension     SURGICAL HISTORY: Past Surgical History:  Procedure Laterality Date  . ABDOMINAL HYSTERECTOMY  1977  . BREAST BIOPSY Bilateral 1977   benign  . BREAST BIOPSY Right 06/27/2018   INVASIVE MAMMARY CARCINOMA, NO SPECIAL TYPE. 2 oclock ER/PR negative HER2 positive  . BREAST EXCISIONAL BIOPSY Right 07/23/2018   lumpectomy  . BREAST LUMPECTOMY WITH SENTINEL LYMPH NODE BIOPSY Right 07/23/2018   Procedure: BREAST LUMPECTOMY WITH SENTINEL LYMPH NODE BX;  Surgeon: Robert Bellow, MD;  Location: [facility] ORS;  Service: General;  Laterality: Right;  . COLONOSCOPY  2007  . EYE SURGERY Bilateral    CATARACT EXTRACTIONS    SOCIAL HISTORY: Social History   Socioeconomic History  . Marital status: Widowed    Spouse name: Not on file  . Number of  children: 1  . Years of education: H/S  . Highest education level: Not on file  Occupational History  . Occupation: Retired  Scientific laboratory technician  .  Financial resource strain: Not on file  . Food insecurity:    Worry: Not on file    Inability: Not on file  . Transportation needs:    Medical: Not on file    Non-medical: Not on file  Tobacco Use  . Smoking status: Never Smoker  . Smokeless tobacco: Never Used  Substance and Sexual Activity  . Alcohol use: No  . Drug use: No  . Sexual activity: Not Currently  Lifestyle  . Physical activity:    Days per week: Not on file    Minutes per session: Not on file  . Stress: Not on file  Relationships  . Social connections:    Talks on phone: Not on file    Gets together: Not on file    Attends religious service: Not on file    Active member of club or organization: Not on file    Attends meetings of clubs or organizations: Not on file    Relationship status: Not on file  . Intimate partner violence:    Fear of current or ex partner: Not on file    Emotionally abused: Not on file    Physically abused: Not on file    Forced sexual activity: Not on file  Other Topics Concern  . Not on file  Social History Narrative  . Not on file    FAMILY HISTORY: Family History  Problem Relation Age of Onset  . Heart attack Mother   . Congestive Heart Failure Mother   . Stroke Father   . Colon cancer Sister 75  . Throat cancer Maternal Grandmother 79  . Healthy Daughter     ALLERGIES:  has No Known Allergies.  MEDICATIONS:  Current Outpatient Medications  Medication Sig Dispense Refill  . amLODipine (NORVASC) 5 MG tablet take 1 tablet by mouth once daily 90 tablet 1  . Cholecalciferol (VITAMIN D3) 10000 units TABS Take 10,000 Units by mouth at bedtime.    . clobetasol ointment (TEMOVATE) 2.70 % Apply 1 application topically every Saturday at 6 PM. Applied to affected area of vagina and rectum    . gabapentin (NEURONTIN) 600 MG tablet TAKE 1 TABLET BY MOUTH AT BEDTIME 90 tablet 3  . hydrochlorothiazide (HYDRODIURIL) 12.5 MG tablet take 1 tablet by mouth once daily 90 tablet 1  .  HYDROcodone-acetaminophen (NORCO/VICODIN) 5-325 MG tablet Take 1 tablet by mouth every 4 (four) hours as needed for moderate pain. 20 tablet 0  . latanoprost (XALATAN) 0.005 % ophthalmic solution Place 1 drop into both eyes at bedtime.   0  . Multiple Vitamin (MULTIVITAMIN) tablet Take 1 tablet by mouth daily.    . multivitamin-lutein (OCUVITE-LUTEIN) CAPS capsule Take 1 capsule by mouth every morning.     . polyethylene glycol powder (MIRALAX) powder Take 17 g by mouth at bedtime.     Marland Kitchen PROBIOTIC PRODUCT PO Take 1 capsule by mouth daily with lunch.     . RESVERATROL-GRAPE PO Take 1 capsule by mouth daily. CoQ10 Resveratrol & Grape Seed     No current facility-administered medications for this visit.      PHYSICAL EXAMINATION: ECOG PERFORMANCE STATUS: 0 - Asymptomatic Vitals:   07/31/18 1012  BP: (!) 144/78  Pulse: 90  Resp: 18  Temp: (!) 97.5 F (36.4 C)   Filed Weights   07/31/18 1012  Weight: 181 lb 6.4 oz (82.3 kg)    Physical Exam  Constitutional: She is oriented to person, place, and time. She appears well-developed and well-nourished. No distress.  HENT:  Head: Normocephalic and atraumatic.  Mouth/Throat: Oropharynx is clear and moist.  Eyes: Pupils are equal, round, and reactive to light. Conjunctivae and EOM are normal. No scleral icterus.  Neck: Normal range of motion. Neck supple.  Cardiovascular: Normal rate, regular rhythm and normal heart sounds.  Pulmonary/Chest: Effort normal. No respiratory distress. She has no wheezes. She has no rales. She exhibits no tenderness.  Abdominal: Soft. Bowel sounds are normal. She exhibits no distension and no mass. There is no tenderness.  Musculoskeletal: Normal range of motion. She exhibits no edema or deformity.  Lymphadenopathy:    She has no cervical adenopathy.  Neurological: She is alert and oriented to person, place, and time. No cranial nerve deficit. Coordination normal.  Skin: Skin is warm and dry. No rash noted.    Psychiatric: She has a normal mood and affect. Her behavior is normal. Thought content normal.   Breast exam was performed in seated and lying down position.  No evidence of any palpable masses bilaterally. No evidence of bilateral axillary adenopathy  LABORATORY DATA:  I have reviewed the data as listed Lab Results  Component Value Date   WBC 5.5 07/09/2018   HGB 14.1 07/09/2018   HCT 41.7 07/09/2018   MCV 93.7 07/09/2018   PLT 180 07/09/2018   Recent Labs    08/22/17 10/10/17 1131 07/09/18 1315  NA  --  139 141  K  --  4.0 4.2  CL  --  102 106  CO2  --  31 27  GLUCOSE  --  89 105*  BUN  --  15 15  CREATININE 0.8 0.94* 0.94  CALCIUM  --  9.5 9.5  GFRNONAA  --  55* 53*  GFRAA  --  64 >60  PROT  --  6.6 7.2  ALBUMIN  --   --  4.3  AST  --  19 26  ALT  --  15 21  ALKPHOS  --   --  41  BILITOT  --  0.5 0.8   Iron/TIBC/Ferritin/ %Sat No results found for: IRON, TIBC, FERRITIN, IRONPCTSAT      ASSESSMENT & PLAN:  1. Malignant neoplasm of upper-inner quadrant of right breast in female, estrogen receptor negative ([place])   2. HER2-positive carcinoma of breast ([place])   3. Neuropathy    # Pathology was discussed with patient and her niece.  Small HER2 positive breast cancer, pT1b N0 Recommend Adjuvant Chemotherapy.  Explained that standard adjuvant chemotherapy recommends Taxol and Herceptin, which gives excellent 7-year disease-free survival rates of about 93%. Older studies prior to Herceptin era showed 8 year recurrence rate of 17% with chemotherapy.  However given her advanced age and grade 2 pre- existing neuropathy, She is not interested in getting chemotherapy which can worsen her neuropathy.   Given the lack of prospective clinical trial data to compare adjuvant monotherapy with Herceptin to placebo in trials, question remains as to whether there is a place for anti-HER2 therapy without chemotherapy in individually selected patients.  Observational data analysis  of a small but considerable subgroup of patients who received trastuzumab without chemotherapy database of [place] observational study showed about recurrence free rate of 80% in 5 years. historical series of patients with HER2-positive breast cancer who did not receive adjuvant chemotherapy or trastuzumab has RFS about 77% in  5 year.  The observation study showed a possible considerable benefit for monotherapy of Transtuzumab x 1 year.   Recent abstract of report of RESPECT trial [abstract] in which HER2 positive elderly patient , Comparison of restricted mean survival times revealed the lost survival due to omitting chemotherapy was less than 1 month at 3 years. Monotherapy of Herceptin provides better quality of life and is an acceptable option for elderly patient.   Discussed in detail with patient and her niece.  I recommend adjuvant monotherapy Herceptin treatment.       I explained to the patient the risks and benefits of Herceptin including all but not limited to cardiac toxicity, infusion reactions, etc,   patient will need baseline MUGA to determine baseline cardiac  function.  #  Patient and niece have lot of questions and all answered to satisfaction.   Patient says that she feels well and value her current life quality as her priority. She prefers not to take risks for potential side effects from adjuvant treatment and declines  any adjuvant treatment.  She understands that HER 2 positive breast cancer has high recurrence rate despite other low risk factors.  I recommend her to meet Radiation Oncology to discuss adjuvant RT which will reduce local recurrence rate.   Patient and her niece had many questions and I answered all to their satisfaction.  Orders Placed This Encounter  Procedures  . CBC with Differential (Cancer Center Only)    Standing Status:   Standing    Number of Occurrences:   20    Standing Expiration Date:   08/01/2019  . Comprehensive metabolic panel    Standing  Status:   Standing    Number of Occurrences:   20    Standing Expiration Date:   08/01/2019  . Ambulatory referral to Radiation Oncology    Referral Priority:   Routine    Referral Type:   Consultation    Referral Reason:   Specialty Services Required    Referred to Provider:   Noreene Filbert, MD    Requested Specialty:   Radiation Oncology    Number of Visits Requested:   1     The patient knows to call the clinic with any problems questions or concerns.  Return of visit: to be determined.  Total face to face encounter time for this patient visit was 40 min. >50% of the time was  spent in counseling and coordination of care.  Earlie Server, MD, PhD Hematology Oncology [facility] Pager- [phone] 07/31/2018

## 2018-07-31 NOTE — Progress Notes (Signed)
Patient here for follow up. No concerns voiced.  °

## 2018-07-31 NOTE — Progress Notes (Signed)
  Oncology Nurse Navigator Documentation  Navigator Location: [facility] (07/31/18 1400)   )Navigator Encounter Type: Clinic/MDC (07/31/18 1400)                     Patient Visit Type: MedOnc (07/31/18 1400) Treatment Phase: Pre-Tx/Tx Discussion (07/31/18 1400) Barriers/Navigation Needs: Education (07/31/18 1400) Education: Other (07/31/18 1400)       Education Method: Verbal (07/31/18 1400)                Time Spent with Patient: 75 (07/31/18 1400)   Met with patient and her niece during her post surgery follow-up with Dr. Tasia Catchings.  Patient and her niece had many questions.  Dr. Tasia Catchings treatment with Herceptin, radiation therapy - benefits and risks of both treatments.  She offered statistical references for the patient.  I stayed with the patient and reviewed the discussion again and answered more questions.  Patient is scheduled to see Dr. Bary Castilla today and is to see Dr. Baruch Gouty for consultation for radiation therapy.  They are to call me back when the patient has decided whether to take Herceptin, radiation therapy or do nothing and I will schedule her the next appointment with Dr. Tasia Catchings.

## 2018-07-31 NOTE — Progress Notes (Signed)
Patient ID: Kelly Clark, female   DOB: 06-Dec-1929, 84 y.o.   MRN: [id number]  Chief Complaint  Patient presents with  . Routine Post Op    HPI Kelly Clark is a 84 y.o. female here for a post op right breast lumpectomy done on 07/23/18. She was seen by Dr Tasia Catchings today for oncology. She is here today with her Niece, Verdene Lennert.  HPI  Past Medical History:  Diagnosis Date  . Basal cell carcinoma 2019   RIGHT THUMB REGION  . Breast cancer ([place]) 06/27/2018   RIGHT  . Cancer ([place]) 1977   melanoma . SURGERY RIGHT ARM  . Hypertension     Past Surgical History:  Procedure Laterality Date  . ABDOMINAL HYSTERECTOMY  1977  . BREAST BIOPSY Bilateral 1977   benign  . BREAST BIOPSY Right 06/27/2018   INVASIVE MAMMARY CARCINOMA, NO SPECIAL TYPE. 2 oclock ER/PR negative HER2 positive  . BREAST EXCISIONAL BIOPSY Right 07/23/2018   lumpectomy  . BREAST LUMPECTOMY WITH SENTINEL LYMPH NODE BIOPSY Right 07/23/2018   Procedure: BREAST LUMPECTOMY WITH SENTINEL LYMPH NODE BX;  Surgeon: Robert Bellow, MD;  Location: [facility] ORS;  Service: General;  Laterality: Right;  . COLONOSCOPY  2007  . EYE SURGERY Bilateral    CATARACT EXTRACTIONS    Family History  Problem Relation Age of Onset  . Heart attack Mother   . Congestive Heart Failure Mother   . Stroke Father   . Colon cancer Sister 74  . Throat cancer Maternal Grandmother 64  . Healthy Daughter     Social History Social History   Tobacco Use  . Smoking status: Never Smoker  . Smokeless tobacco: Never Used  Substance Use Topics  . Alcohol use: No  . Drug use: No    No Known Allergies  Current Outpatient Medications  Medication Sig Dispense Refill  . amLODipine (NORVASC) 5 MG tablet take 1 tablet by mouth once daily 90 tablet 1  . Cholecalciferol (VITAMIN D3) 10000 units TABS Take 10,000 Units by mouth at bedtime.    . clobetasol ointment (TEMOVATE) 2.87 % Apply 1 application topically every Saturday at 6 PM. Applied to  affected area of vagina and rectum    . gabapentin (NEURONTIN) 600 MG tablet TAKE 1 TABLET BY MOUTH AT BEDTIME 90 tablet 3  . hydrochlorothiazide (HYDRODIURIL) 12.5 MG tablet take 1 tablet by mouth once daily 90 tablet 1  . latanoprost (XALATAN) 0.005 % ophthalmic solution Place 1 drop into both eyes at bedtime.   0  . Multiple Vitamin (MULTIVITAMIN) tablet Take 1 tablet by mouth daily.    . multivitamin-lutein (OCUVITE-LUTEIN) CAPS capsule Take 1 capsule by mouth every morning.     . polyethylene glycol powder (MIRALAX) powder Take 17 g by mouth at bedtime.     Marland Kitchen PROBIOTIC PRODUCT PO Take 1 capsule by mouth daily with lunch.     . RESVERATROL-GRAPE PO Take 1 capsule by mouth daily. CoQ10 Resveratrol & Grape Seed     No current facility-administered medications for this visit.     Review of Systems Review of Systems  Constitutional: Negative.   Respiratory: Negative.   Cardiovascular: Negative.     Blood pressure 138/70, pulse 88, resp. rate 15, height 5' 2" (1.575 m), weight 182 lb (82.6 kg).  Physical Exam Physical Exam  Constitutional: She is oriented to person, place, and time. She appears well-developed and well-nourished.  Pulmonary/Chest:  Well healing right lumpectomy site     Neurological:  She is alert and oriented to person, place, and time.  Skin: Skin is warm and dry.  Psychiatric: She has a normal mood and affect.    Data Reviewed Surgical Pathology  CASE: [id number]  PATIENT: Kelly Clark  Surgical Pathology Report      SPECIMEN SUBMITTED:  A. Breast, right, 3 o'clock; wide excision  B. Sentinel lymph nodes 1, 2, and 3   CLINICAL HISTORY:  None provided   PRE-OPERATIVE DIAGNOSIS:  Right breast cancer   POST-OPERATIVE DIAGNOSIS:  Same as pre op      DIAGNOSIS:  A. BREAST, RIGHT, 3 O'CLOCK; ULTRASOUND-GUIDED WIDE EXCISION:  - INVASIVE MAMMARY CARCINOMA, 6 MM, GRADE 2.  - BIOPSY SITE CHANGES AND MARKER CLIP PRESENT.  - SEE SUMMARY BELOW.    B. SENTINEL LYMPH NODES #1, #2, AND #3, RIGHT AXILLA; EXCISION:  - NEGATIVE FOR MALIGNANCY, THREE LYMPH NODES (0/3).  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   CANCER CASE SUMMARY: INVASIVE CARCINOMA OF THE BREAST  Procedure: Excision  Specimen Laterality: Right  Tumor Size: 6 mm  Histologic Type: Invasive carcinoma of no special type  Histologic Grade (Nottingham Histologic Score)            Glandular (Acinar)/Tubular Differentiation: Score 3            Nuclear Pleomorphism: Score 2            Mitotic Rate: Score 1            Overall Grade: Grade 2  Ductal Carcinoma In Situ (DCIS): Not identified   Margins:            Invasive Carcinoma Margins: Uninvolved by invasive  carcinoma            Distance from closest margin: 5 mm            Specify closest margin: Inferior             DCIS Margins: Not applicable   Regional Lymph Nodes: Uninvolved by tumor cells  Number of Lymph Nodes Examined: 3  Number of Sentinel Nodes Examined: 3   Assessment    Doing well post wide excision of a clinical stage I, HER-2/neu positive carcinoma of the right breast.    Plan    The patient had been presented at the [facility] tumor board the day prior.  At that time, there was not strong recommendation for adjuvant radiation therapy, but she does have an appointment to meet with Dr. Donella Stade in this regard.  The patient is very cool to the idea of Herceptin therapy, but we did discuss that if she was only going to do one thing, Herceptin would probably be be more beneficial than radiation.     Follow up in one month     HPI, Physical Exam, Assessment and Plan have been scribed under the direction and in the presence of Robert Bellow, MD  Concepcion Living, LPN    Forest Gleason Kwadwo Taras 08/01/2018, 5:19 PM

## 2018-07-31 NOTE — Patient Instructions (Addendum)
Follow up one month Use a well supportive bra during the day. May use one at night if needed for comfort.   Wait one more week to restart exercise class.

## 2018-08-01 ENCOUNTER — Encounter: Payer: Self-pay | Admitting: General Surgery

## 2018-08-01 ENCOUNTER — Encounter: Payer: Self-pay | Admitting: *Deleted

## 2018-08-01 NOTE — Progress Notes (Signed)
  Oncology Nurse Navigator Documentation  Navigator Location: [facility] (08/01/18 1300)   )Navigator Encounter Type: Telephone (08/01/18 1300) Telephone: Incoming Call (08/01/18 1300)                                                  Time Spent with Patient: 15 (08/01/18 1300)   Patient called today and said after giving all the treatment options much thought last night, she has decided not to do anything.  States "I am happy with life as it is right now, and I don't want to do anything to mess up how I feel.  If the cancer comes back I the next 5 years, I will deal with it at that time".  She has requested that I cancel her radiation oncology consult, and schedule her next follow-up with Dr. Tasia Catchings.  She was very appreciative for all the information she was given in helping her decision.  She is to call if she has any questions or needs.

## 2018-08-07 ENCOUNTER — Institutional Professional Consult (permissible substitution): Payer: PPO | Admitting: Radiation Oncology

## 2018-08-09 DIAGNOSIS — C44622 Squamous cell carcinoma of skin of right upper limb, including shoulder: Secondary | ICD-10-CM | POA: Diagnosis not present

## 2018-08-09 DIAGNOSIS — C44629 Squamous cell carcinoma of skin of left upper limb, including shoulder: Secondary | ICD-10-CM | POA: Diagnosis not present

## 2018-08-17 ENCOUNTER — Other Ambulatory Visit: Payer: Self-pay | Admitting: Family Medicine

## 2018-08-17 NOTE — Telephone Encounter (Signed)
Pharmacy requesting refills.

## 2018-08-28 ENCOUNTER — Ambulatory Visit (INDEPENDENT_AMBULATORY_CARE_PROVIDER_SITE_OTHER): Payer: PPO | Admitting: General Surgery

## 2018-08-28 ENCOUNTER — Encounter: Payer: Self-pay | Admitting: General Surgery

## 2018-08-28 VITALS — BP 138/78 | HR 101 | Resp 12 | Ht 62.0 in | Wt 183.0 lb

## 2018-08-28 DIAGNOSIS — Z171 Estrogen receptor negative status [ER-]: Secondary | ICD-10-CM | POA: Diagnosis not present

## 2018-08-28 DIAGNOSIS — C50211 Malignant neoplasm of upper-inner quadrant of right female breast: Secondary | ICD-10-CM

## 2018-08-28 NOTE — Progress Notes (Signed)
Patient ID: Kelly Clark, female   DOB: Mar 26, 1929, 84 y.o.   MRN: [id number]  Chief Complaint  Patient presents with  . Follow-up    HPI Kelly Clark is a 84 y.o. female here today for her follow up breast cancer.Right lumpectomy done on 07/23/2018.  Patient states she is doing well. No new breast problems. Sore under right armpit. Patient states she is not going to do Chemo.  Daughter is present at visit.  HPI  Past Medical History:  Diagnosis Date  . Basal cell carcinoma 2019   RIGHT THUMB REGION  . Breast cancer ([place]) 06/27/2018   RIGHT  . Cancer ([place]) 1977   melanoma . SURGERY RIGHT ARM  . Hypertension     Past Surgical History:  Procedure Laterality Date  . ABDOMINAL HYSTERECTOMY  1977  . BREAST BIOPSY Bilateral 1977   benign  . BREAST BIOPSY Right 06/27/2018   INVASIVE MAMMARY CARCINOMA, NO SPECIAL TYPE. 2 oclock ER/PR negative HER2 positive  . BREAST EXCISIONAL BIOPSY Right 07/23/2018   lumpectomy  . BREAST LUMPECTOMY WITH SENTINEL LYMPH NODE BIOPSY Right 07/23/2018   6 mm ER/PR negative, Her 2 neu 3+; node negative.  Surgeon: Robert Bellow, MD;  Location: [facility] ORS;  Service: General;  Laterality: Right;  . COLONOSCOPY  2007  . EYE SURGERY Bilateral    CATARACT EXTRACTIONS    Family History  Problem Relation Age of Onset  . Heart attack Mother   . Congestive Heart Failure Mother   . Stroke Father   . Colon cancer Sister 52  . Throat cancer Maternal Grandmother 41  . Healthy Daughter     Social History Social History   Tobacco Use  . Smoking status: Never Smoker  . Smokeless tobacco: Never Used  Substance Use Topics  . Alcohol use: No  . Drug use: No    No Known Allergies  Current Outpatient Medications  Medication Sig Dispense Refill  . amLODipine (NORVASC) 5 MG tablet TAKE 1 TABLET BY MOUTH ONCE DAILY 90 tablet 0  . Cholecalciferol (VITAMIN D3) 10000 units TABS Take 10,000 Units by mouth at bedtime.    . clobetasol ointment (TEMOVATE) 4.49 %  Apply 1 application topically every Saturday at 6 PM. Applied to affected area of vagina and rectum    . gabapentin (NEURONTIN) 600 MG tablet TAKE 1 TABLET BY MOUTH AT BEDTIME 90 tablet 3  . hydrochlorothiazide (HYDRODIURIL) 12.5 MG tablet TAKE 1 TABLET BY MOUTH ONCE DAILY 90 tablet 3  . latanoprost (XALATAN) 0.005 % ophthalmic solution Place 1 drop into both eyes at bedtime.   0  . Multiple Vitamin (MULTIVITAMIN) tablet Take 1 tablet by mouth daily.    . multivitamin-lutein (OCUVITE-LUTEIN) CAPS capsule Take 1 capsule by mouth every morning.     . polyethylene glycol powder (MIRALAX) powder Take 17 g by mouth at bedtime.     Marland Kitchen PROBIOTIC PRODUCT PO Take 1 capsule by mouth daily with lunch.     . RESVERATROL-GRAPE PO Take 1 capsule by mouth daily. CoQ10 Resveratrol & Grape Seed     No current facility-administered medications for this visit.     Review of Systems Review of Systems  Constitutional: Negative.   Respiratory: Negative.   Cardiovascular: Negative.     Blood pressure 138/78, pulse (!) 101, resp. rate 12, height '5\' 2"'$  (1.575 m), weight 183 lb (83 kg), SpO2 96 %.  Physical Exam Physical Exam  Constitutional: She is oriented to person, place, and time. She appears  well-developed and well-nourished.  Eyes: Conjunctivae are normal. No scleral icterus.  Neck: Normal range of motion.  Cardiovascular: Normal rate, regular rhythm and normal heart sounds.  Pulmonary/Chest: Effort normal and breath sounds normal. Right breast exhibits no inverted nipple, no mass, no nipple discharge, no skin change and no tenderness. Left breast exhibits no inverted nipple, no mass, no nipple discharge, no skin change and no tenderness. Breasts are symmetrical.    Lymphadenopathy:    She has no cervical adenopathy.  Neurological: She is alert and oriented to person, place, and time.  Skin: Skin is warm and dry.       Assessment    Doing well post breast wide excision.    Plan  The patient  had elected not to proceed with adjuvant chemotherapy as she is already experiencing some neuropathy and she was fearful that this would worsen.  She had a 6 mm tumor,, triple negative which made her eligible for adjuvant treatment.  Review at the [facility] tumor board suggested modest of any benefit from breast radiation.  The patient was ambivalent about whether she would want to meet with the radiation oncologist.    Family history of colon cancer.  Patient not particularly interested in colonoscopy.  Amenable to Cologuard and colonoscopy if "positive".   Return in four months.    The patient is aware to call back for any questions or concerns.   HPI, Physical Exam, Assessment and Plan have been scribed under the direction and in the presence of Hervey Ard, MD.  Gaspar Cola, CMA  I have completed the exam and reviewed the above documentation for accuracy and completeness.  I agree with the above.  Haematologist has been used and any errors in dictation or transcription are unintentional.  Hervey Ard, M.D., F.A.C.S.  Forest Gleason Ester Hilley 08/30/2018, 7:58 AM

## 2018-08-28 NOTE — Patient Instructions (Signed)
sent Color gaurd to patient.

## 2018-09-19 ENCOUNTER — Ambulatory Visit (INDEPENDENT_AMBULATORY_CARE_PROVIDER_SITE_OTHER): Payer: PPO | Admitting: Family Medicine

## 2018-09-19 ENCOUNTER — Ambulatory Visit (INDEPENDENT_AMBULATORY_CARE_PROVIDER_SITE_OTHER): Payer: PPO

## 2018-09-19 VITALS — BP 142/68 | HR 82 | Temp 97.9°F | Resp 16 | Wt 183.4 lb

## 2018-09-19 VITALS — BP 142/68 | HR 82 | Temp 97.9°F | Ht 62.0 in | Wt 183.4 lb

## 2018-09-19 DIAGNOSIS — C50211 Malignant neoplasm of upper-inner quadrant of right female breast: Secondary | ICD-10-CM | POA: Diagnosis not present

## 2018-09-19 DIAGNOSIS — Z0000 Encounter for general adult medical examination without abnormal findings: Secondary | ICD-10-CM

## 2018-09-19 DIAGNOSIS — Z23 Encounter for immunization: Secondary | ICD-10-CM

## 2018-09-19 DIAGNOSIS — Z171 Estrogen receptor negative status [ER-]: Secondary | ICD-10-CM | POA: Diagnosis not present

## 2018-09-19 DIAGNOSIS — L570 Actinic keratosis: Secondary | ICD-10-CM | POA: Diagnosis not present

## 2018-09-19 DIAGNOSIS — G8929 Other chronic pain: Secondary | ICD-10-CM | POA: Diagnosis not present

## 2018-09-19 DIAGNOSIS — M25511 Pain in right shoulder: Secondary | ICD-10-CM | POA: Diagnosis not present

## 2018-09-19 DIAGNOSIS — R42 Dizziness and giddiness: Secondary | ICD-10-CM | POA: Diagnosis not present

## 2018-09-19 NOTE — Progress Notes (Signed)
Subjective:   Kelly Clark is a 84 y.o. female who presents for Medicare Annual (Subsequent) preventive examination.  Review of Systems:  N/A  Cardiac Risk Factors include: advanced age (>73mn, >>4women);hypertension;obesity (BMI >30kg/m2)     Objective:     Vitals: BP (!) 142/68 (BP Location: Right Arm)   Pulse 82   Temp 97.9 F (36.6 C) (Oral)   Ht '5\' 2"'$  (1.575 m)   Wt 183 lb 6.4 oz (83.2 kg)   BMI 33.54 kg/m   Body mass index is 33.54 kg/m.  Advanced Directives 09/19/2018 07/23/2018 07/17/2018 07/09/2018 07/06/2017 10/05/2016 09/23/2015  Does Patient Have a Medical Advance Directive? Yes Yes Yes Yes Yes Yes Yes  Type of AParamedicof [place] will [place]Living will [place] JeffersonLiving will [place]Living will [place] EstatesLiving will - [place]Living will  Does patient want to make changes to medical advance directive? - No - Patient declined No - Patient declined - - - -  Copy of [place] Chart? No - copy requested Yes Yes - Yes - -    Tobacco Social History   Tobacco Use  Smoking Status Never Smoker  Smokeless Tobacco Never Used     Counseling given: Not Answered   Clinical Intake:  Pre-visit preparation completed: Yes  Pain : No/denies pain Pain Score: 0-No pain(Has some right shoulder pain occasionally. )     Nutritional Status: BMI > 30  Obese Nutritional Risks: None Diabetes: No  How often do you need to have someone help you when you read instructions, pamphlets, or other written materials from your doctor or pharmacy?: 1 - Never  Interpreter Needed?: No  Information [facility] :: [facility] L[facility]  Past Medical History:  Diagnosis Date  . Basal cell carcinoma 2019   RIGHT THUMB REGION  . [place]   [place]  . [place]   melanoma . SURGERY RIGHT ARM  . Hypertension      Past Surgical History:  Procedure Laterality Date  . ABDOMINAL HYSTERECTOMY  1977  . BREAST BIOPSY Bilateral 1977   benign  . BREAST BIOPSY Right 06/27/2018   INVASIVE MAMMARY CARCINOMA, NO SPECIAL TYPE. 2 oclock ER/PR negative HER2 positive  . BREAST EXCISIONAL BIOPSY Right 07/23/2018   lumpectomy  . BREAST LUMPECTOMY WITH SENTINEL LYMPH NODE BIOPSY Right 07/23/2018   6 mm ER/PR negative, Her 2 neu 3+; node negative.  Surgeon: BRobert Bellow MD;  Location: [facility] ORS;  Service: General;  Laterality: Right;  . COLONOSCOPY  2007  . EYE SURGERY Bilateral    CATARACT EXTRACTIONS   Family History  Problem Relation Age of Onset  . Heart attack Mother   . Congestive Heart Failure Mother   . Stroke Father   . Colon cancer Sister 815 . Throat cancer Maternal Grandmother [phone] . Healthy Daughter    Social History   Socioeconomic History  . Marital status: Widowed    Spouse name: Not on file  . Number of children: 1  . Years of education: H/S  . Highest education level: Some college, no degree  Occupational History  . Occupation: Retired  SScientific laboratory technician . Financial resource strain: Not hard at all  . Food insecurity:    Worry: Never true    Inability: Never true  . Transportation needs:    Medical: No    Non-medical: No  Tobacco Use  .  Smoking status: Never Smoker  . Smokeless tobacco: Never Used  Substance and Sexual Activity  . Alcohol use: No  . Drug use: No  . Sexual activity: Not Currently  Lifestyle  . Physical activity:    Days per week: Not on file    Minutes per session: Not on file  . Stress: Only a little  Relationships  . Social connections:    Talks on phone: Not on file    Gets together: Not on file    Attends religious service: Not on file    Active member of club or organization: Not on file    Attends meetings of clubs or organizations: Not on file    Relationship status: Not on file  Other Topics Concern  . Not on file  Social History  Narrative  . Not on file    Outpatient Encounter Medications as of 09/19/2018  Medication Sig  . amLODipine (NORVASC) 5 MG tablet TAKE 1 TABLET BY MOUTH ONCE DAILY  . Cholecalciferol (VITAMIN D3) 10000 units TABS Take 10,000 Units by mouth at bedtime.  . clobetasol ointment (TEMOVATE) 0.93 % Apply 1 application topically once a week. Applied to affected area of vagina and rectum   . gabapentin (NEURONTIN) 600 MG tablet TAKE 1 TABLET BY MOUTH AT BEDTIME  . hydrochlorothiazide (HYDRODIURIL) 12.5 MG tablet TAKE 1 TABLET BY MOUTH ONCE DAILY  . latanoprost (XALATAN) 0.005 % ophthalmic solution Place 1 drop into both eyes at bedtime.   . Multiple Vitamin (MULTIVITAMIN) tablet Take 1 tablet by mouth daily.  . multivitamin-lutein (OCUVITE-LUTEIN) CAPS capsule Take 1 capsule by mouth every morning.   . polyethylene glycol powder (MIRALAX) powder Take 17 g by mouth at bedtime.   Marland Kitchen PROBIOTIC PRODUCT PO Take 1 capsule by mouth daily with lunch.   . RESVERATROL-GRAPE PO Take 1 capsule by mouth daily. CoQ10 Resveratrol & Grape Seed   No facility-administered encounter medications on file as of 09/19/2018.     Activities of Daily Living In your present state of health, do you have any difficulty performing the following activities: 09/19/2018 07/23/2018  Hearing? N N  Vision? N N  Difficulty concentrating or making decisions? N N  Walking or climbing stairs? N N  Dressing or bathing? N N  Doing errands, shopping? N -  Preparing Food and eating ? N -  Using the Toilet? N -  In the past six months, have you accidently leaked urine? N -  Do you have problems with loss of bowel control? N -  Managing your Medications? N -  Managing your Finances? N -  Housekeeping or managing your Housekeeping? N -  Some recent data might be hidden    Patient Care Team: [name]., MD as PCP - General (Family Medicine) Lorelee Cover., MD as Consulting Physician (Ophthalmology) Oneta Rack, MD as  Consulting Physician (Dermatology) Bary Castilla, Forest Gleason, MD (General Surgery) Earlie Server, MD as Consulting Physician (Oncology)    Assessment:   This is a routine wellness examination for [place].  Exercise Activities and Dietary recommendations Current Exercise Habits: Structured exercise class, Type of exercise: stretching;strength training/weights, Time (Minutes): 60, Frequency (Times/Week): 3, Weekly Exercise (Minutes/Week): 180, Intensity: Mild, Exercise limited by: None identified  Goals    . Exercise 150 minutes per week (moderate activity)    . Increase water intake     Recommend increasing water intake to 4-6 glasses a day.        Fall Risk Fall Risk  09/19/2018 07/06/2017 10/05/2016 09/23/2015  Falls in the past year? No No Yes No  Number falls in past yr: - - 1 -  Injury with Fall? - - Yes -   Is the patient's home free of loose throw rugs in walkways, pet beds, electrical cords, etc?   yes      Grab bars in the bathroom? yes      Handrails on the stairs? yes      Adequate lighting?   yes  Timed Get Up and Go performed: N/A  Depression Screen PHQ 2/9 Scores 09/19/2018 09/19/2018 07/06/2017 10/05/2016  PHQ - 2 Score 0 0 0 0  PHQ- 9 Score 0 - - -     Cognitive Function: Pt declined screening today.      6CIT Screen 07/06/2017 10/05/2016  What Year? 0 points 0 points  What month? 0 points 0 points  What time? 0 points 0 points  Count back from 20 0 points 0 points  Months in reverse 0 points 0 points  Repeat phrase 0 points 0 points  Total Score 0 0    Immunization History  Administered Date(s) Administered  . Influenza, High Dose Seasonal PF 09/23/2015, 10/05/2016, 11/01/2017, 09/19/2018  . Pneumococcal Conjugate-13 08/01/2014  . Pneumococcal Polysaccharide-23 06/03/1999  . Td 01/18/2007    Qualifies for Shingles Vaccine? Due for Shingles vaccine. Declined my offer to administer today. Education has been provided regarding the importance of this vaccine. Pt has  been advised to call her insurance company to determine her out of pocket expense. Advised she may also receive this vaccine at her local pharmacy or Health Dept. Verbalized acceptance and understanding.  Screening Tests Health Maintenance  Topic Date Due  . TETANUS/TDAP  12/19/2026 (Originally 01/18/2017)  . INFLUENZA VACCINE  Completed  . DEXA SCAN  Completed  . PNA vac Low Risk Adult  Completed    Cancer Screenings: Lung: Low Dose CT Chest recommended if Age 64-80 years, 30 pack-year currently smoking OR have quit w/in 15years. Patient does not qualify. Breast:  Up to date on Mammogram? Yes   Up to date of Bone Density/Dexa? Yes Colorectal: Up to date  Additional Screenings:  Hepatitis C Screening: N/A     Plan:  I have personally reviewed and addressed the Medicare Annual Wellness questionnaire and have noted the following in the patient's chart:  A. Medical and social history B. Use of alcohol, tobacco or illicit drugs  C. Current medications and supplements D. Functional ability and status E.  Nutritional status F.  Physical activity G. Advance directives H. List of other physicians I.  Hospitalizations, surgeries, and ER visits in previous 12 months J.  [place] such as hearing and vision if needed, cognitive and depression L. Referrals and appointments - none  In addition, I have reviewed and discussed with patient certain preventive protocols, quality metrics, and best practice recommendations. A written personalized care plan for preventive services as well as general preventive health recommendations were provided to patient.  See attached scanned questionnaire for additional information.   Signed,  Fabio Neighbors, LPN Nurse Health Advisor   Nurse Recommendations: Pt declined the tetanus vaccine today.

## 2018-09-19 NOTE — Progress Notes (Signed)
Patient: Kelly Clark, Female    DOB: 1929-10-18, 84 y.o.   MRN: [id number] Visit Date: 09/19/2018  Today's Provider: Wilhemena Durie, MD   Chief Complaint  Patient presents with  . Annual Wellness Exam  . Dizziness   Subjective:    Annual wellness visit Kelly Clark is a 84 y.o. female. She feels fairly well. She reports exercising 3 days a week at the [facility]. She reports she is sleeping well.  Colonoscopy- 05/08/2006. Normal. Mammogram-  12/05/2017. Additional imaging done on 12/20/2017. Patient is to be followed every 3 months. Lumpectomy was done on 07/23/2018.  BMD- 10/06/2010. Normal. Td- 01/18/2007.   Patient also mentions that she has felt lightheaded for the last 3 days. She reports that this comes and goes. Denies falls. Denies chest pains, shortness of breath, or blurred vision. Reports lying down to rest makes symptoms better.   Review of Systems  Constitutional: Negative.   HENT: Negative.   Eyes: Negative.   Respiratory: Negative.   Cardiovascular: Negative.   Gastrointestinal: Negative.   Endocrine: Negative.   Genitourinary: Negative.   Musculoskeletal: Positive for arthralgias and myalgias.  Skin: Negative.   Allergic/Immunologic: Negative.   Neurological: Positive for light-headedness.  Hematological: Negative.   Psychiatric/Behavioral: Negative.     Social History   Socioeconomic History  . Marital status: Widowed    Spouse name: Not on file  . Number of children: 1  . Years of education: H/S  . Highest education level: Some college, no degree  Occupational History  . Occupation: Retired  Scientific laboratory technician  . Financial resource strain: Not hard at all  . Food insecurity:    Worry: Never true    Inability: Never true  . Transportation needs:    Medical: No    Non-medical: No  Tobacco Use  . Smoking status: Never Smoker  . Smokeless tobacco: Never Used  Substance and Sexual Activity  . Alcohol use: No  . Drug use: No  . Sexual  activity: Not Currently  Lifestyle  . Physical activity:    Days per week: Not on file    Minutes per session: Not on file  . Stress: Only a little  Relationships  . Social connections:    Talks on phone: Not on file    Gets together: Not on file    Attends religious service: Not on file    Active member of club or organization: Not on file    Attends meetings of clubs or organizations: Not on file    Relationship status: Not on file  . Intimate partner violence:    Fear of current or ex partner: Not on file    Emotionally abused: Not on file    Physically abused: Not on file    Forced sexual activity: Not on file  Other Topics Concern  . Not on file  Social History Narrative  . Not on file    Past Medical History:  Diagnosis Date  . Basal cell carcinoma 2019   RIGHT THUMB REGION  . Breast cancer ([place]) 06/27/2018   RIGHT  . Cancer ([place]) 1977   melanoma . SURGERY RIGHT ARM  . Hypertension      Patient Active Problem List   Diagnosis Date Noted  . Malignant neoplasm of upper-inner quadrant of right breast in female, estrogen receptor negative ([place]) 07/11/2018  . Benign essential HTN 09/08/2017  . Adult BMI 30+ 07/06/2015  . Hemorrhoid 07/06/2015  . Neutropenia ([place]) 07/06/2015  .  Disorder of peripheral nervous system 07/06/2015  . Avitaminosis D 07/06/2015  . Vaginal atrophy 07/06/2015  . Abnormal LFTs 08/21/2009  . Cannot sleep 05/23/2000  . Hypertriglyceridemia 05/19/2000    Past Surgical History:  Procedure Laterality Date  . ABDOMINAL HYSTERECTOMY  1977  . BREAST BIOPSY Bilateral 1977   benign  . BREAST BIOPSY Right 06/27/2018   INVASIVE MAMMARY CARCINOMA, NO SPECIAL TYPE. 2 oclock ER/PR negative HER2 positive  . BREAST EXCISIONAL BIOPSY Right 07/23/2018   lumpectomy  . BREAST LUMPECTOMY WITH SENTINEL LYMPH NODE BIOPSY Right 07/23/2018   6 mm ER/PR negative, Her 2 neu 3+; node negative.  Surgeon: Robert Bellow, MD;  Location: [facility] ORS;  Service:  General;  Laterality: Right;  . COLONOSCOPY  2007  . EYE SURGERY Bilateral    CATARACT EXTRACTIONS    Her family history includes Colon cancer (age of onset: 7) in her sister; Congestive Heart Failure in her mother; Healthy in her daughter; Heart attack in her mother; Stroke in her father; Throat cancer (age of onset: 20) in her maternal grandmother.      Current Outpatient Medications:  .  amLODipine (NORVASC) 5 MG tablet, TAKE 1 TABLET BY MOUTH ONCE DAILY, Disp: 90 tablet, Rfl: 0 .  Cholecalciferol (VITAMIN D3) 10000 units TABS, Take 10,000 Units by mouth at bedtime., Disp: , Rfl:  .  clobetasol ointment (TEMOVATE) 2.24 %, Apply 1 application topically once a week. Applied to affected area of vagina and rectum , Disp: , Rfl:  .  gabapentin (NEURONTIN) 600 MG tablet, TAKE 1 TABLET BY MOUTH AT BEDTIME, Disp: 90 tablet, Rfl: 3 .  hydrochlorothiazide (HYDRODIURIL) 12.5 MG tablet, TAKE 1 TABLET BY MOUTH ONCE DAILY, Disp: 90 tablet, Rfl: 3 .  latanoprost (XALATAN) 0.005 % ophthalmic solution, Place 1 drop into both eyes at bedtime. , Disp: , Rfl: 0 .  Multiple Vitamin (MULTIVITAMIN) tablet, Take 1 tablet by mouth daily., Disp: , Rfl:  .  multivitamin-lutein (OCUVITE-LUTEIN) CAPS capsule, Take 1 capsule by mouth every morning. , Disp: , Rfl:  .  polyethylene glycol powder (MIRALAX) powder, Take 17 g by mouth at bedtime. , Disp: , Rfl:  .  PROBIOTIC PRODUCT PO, Take 1 capsule by mouth daily with lunch. , Disp: , Rfl:  .  RESVERATROL-GRAPE PO, Take 1 capsule by mouth daily. CoQ10 Resveratrol & Grape Seed, Disp: , Rfl:   Patient Care Team: [name]., MD as PCP - General (Family Medicine) Lorelee Cover., MD as Consulting Physician (Ophthalmology) Oneta Rack, MD as Consulting Physician (Dermatology) Bary Castilla, Forest Gleason, MD (General Surgery) Earlie Server, MD as Consulting Physician (Oncology)     Objective:   Vitals: BP (!) 142/68   Pulse 82   Temp 97.9 F (36.6 C)   Resp  16   Wt 183 lb 6.4 oz (83.2 kg)   SpO2 96%   BMI 33.54 kg/m   Physical Exam  Constitutional: She is oriented to person, place, and time. She appears well-developed and well-nourished.  HENT:  Head: Normocephalic and atraumatic.  Right Ear: External ear normal.  Left Ear: External ear normal.  Nose: Nose normal.  Mouth/Throat: Oropharynx is clear and moist.  Eyes: Conjunctivae are normal. No scleral icterus.  Neck: No thyromegaly present.  Cardiovascular: Normal rate, regular rhythm, normal heart sounds and intact distal pulses.  Pulmonary/Chest: Effort normal and breath sounds normal.  Abdominal: Soft.  Musculoskeletal: She exhibits tenderness. She exhibits no edema.  Mildly tender over right anterior shoulder.  Neurological: She is alert and oriented to person, place, and time.  Skin: Skin is warm and dry.  Psychiatric: She has a normal mood and affect. Her behavior is normal. Judgment and thought content normal.    Activities of Daily Living In your present state of health, do you have any difficulty performing the following activities: 09/19/2018 07/23/2018  Hearing? N N  Vision? N N  Difficulty concentrating or making decisions? N N  Walking or climbing stairs? N N  Dressing or bathing? N N  Doing errands, shopping? N -  Preparing Food and eating ? N -  Using the Toilet? N -  In the past six months, have you accidently leaked urine? N -  Do you have problems with loss of bowel control? N -  Managing your Medications? N -  Managing your Finances? N -  Housekeeping or managing your Housekeeping? N -  Some recent data might be hidden    Fall Risk Assessment Fall Risk  09/19/2018 07/06/2017 10/05/2016 09/23/2015  Falls in the past year? No No Yes No  Number falls in past yr: - - 1 -  Injury with Fall? - - Yes -     Depression Screen PHQ 2/9 Scores 09/19/2018 09/19/2018 07/06/2017 10/05/2016  PHQ - 2 Score 0 0 0 0  PHQ- 9 Score 0 - - -    Cognitive Testing - 6-CIT 6CIT  Screen 07/06/2017 10/05/2016  What Year? 0 points 0 points  What month? 0 points 0 points  What time? 0 points 0 points  Count back from 20 0 points 0 points  Months in reverse 0 points 0 points  Repeat phrase 0 points 0 points  Total Score 0 0        Assessment & Plan:     Annual Wellness Visit  Reviewed patient's Family Medical History Reviewed and updated list of patient's medical providers Assessment of cognitive impairment was done Assessed patient's functional ability Established a written schedule for health screening [place] Completed and Reviewed  Exercise Activities and Dietary recommendations Goals    . Exercise 150 minutes per week (moderate activity)    . Increase water intake     Recommend increasing water intake to 4-6 glasses a day.        Immunization History  Administered Date(s) Administered  . Influenza, High Dose Seasonal PF 09/23/2015, 10/05/2016, 11/01/2017, 09/19/2018  . Pneumococcal Conjugate-13 08/01/2014  . Pneumococcal Polysaccharide-23 06/03/1999  . Td 01/18/2007    Health Maintenance  Topic Date Due  . TETANUS/TDAP  12/19/2026 (Originally 01/18/2017)  . INFLUENZA VACCINE  Completed  . DEXA SCAN  Completed  . PNA vac Low Risk Adult  Completed     Discussed health benefits of physical activity, and encouraged her to engage in regular exercise appropriate for her age and condition.  1. Encounter for annual physical exam   2. Lightheadedness  - EKG 12-Lead - Comprehensive metabolic panel - CBC with Differential/Platelet - Renal function panel  3. Chronic right shoulder pain   4. Malignant neoplasm of upper-inner quadrant of right breast in female, estrogen receptor negative ([place]) 5.Actinic Keratosis Per Dr Darrick Huntsman.  I have done the exam and reviewed the above chart and it is accurate to the best of my knowledge. Development worker, community has been used in this note in any air is in the dictation or  transcription are unintentional.  Wilhemena Durie, MD  [place]

## 2018-09-19 NOTE — Patient Instructions (Signed)
Kelly Clark , Thank you for taking time to come for your Medicare Wellness Visit. I appreciate your ongoing commitment to your health goals. Please review the following plan we discussed and let me know if I can assist you in the future.   Screening recommendations/referrals: Colonoscopy: N/A Mammogram: Up to date Bone Density: Up to date Recommended yearly ophthalmology/optometry visit for glaucoma screening and checkup Recommended yearly dental visit for hygiene and checkup  Vaccinations: Influenza vaccine: Up to date Pneumococcal vaccine: Up to date Tdap vaccine: Pt declines today.  Shingles vaccine: Pt declines today.     Advanced directives: Please bring a copy of your POA (Power of Attorney) and/or Living Will to your next appointment.   Conditions/risks identified: Recommend to continue trying to increase your water intake to 4-6 glasses a day.   Next appointment: 10:40 AM today with Dr Rosanna Randy.    Preventive Care 50 Years and Older, Female Preventive care refers to lifestyle choices and visits with your health care provider that can promote health and wellness. What does preventive care include?  A yearly physical exam. This is also called an annual well check.  Dental exams once or twice a year.  Routine eye exams. Ask your health care provider how often you should have your eyes checked.  Personal lifestyle choices, including:  Daily care of your teeth and gums.  Regular physical activity.  Eating a healthy diet.  Avoiding tobacco and drug use.  Limiting alcohol use.  Practicing safe sex.  Taking low-dose aspirin every day.  Taking vitamin and mineral supplements as recommended by your health care provider. What happens during an annual well check? The services and screenings done by your health care provider during your annual well check will depend on your age, overall health, lifestyle risk factors, and family history of disease. Counseling  Your health  care provider may ask you questions about your:  Alcohol use.  Tobacco use.  Drug use.  Emotional well-being.  Home and relationship well-being.  Sexual activity.  Eating habits.  History of falls.  Memory and ability to understand (cognition).  Work and work Statistician.  Reproductive health. Screening  You may have the following tests or measurements:  Height, weight, and BMI.  Blood pressure.  Lipid and cholesterol levels. These may be checked every 5 years, or more frequently if you are over 82 years old.  Skin check.  Lung cancer screening. You may have this screening every year starting at age 54 if you have a 30-pack-year history of smoking and currently smoke or have quit within the past 15 years.  Fecal occult blood test (FOBT) of the stool. You may have this test every year starting at age 44.  Flexible sigmoidoscopy or colonoscopy. You may have a sigmoidoscopy every 5 years or a colonoscopy every 10 years starting at age 87.  Hepatitis C blood test.  Hepatitis B blood test.  Sexually transmitted disease (STD) testing.  Diabetes screening. This is done by checking your blood sugar (glucose) after you have not eaten for a while (fasting). You may have this done every 1-3 years.  Bone density scan. This is done to screen for osteoporosis. You may have this done starting at age 23.  Mammogram. This may be done every 1-2 years. Talk to your health care provider about how often you should have regular mammograms. Talk with your health care provider about your test results, treatment options, and if necessary, the need for more tests. Vaccines  Your health  care provider may recommend certain vaccines, such as:  Influenza vaccine. This is recommended every year.  Tetanus, diphtheria, and acellular pertussis (Tdap, Td) vaccine. You may need a Td booster every 10 years.  Zoster vaccine. You may need this after age 35.  Pneumococcal 13-valent conjugate  (PCV13) vaccine. One dose is recommended after age 25.  Pneumococcal polysaccharide (PPSV23) vaccine. One dose is recommended after age 42. Talk to your health care provider about which screenings and vaccines you need and how often you need them. This information is not intended to replace advice given to you by your health care provider. Make sure you discuss any questions you have with your health care provider. Document Released: 01/01/2016 Document Revised: 08/24/2016 Document Reviewed: 10/06/2015 Elsevier Interactive Patient Education  2017 [place] Prevention in the Home Falls can cause injuries. They can happen to people of all ages. There are many things you can do to make your home safe and to help prevent falls. What can I do on the outside of my home?  Regularly fix the edges of walkways and driveways and fix any cracks.  Remove anything that might make you trip as you walk through a door, such as a raised step or threshold.  Trim any bushes or trees on the path to your home.  Use bright outdoor lighting.  Clear any walking paths of anything that might make someone trip, such as rocks or tools.  Regularly check to see if handrails are loose or broken. Make sure that both sides of any steps have handrails.  Any raised decks and porches should have guardrails on the edges.  Have any leaves, snow, or ice cleared regularly.  Use sand or salt on walking paths during winter.  Clean up any spills in your garage right away. This includes oil or grease spills. What can I do in the bathroom?  Use night lights.  Install grab bars by the toilet and in the tub and shower. Do not use towel bars as grab bars.  Use non-skid mats or decals in the tub or shower.  If you need to sit down in the shower, use a plastic, non-slip stool.  Keep the floor dry. Clean up any water that spills on the floor as soon as it happens.  Remove soap buildup in the tub or shower  regularly.  Attach bath mats securely with double-sided non-slip rug tape.  Do not have throw rugs and other things on the floor that can make you trip. What can I do in the bedroom?  Use night lights.  Make sure that you have a light by your bed that is easy to reach.  Do not use any sheets or blankets that are too big for your bed. They should not hang down onto the floor.  Have a firm chair that has side arms. You can use this for support while you get dressed.  Do not have throw rugs and other things on the floor that can make you trip. What can I do in the kitchen?  Clean up any spills right away.  Avoid walking on wet floors.  Keep items that you use a lot in easy-to-reach places.  If you need to reach something above you, use a strong step stool that has a grab bar.  Keep electrical cords out of the way.  Do not use floor polish or wax that makes floors slippery. If you must use wax, use non-skid floor wax.  Do not have  throw rugs and other things on the floor that can make you trip. What can I do with my stairs?  Do not leave any items on the stairs.  Make sure that there are handrails on both sides of the stairs and use them. Fix handrails that are broken or loose. Make sure that handrails are as long as the stairways.  Check any carpeting to make sure that it is firmly attached to the stairs. Fix any carpet that is loose or worn.  Avoid having throw rugs at the top or bottom of the stairs. If you do have throw rugs, attach them to the floor with carpet tape.  Make sure that you have a light switch at the top of the stairs and the bottom of the stairs. If you do not have them, ask someone to add them for you. What else can I do to help prevent falls?  Wear shoes that:  Do not have high heels.  Have rubber bottoms.  Are comfortable and fit you well.  Are closed at the toe. Do not wear sandals.  If you use a stepladder:  Make sure that it is fully  opened. Do not climb a closed stepladder.  Make sure that both sides of the stepladder are locked into place.  Ask someone to hold it for you, if possible.  Clearly mark and make sure that you can see:  Any grab bars or handrails.  First and last steps.  Where the edge of each step is.  Use tools that help you move around (mobility aids) if they are needed. These include:  Canes.  Walkers.  Scooters.  Crutches.  Turn on the lights when you go into a dark area. Replace any light bulbs as soon as they burn out.  Set up your furniture so you have a clear path. Avoid moving your furniture around.  If any of your floors are uneven, fix them.  If there are any pets around you, be aware of where they are.  Review your medicines with your doctor. Some medicines can make you feel dizzy. This can increase your chance of falling. Ask your doctor what other things that you can do to help prevent falls. This information is not intended to replace advice given to you by your health care provider. Make sure you discuss any questions you have with your health care provider. Document Released: 10/01/2009 Document Revised: 05/12/2016 Document Reviewed: 01/09/2015 Elsevier Interactive Patient Education  2017 [employer].

## 2018-09-20 ENCOUNTER — Telehealth: Payer: Self-pay | Admitting: *Deleted

## 2018-09-20 LAB — CBC WITH DIFFERENTIAL/PLATELET
Basophils Absolute: 0 10*3/uL (ref 0.0–0.2)
Basos: 1 %
EOS (ABSOLUTE): 0.2 10*3/uL (ref 0.0–0.4)
Eos: 3 %
Hematocrit: 42.4 % (ref 34.0–46.6)
Hemoglobin: 14.1 g/dL (ref 11.1–15.9)
Immature Grans (Abs): 0 10*3/uL (ref 0.0–0.1)
Immature Granulocytes: 0 %
Lymphocytes Absolute: 1.4 10*3/uL (ref 0.7–3.1)
Lymphs: 23 %
MCH: 31.2 pg (ref 26.6–33.0)
MCHC: 33.3 g/dL (ref 31.5–35.7)
MCV: 94 fL (ref 79–97)
Monocytes Absolute: 0.6 10*3/uL (ref 0.1–0.9)
Monocytes: 9 %
Neutrophils Absolute: 4.1 10*3/uL (ref 1.4–7.0)
Neutrophils: 64 %
Platelets: 199 10*3/uL (ref 150–450)
RBC: 4.52 x10E6/uL (ref 3.77–5.28)
RDW: 13.9 % (ref 12.3–15.4)
WBC: 6.3 10*3/uL (ref 3.4–10.8)

## 2018-09-20 LAB — COMPREHENSIVE METABOLIC PANEL
ALT: 17 IU/L (ref 0–32)
AST: 21 IU/L (ref 0–40)
Albumin/Globulin Ratio: 1.7 (ref 1.2–2.2)
Albumin: 4.4 g/dL (ref 3.5–4.7)
Alkaline Phosphatase: 49 IU/L (ref 39–117)
BUN/Creatinine Ratio: 14 (ref 12–28)
BUN: 15 mg/dL (ref 8–27)
Bilirubin Total: 0.4 mg/dL (ref 0.0–1.2)
CO2: 27 mmol/L (ref 20–29)
Calcium: 9.8 mg/dL (ref 8.7–10.3)
Chloride: 100 mmol/L (ref 96–106)
Creatinine, Ser: 1.06 mg/dL — ABNORMAL HIGH (ref 0.57–1.00)
GFR calc Af Amer: 55 mL/min/{1.73_m2} — ABNORMAL LOW (ref >59)
GFR calc non Af Amer: 47 mL/min/{1.73_m2} — ABNORMAL LOW (ref >59)
Globulin, Total: 2.6 g/dL (ref 1.5–4.5)
Glucose: 78 mg/dL (ref 65–99)
Potassium: 4 mmol/L (ref 3.5–5.2)
Sodium: 141 mmol/L (ref 134–144)
Total Protein: 7 g/dL (ref 6.0–8.5)

## 2018-09-20 LAB — RENAL FUNCTION PANEL: Phosphorus: 4 mg/dL (ref 2.5–4.5)

## 2018-09-20 NOTE — Telephone Encounter (Signed)
She called to let us know that she has not received the cologuard box at her home. She states they called and questioned her age and family history with insurance coverage. I discussed with her that I would refax order and that she could call her Medicare contact person to discuss with them coverage( but that it should cover), pt agrees.

## 2018-09-21 ENCOUNTER — Telehealth: Payer: Self-pay

## 2018-09-21 DIAGNOSIS — L570 Actinic keratosis: Secondary | ICD-10-CM | POA: Insufficient documentation

## 2018-09-21 NOTE — Telephone Encounter (Signed)
-----   Message from [name]., MD sent at 09/21/2018  9:29 AM EDT ----- stable

## 2018-09-21 NOTE — Telephone Encounter (Signed)
LMTCB   ----- Message from [name]., MD sent at 09/21/2018  9:29 AM EDT ----- stable

## 2018-09-25 DIAGNOSIS — H40153 Residual stage of open-angle glaucoma, bilateral: Secondary | ICD-10-CM | POA: Diagnosis not present

## 2018-09-26 DIAGNOSIS — H8110 Benign paroxysmal vertigo, unspecified ear: Secondary | ICD-10-CM | POA: Diagnosis not present

## 2018-09-26 DIAGNOSIS — R42 Dizziness and giddiness: Secondary | ICD-10-CM | POA: Diagnosis not present

## 2018-09-26 NOTE — Telephone Encounter (Signed)
LMTCB ED 

## 2018-09-26 NOTE — Telephone Encounter (Signed)
Advised  ED 

## 2018-10-01 ENCOUNTER — Telehealth: Payer: Self-pay | Admitting: General Surgery

## 2018-10-01 DIAGNOSIS — H8112 Benign paroxysmal vertigo, left ear: Secondary | ICD-10-CM | POA: Diagnosis not present

## 2018-10-01 NOTE — Telephone Encounter (Signed)
Patient called and said she still has not received the colo- guard and she is waiting for it to come through the mail this is why we haven't heard anything.

## 2018-10-02 NOTE — Telephone Encounter (Signed)
Advised patient the colo- guard is on the way .

## 2018-10-09 DIAGNOSIS — H8112 Benign paroxysmal vertigo, left ear: Secondary | ICD-10-CM | POA: Diagnosis not present

## 2018-10-11 ENCOUNTER — Telehealth: Payer: Self-pay | Admitting: *Deleted

## 2018-10-11 NOTE — Telephone Encounter (Signed)
Patient called back and states that it is not covered by Medicare based on age, out of pocket cost could be as much as $649, so the patient declines the testing.  Follow up as scheduled, pt agrees.

## 2018-10-11 NOTE — Telephone Encounter (Signed)
Patient called and stated that she has still not received the cologuard in the mail yet

## 2018-10-11 NOTE — Telephone Encounter (Signed)
When I called Cologuard they informed me that they were awaiting on Medicare to approve the testing, no authorization needed before age 82 but needed after age 52. They had sent necessary form to Medicare for authorization. Pt aware and will call her contact person to make sure. Pt does not want Dr Bary Castilla to be upset that she has not done this test and not be upset with her if she choices not to have it done, assurance given that that is not the case.

## 2018-10-17 DIAGNOSIS — H8112 Benign paroxysmal vertigo, left ear: Secondary | ICD-10-CM | POA: Diagnosis not present

## 2018-10-25 DIAGNOSIS — H8112 Benign paroxysmal vertigo, left ear: Secondary | ICD-10-CM | POA: Diagnosis not present

## 2018-11-01 ENCOUNTER — Ambulatory Visit: Payer: PPO | Admitting: Oncology

## 2018-11-02 ENCOUNTER — Encounter: Payer: Self-pay | Admitting: Oncology

## 2018-11-02 ENCOUNTER — Inpatient Hospital Stay: Payer: PPO | Attending: Oncology | Admitting: Oncology

## 2018-11-02 ENCOUNTER — Other Ambulatory Visit: Payer: Self-pay

## 2018-11-02 VITALS — BP 151/83 | HR 80 | Temp 96.7°F | Resp 18 | Wt 182.9 lb

## 2018-11-02 DIAGNOSIS — Z171 Estrogen receptor negative status [ER-]: Secondary | ICD-10-CM | POA: Diagnosis not present

## 2018-11-02 DIAGNOSIS — I10 Essential (primary) hypertension: Secondary | ICD-10-CM

## 2018-11-02 DIAGNOSIS — Z85820 Personal history of malignant melanoma of skin: Secondary | ICD-10-CM | POA: Insufficient documentation

## 2018-11-02 DIAGNOSIS — C50211 Malignant neoplasm of upper-inner quadrant of right female breast: Secondary | ICD-10-CM | POA: Diagnosis not present

## 2018-11-02 DIAGNOSIS — C50919 Malignant neoplasm of unspecified site of unspecified female breast: Secondary | ICD-10-CM

## 2018-11-02 DIAGNOSIS — Z79899 Other long term (current) drug therapy: Secondary | ICD-10-CM

## 2018-11-02 NOTE — Progress Notes (Signed)
Hematology/Oncology follow up  note [facility] [phone] [phone]   Patient Care Team: [name]., MD as PCP - General (Family Medicine) Lorelee Cover., MD as Consulting Physician (Ophthalmology) Oneta Rack, MD as Consulting Physician (Dermatology) Bary Castilla, Forest Gleason, MD (General Surgery) Earlie Server, MD as Consulting Physician (Oncology)  REFERRING PROVIDER: Dr.Gilbert CHIEF COMPLAINTS/REASON FOR VISIT:  Evaluation of breast cancer.  HISTORY OF PRESENTING ILLNESS:  Kelly Clark is a  84 y.o.  female with PMH listed below who was referred to me for evaluation of breast cancer.  Patient had mammogram in 12/01/2017 which showed possible right breast mass.  She had a diagnostic mammogram on 12/20/2017 which showed right breast slightly lower inner quadrant 4 mm mammographically appearing nodule without sonographic correlation.  Recommend patient to have a diagnostic mammogram and ultrasound of the right breast in 6 months. Repeat mammogram diagnostic 06/22/2018 showed 6 mm mass in the upper inner quadrant of the right breast with spiculated borders.  Ultrasound confirmed with irregular hypoechoic mass in the right breast at 2:00 9 cm from nipple measuring 6 x 5 x 5 mm.  Sonographic evaluation of right axilla does not show any enlarged adenopathy.  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  S/p lumpectomy sentinel LN biopsy. on 07/23/2018  Hormone receptor negative, HER2 positive breast cancer, pT1b N0  Adjuvant Chemotherapy was recommended.  Explained that standard adjuvant chemotherapy recommends Taxol and Herceptin, which gives excellent 7-year disease-free survival rates of about 93%. Older studies prior to Herceptin era showed 8 year recurrence rate of 17% with chemotherapy.  However given her advanced age and grade 2 pre- existing neuropathy, She  is not interested in getting chemotherapy which can worsen her neuropathy.   Given the lack of prospective clinical trial data to compare adjuvant monotherapy with Herceptin to placebo in trials, question remains as to whether there is a place for anti-HER2 therapy without chemotherapy in individually selected patients.  Observational data analysis of a small but considerable subgroup of patients who received trastuzumab without chemotherapy database of [place] observational study showed about recurrence free rate of 80% in 5 years. historical series of patients with HER2-positive breast cancer who did not receive adjuvant chemotherapy or trastuzumab has RFS about 77% in 5 year.  The observation study showed a possible considerable benefit for monotherapy of Transtuzumab x 1 year. Recent abstract of report of RESPECT trial [abstract] in which HER2 positive elderly patient , Comparison of restricted mean survival times revealed the lost survival due to omitting chemotherapy was less than 1 month at 3 years. Monotherapy of Herceptin provides better quality of life and is an acceptable option for elderly patient. Discussed in detail with patient and her niece.  I recommend adjuvant monotherapy Herceptin treatment.   Patient declined proceeding any chemotherapy treatment.    I recommend her to meet Radiation Oncology to discuss adjuvant RT which will reduce local recurrence rate and patient was not interested.     INTERVAL HISTORY Kelly Clark is a 84 y.o. female who has above history reviewed by me today presents for follow up visit for management of pT1bN0 Hormone receptor negative, HER2 positive breast cancer. S/p resection. Declined adjuvant chemotherapy and radiation.  Reports feeling well at baseline. Accompanied by her niece who brought a list of questions from patient's daughter.   Review of Systems  Constitutional: Negative for chills, fever, malaise/fatigue and weight loss.  HENT: Negative for  nosebleeds and sore throat.   Eyes: Negative for double vision, photophobia and redness.  Respiratory: Negative for cough, shortness of breath and wheezing.   Cardiovascular: Negative for chest pain, palpitations and orthopnea.  Gastrointestinal: Negative for abdominal pain, blood in stool, nausea and vomiting.  Genitourinary: Negative for dysuria.  Musculoskeletal: Negative for back pain, myalgias and neck pain.  Skin: Negative for itching and rash.  Neurological: Negative for dizziness, tingling and tremors.  Endo/Heme/Allergies: Negative for environmental allergies. Does not bruise/bleed easily.  Psychiatric/Behavioral: Negative for depression.      MEDICAL HISTORY:  Past Medical History:  Diagnosis Date  . Basal cell carcinoma 2019   RIGHT THUMB REGION  . Breast cancer ([place]) 06/27/2018   RIGHT  . Cancer ([place]) 1977   melanoma . SURGERY RIGHT ARM  . Hypertension     SURGICAL HISTORY: Past Surgical History:  Procedure Laterality Date  . ABDOMINAL HYSTERECTOMY  1977  . BREAST BIOPSY Bilateral 1977   benign  . BREAST BIOPSY Right 06/27/2018   INVASIVE MAMMARY CARCINOMA, NO SPECIAL TYPE. 2 oclock ER/PR negative HER2 positive  . BREAST EXCISIONAL BIOPSY Right 07/23/2018   lumpectomy  . BREAST LUMPECTOMY WITH SENTINEL LYMPH NODE BIOPSY Right 07/23/2018   6 mm ER/PR negative, Her 2 neu 3+; node negative.  Surgeon: Robert Bellow, MD;  Location: [facility] ORS;  Service: General;  Laterality: Right;  . COLONOSCOPY  2007  . EYE SURGERY Bilateral    CATARACT EXTRACTIONS    SOCIAL HISTORY: Social History   Socioeconomic History  . Marital status: Widowed    Spouse name: Not on file  . Number of children: 1  . Years of education: H/S  . Highest education level: Some college, no degree  Occupational History  . Occupation: Retired  Scientific laboratory technician  . Financial resource strain: Not hard at all  . Food insecurity:    Worry: Never true    Inability: Never true  . Transportation  needs:    Medical: No    Non-medical: No  Tobacco Use  . Smoking status: Never Smoker  . Smokeless tobacco: Never Used  Substance and Sexual Activity  . Alcohol use: No  . Drug use: No  . Sexual activity: Not Currently  Lifestyle  . Physical activity:    Days per week: Not on file    Minutes per session: Not on file  . Stress: Only a little  Relationships  . Social connections:    Talks on phone: Not on file    Gets together: Not on file    Attends religious service: Not on file    Active member of club or organization: Not on file    Attends meetings of clubs or organizations: Not on file    Relationship status: Not on file  . Intimate partner violence:    Fear of current or ex partner: Not on file    Emotionally abused: Not on file    Physically abused: Not on file    Forced sexual activity: Not on file  Other Topics Concern  . Not on file  Social History Narrative  . Not on file    FAMILY HISTORY: Family History  Problem Relation Age of Onset  . Heart attack Mother   . Congestive Heart Failure Mother   . Stroke Father   . Colon cancer Sister 22  . Throat cancer Maternal Grandmother 41  . Healthy Daughter     ALLERGIES:  has No Known Allergies.  MEDICATIONS:  Current Outpatient Medications  Medication Sig Dispense Refill  . amLODipine (NORVASC) 5 MG tablet TAKE 1 TABLET BY MOUTH ONCE DAILY 90 tablet 0  . Cholecalciferol (VITAMIN D3) 10000 units TABS Take 10,000 Units by mouth at bedtime.    . clobetasol ointment (TEMOVATE) 3.08 % Apply 1 application topically once a week. Applied to affected area of vagina and rectum     . gabapentin (NEURONTIN) 600 MG tablet TAKE 1 TABLET BY MOUTH AT BEDTIME 90 tablet 3  . hydrochlorothiazide (HYDRODIURIL) 12.5 MG tablet TAKE 1 TABLET BY MOUTH ONCE DAILY 90 tablet 3  . latanoprost (XALATAN) 0.005 % ophthalmic solution Place 1 drop into both eyes at bedtime.   0  . Multiple Vitamin (MULTIVITAMIN) tablet Take 1 tablet by mouth  daily.    . multivitamin-lutein (OCUVITE-LUTEIN) CAPS capsule Take 1 capsule by mouth every morning.     . polyethylene glycol powder (MIRALAX) powder Take 17 g by mouth at bedtime.     Marland Kitchen PROBIOTIC PRODUCT PO Take 1 capsule by mouth daily with lunch.     . RESVERATROL-GRAPE PO Take 1 capsule by mouth daily. CoQ10 Resveratrol & Grape Seed     No current facility-administered medications for this visit.      PHYSICAL EXAMINATION: ECOG PERFORMANCE STATUS: 0 - Asymptomatic Vitals:   11/02/18 1407  BP: (!) 151/83  Pulse: 80  Resp: 18  Temp: (!) 96.7 F (35.9 C)   Filed Weights   11/02/18 1407  Weight: 182 lb 14.4 oz (83 kg)    Physical Exam  Constitutional: She is oriented to person, place, and time. No distress.  HENT:  Head: Normocephalic and atraumatic.  Mouth/Throat: Oropharynx is clear and moist.  Eyes: Pupils are equal, round, and reactive to light. Conjunctivae and EOM are normal. No scleral icterus.  Neck: Normal range of motion. Neck supple.  Cardiovascular: Normal rate, regular rhythm and normal heart sounds.  Pulmonary/Chest: Effort normal. No respiratory distress. She has no wheezes. She has no rales. She exhibits no tenderness.  Abdominal: Soft. Bowel sounds are normal. She exhibits no distension and no mass. There is no tenderness.  Musculoskeletal: Normal range of motion. She exhibits no edema or deformity.  Lymphadenopathy:    She has no cervical adenopathy.  Neurological: She is alert and oriented to person, place, and time. No cranial nerve deficit. Coordination normal.  Skin: Skin is warm and dry. No rash noted. No erythema.  Psychiatric: She has a normal mood and affect.   Breast exam was performed in seated and lying down position. S/p right lumpectomy and sentinel LN biopsy.   No evidence of any palpable masses bilaterally. No evidence of bilateral axillary adenopathy  LABORATORY DATA:  I have reviewed the data as listed Lab Results  Component Value  Date   WBC 6.3 09/19/2018   HGB 14.1 09/19/2018   HCT 42.4 09/19/2018   MCV 94 09/19/2018   PLT 199 09/19/2018   Recent Labs    07/09/18 1315 09/19/18 1156  NA 141 141  K 4.2 4.0  CL 106 100  CO2 27 27  GLUCOSE 105* 78  BUN 15 15  CREATININE 0.94 1.06*  CALCIUM 9.5 9.8  GFRNONAA 53* 47*  GFRAA >60 55*  PROT 7.2 7.0  ALBUMIN 4.3 4.4  AST 26 21  ALT 21 17  ALKPHOS 41 49  BILITOT 0.8 0.4   Iron/TIBC/Ferritin/ %Sat No results found for: IRON, TIBC, FERRITIN, IRONPCTSAT     ASSESSMENT & PLAN:  1. Malignant neoplasm of upper-inner quadrant of right breast in female, estrogen receptor negative ([place])   2. HER2-positive carcinoma of breast ([place])    Recommend repeat right diagnostic mammogram 6 months after surgery.  Patient's niece brought a list of questions from daughter. I tried my best answering all questions to their satisfaction.  Patient feels that now she may want to go through adjuvant Herceptin treatment, but can not make up her mind.  She wonders if she will still be able to be started on adjuvant if she desires treatment. Discussed with patient that ideally, adjuvant chemotherapy should start within 4-6 weeks of treatment, starting now may not exert full benefit. . If she wants to treatment, I think it is reasonable to start now, although not ideal, 1 year of adjuvant Herceptin may still reduce her recurrence rate.  She will think about it and update me if she changes her mind.   Orders Placed This Encounter  Procedures  . MM DIAG BREAST TOMO UNI RIGHT    Standing Status:   Future    Standing Expiration Date:   11/03/2019    Order Specific Question:   Reason for Exam (SYMPTOM  OR DIAGNOSIS REQUIRED)    Answer:   history of breast cancer    Order Specific Question:   Preferred imaging location?    Answer:   [facility]  . US Breast Limited Uni Right Inc Axilla    Standing Status:   Future    Standing Expiration Date:   01/03/2020    Order Specific  Question:   Reason for Exam (SYMPTOM  OR DIAGNOSIS REQUIRED)    Answer:   History of breast cancer    Order Specific Question:   Preferred imaging location?    Answer:   [facility]  . CBC with Differential/Platelet    Standing Status:   Future    Standing Expiration Date:   11/02/2019     We spent sufficient time to discuss many aspect of care, questions were answered to patient's satisfaction. The patient knows to call the clinic with any problems questions or concerns.  Return of visit: 3 months.cbc cmp  Total face to face encounter time for this patient visit was 45 min. >50% of the time was  spent in counseling and coordination of care.  Earlie Server, MD, PhD Hematology Oncology [facility] at [facility] Pager- [phone] 11/02/2018

## 2018-11-02 NOTE — Progress Notes (Signed)
Patient here for follow up. Pt has been having episodes of vertigo, for which see has seen PCP and ENT.

## 2018-11-08 DIAGNOSIS — H8112 Benign paroxysmal vertigo, left ear: Secondary | ICD-10-CM | POA: Diagnosis not present

## 2018-11-20 ENCOUNTER — Ambulatory Visit: Payer: PPO | Admitting: General Surgery

## 2018-11-27 ENCOUNTER — Ambulatory Visit (INDEPENDENT_AMBULATORY_CARE_PROVIDER_SITE_OTHER): Payer: PPO | Admitting: General Surgery

## 2018-11-27 ENCOUNTER — Other Ambulatory Visit: Payer: Self-pay

## 2018-11-27 ENCOUNTER — Encounter: Payer: Self-pay | Admitting: General Surgery

## 2018-11-27 VITALS — BP 181/81 | HR 73 | Temp 97.8°F | Resp 14 | Ht 62.0 in | Wt 181.8 lb

## 2018-11-27 DIAGNOSIS — Z1211 Encounter for screening for malignant neoplasm of colon: Secondary | ICD-10-CM

## 2018-11-27 DIAGNOSIS — C50211 Malignant neoplasm of upper-inner quadrant of right female breast: Secondary | ICD-10-CM | POA: Diagnosis not present

## 2018-11-27 DIAGNOSIS — Z171 Estrogen receptor negative status [ER-]: Secondary | ICD-10-CM | POA: Diagnosis not present

## 2018-11-27 NOTE — Patient Instructions (Signed)
heduled left breast mammogram in February 2020.

## 2018-11-27 NOTE — Progress Notes (Signed)
Patient ID: Kelly Clark, female   DOB: 18-Feb-1929, 84 y.o.   MRN: [id number]  Chief Complaint  Patient presents with  . Breast Cancer  . Colonoscopy    HPI Kelly Clark is a 84 y.o. female here today for her follow up breast cancer check and discuss colonoscopy. Last colonoscopy was 2007. Patient moves her bowels daily.  HPI  Past Medical History:  Diagnosis Date  . Basal cell carcinoma 2019   RIGHT THUMB REGION  . Breast cancer ([place]) 06/27/2018   RIGHT  . Cancer ([place]) 1977   melanoma . SURGERY RIGHT ARM  . Hypertension     Past Surgical History:  Procedure Laterality Date  . ABDOMINAL HYSTERECTOMY  1977  . BREAST BIOPSY Bilateral 1977   benign  . BREAST BIOPSY Right 06/27/2018   INVASIVE MAMMARY CARCINOMA, NO SPECIAL TYPE. 2 oclock ER/PR negative HER2 positive  . BREAST EXCISIONAL BIOPSY Right 07/23/2018   lumpectomy  . BREAST LUMPECTOMY WITH SENTINEL LYMPH NODE BIOPSY Right 07/23/2018   6 mm ER/PR negative, Her 2 neu 3+; node negative.  Surgeon: Robert Bellow, MD;  Location: [facility] ORS;  Service: General;  Laterality: Right;  . COLONOSCOPY  2007  . EYE SURGERY Bilateral    CATARACT EXTRACTIONS    Family History  Problem Relation Age of Onset  . Heart attack Mother   . Congestive Heart Failure Mother   . Stroke Father   . Colon cancer Sister 78  . Throat cancer Maternal Grandmother 36  . Healthy Daughter     Social History Social History   Tobacco Use  . Smoking status: Never Smoker  . Smokeless tobacco: Never Used  Substance Use Topics  . Alcohol use: No  . Drug use: No    No Known Allergies  Current Outpatient Medications  Medication Sig Dispense Refill  . amLODipine (NORVASC) 5 MG tablet TAKE 1 TABLET BY MOUTH ONCE DAILY 90 tablet 0  . Cholecalciferol (VITAMIN D3) 10000 units TABS Take 10,000 Units by mouth at bedtime.    . clobetasol ointment (TEMOVATE) 0.25 % Apply 1 application topically once a week. Applied to affected area of vagina and  rectum     . gabapentin (NEURONTIN) 600 MG tablet TAKE 1 TABLET BY MOUTH AT BEDTIME 90 tablet 3  . hydrochlorothiazide (HYDRODIURIL) 12.5 MG tablet TAKE 1 TABLET BY MOUTH ONCE DAILY 90 tablet 3  . latanoprost (XALATAN) 0.005 % ophthalmic solution Place 1 drop into both eyes at bedtime.   0  . Multiple Vitamin (MULTIVITAMIN) tablet Take 1 tablet by mouth daily.    . multivitamin-lutein (OCUVITE-LUTEIN) CAPS capsule Take 1 capsule by mouth every morning.     . polyethylene glycol powder (MIRALAX) powder Take 17 g by mouth at bedtime.     Marland Kitchen PROBIOTIC PRODUCT PO Take 1 capsule by mouth daily with lunch.     . RESVERATROL-GRAPE PO Take 1 capsule by mouth daily. CoQ10 Resveratrol & Grape Seed     No current facility-administered medications for this visit.     Review of Systems Review of Systems  Constitutional: Negative.   Respiratory: Negative.   Cardiovascular: Negative.     Blood pressure (!) 181/81, pulse 73, temperature 97.8 F (36.6 C), temperature source Skin, resp. rate 14, height '5\' 2"'$  (1.575 m), weight 181 lb 12.8 oz (82.5 kg), SpO2 98 %.  Physical Exam Physical Exam Constitutional:      Appearance: She is well-developed and well-nourished.  Eyes:     General: No  scleral icterus.    Conjunctiva/sclera: Conjunctivae normal.  Neck:     Musculoskeletal: Neck supple.  Cardiovascular:     Rate and Rhythm: Normal rate and regular rhythm.     Heart sounds: Normal heart sounds.  Pulmonary:     Effort: Pulmonary effort is normal.     Breath sounds: Normal breath sounds.  Chest:     Breasts:        Right: No inverted nipple, mass, nipple discharge, skin change or tenderness.        Left: No inverted nipple, mass, nipple discharge, skin change or tenderness.    Lymphadenopathy:     Cervical: No cervical adenopathy.     Upper Body:  No axillary adenopathy present. Skin:    General: Skin is warm and dry.  Neurological:     Mental Status: She is alert and oriented to person,  place, and time.     Data Reviewed Medical oncology notes reviewed.   Assessment    Doing well status post breast conservation.      Plan  The patient was presently scheduled for a right breast mammogram in February 2020 by medical oncology.  We will add a left breast mammogram at the same time, as the patient would be due for that study in December 2019.  No indication to have her go with separate dates.  Dr. Alben Spittle has been notified of the patient's elevated blood pressure by secure chat and arrangements are in place for a follow-up.  At the time of the patient's original evaluation for her breast cancer we discussed whether she should have a colonoscopy based on her age and family history.  After the patient left and over the next 48 hours I spoke with 5 board-certified gastroenterologist the majority of the opinions were that a follow-up exam was not indicated.  This is been conveyed to the patient on telephone message today.  The patient is aware to call back for any questions or concerns.    HPI, Physical Exam, Assessment and Plan have been scribed under the direction and in the presence of Hervey Ard, MD.  Gaspar Cola, CMA  I have completed the exam and reviewed the above documentation for accuracy and completeness.  I agree with the above.  Haematologist has been used and any errors in dictation or transcription are unintentional.  Hervey Ard, M.D., F.A.C.S.  [name] 11/29/2018, 8:27 AM

## 2018-11-29 ENCOUNTER — Ambulatory Visit (INDEPENDENT_AMBULATORY_CARE_PROVIDER_SITE_OTHER): Payer: PPO | Admitting: Family Medicine

## 2018-11-29 ENCOUNTER — Encounter: Payer: Self-pay | Admitting: Family Medicine

## 2018-11-29 ENCOUNTER — Telehealth: Payer: Self-pay | Admitting: General Surgery

## 2018-11-29 VITALS — BP 160/78 | HR 76 | Temp 97.7°F | Wt 181.8 lb

## 2018-11-29 DIAGNOSIS — I10 Essential (primary) hypertension: Secondary | ICD-10-CM

## 2018-11-29 MED ORDER — AMLODIPINE BESYLATE 10 MG PO TABS: 10.0000 mg | ORAL_TABLET | Freq: Every day | ORAL | 0 refills | Status: DC

## 2018-11-29 NOTE — Patient Instructions (Addendum)
Increase amlodipine to 10mg  daily No other medicine changes   Hypertension Hypertension is another name for high blood pressure. High blood pressure forces your heart to work harder to pump blood. This can cause problems over time. There are two numbers in a blood pressure reading. There is a top number (systolic) over a bottom number (diastolic). It is best to have a blood pressure below 120/80. Healthy choices can help lower your blood pressure. You may need medicine to help lower your blood pressure if:  Your blood pressure cannot be lowered with healthy choices.  Your blood pressure is higher than 130/80.  Follow these instructions at home: Eating and drinking  If directed, follow the DASH eating plan. This diet includes: ? Filling half of your plate at each meal with fruits and vegetables. ? Filling one quarter of your plate at each meal with whole grains. Whole grains include whole wheat pasta, brown rice, and whole grain bread. ? Eating or drinking low-fat dairy products, such as skim milk or low-fat yogurt. ? Filling one quarter of your plate at each meal with low-fat (lean) proteins. Low-fat proteins include fish, skinless chicken, eggs, beans, and tofu. ? Avoiding fatty meat, cured and processed meat, or chicken with skin. ? Avoiding premade or processed food.  Eat less than 1,500 mg of salt (sodium) a day.  Limit alcohol use to no more than 1 drink a day for nonpregnant women and 2 drinks a day for men. One drink equals 12 oz of beer, 5 oz of wine, or 1 oz of hard liquor. Lifestyle  Work with your doctor to stay at a healthy weight or to lose weight. Ask your doctor what the best weight is for you.  Get at least 30 minutes of exercise that causes your heart to beat faster (aerobic exercise) most days of the week. This may include walking, swimming, or biking.  Get at least 30 minutes of exercise that strengthens your muscles (resistance exercise) at least 3 days a week.  This may include lifting weights or pilates.  Do not use any products that contain nicotine or tobacco. This includes cigarettes and e-cigarettes. If you need help quitting, ask your doctor.  Check your blood pressure at home as told by your doctor.  Keep all follow-up visits as told by your doctor. This is important. Medicines  Take over-the-counter and prescription medicines only as told by your doctor. Follow directions carefully.  Do not skip doses of blood pressure medicine. The medicine does not work as well if you skip doses. Skipping doses also puts you at risk for problems.  Ask your doctor about side effects or reactions to medicines that you should watch for. Contact a doctor if:  You think you are having a reaction to the medicine you are taking.  You have headaches that keep coming back (recurring).  You feel dizzy.  You have swelling in your ankles.  You have trouble with your vision. Get help right away if:  You get a very bad headache.  You start to feel confused.  You feel weak or numb.  You feel faint.  You get very bad pain in your: ? Chest. ? Belly (abdomen).  You throw up (vomit) more than once.  You have trouble breathing. Summary  Hypertension is another name for high blood pressure.  Making healthy choices can help lower blood pressure. If your blood pressure cannot be controlled with healthy choices, you may need to take medicine. This information is not  intended to replace advice given to you by your health care provider. Make sure you discuss any questions you have with your health care provider. Document Released: 05/23/2008 Document Revised: 11/02/2016 Document Reviewed: 11/02/2016 Elsevier Interactive Patient Education  [employer].

## 2018-11-29 NOTE — Assessment & Plan Note (Signed)
Uncontrolled today Goal BP is <150/90 Think home BP cuff is likely not accurate Did discuss that checking BP repeatedly will cause it to seem elevated Wonder if she was so elevated at recent appt with surgery due to stress or something Will increase amlodipine to 10mg  daily and continue hctz at current dose Discussed hypotension precautions F/u in 2-4 weeks

## 2018-11-29 NOTE — Telephone Encounter (Signed)
At the time of the patient's last visit we had a discussion about whether she should or should not have a colonoscopy, which I had originally recommended prior to the treatment of her breast cancer.  Since her visit 2 days ago I have spoken with 5 board-certified gastroenterologist and presented the case to them.  2 flat out said no based on current guidelines, 2 individuals said " Well that would be a tough call but they might"  and one reluctantly said yes.  Considering the patient's sister had rectal cancer at a very advanced age, the patient's clear exam at age 84 and absence of symptoms at this time, I am withdrawing my recommendation for colonoscopy.  The patient does have an appointment later today with her PCPs office to recheck her blood pressure.

## 2018-11-29 NOTE — Progress Notes (Signed)
Patient: Kelly Clark Female    DOB: 1929/10/13   84 y.o.   MRN: [id number] Visit Date: 11/29/2018  Today's Provider: Lavon Paganini, MD   Chief Complaint  Patient presents with  . Hypertension   Subjective:    I, Tiburcio Pea, CMA, am acting as a Education administrator for Lavon Paganini, MD.  HPI  Hypertension, follow-up:  BP Readings from Last 3 Encounters:  11/29/18 (!) 160/78  11/27/18 (!) 181/81  11/02/18 (!) 151/83    She was last seen for hypertension 2 months ago.  BP at that visit was 142/68. Management changes since that visit include no changes. She reports good compliance with treatment. She is not having side effects.  She is exercising. She is adherent to low salt diet.   Outside blood pressures are being checked. Patient reports she seen Dr. Bary Castilla and BP was 181/81 . She is experiencing none.  Patient denies chest pain, chest pressure/discomfort, claudication, dyspnea, exertional chest pressure/discomfort, fatigue, irregular heart beat, lower extremity edema, near-syncope, orthopnea, palpitations, paroxysmal nocturnal dyspnea, syncope and tachypnea.   Cardiovascular risk factors include advanced age (older than 61 for men, 23 for women), hypertension and obesity (BMI >= 30 kg/m2).  Use of agents associated with hypertension: none.     Weight trend: stable Wt Readings from Last 3 Encounters:  11/29/18 181 lb 12.8 oz (82.5 kg)  11/27/18 181 lb 12.8 oz (82.5 kg)  11/02/18 182 lb 14.4 oz (83 kg)   ------------------------------------------------------------------------      No Known Allergies   Current Outpatient Medications:  .  amLODipine (NORVASC) 10 MG tablet, Take 1 tablet (10 mg total) by mouth daily., Disp: 90 tablet, Rfl: 0 .  Cholecalciferol (VITAMIN D3) 10000 units TABS, Take 10,000 Units by mouth at bedtime., Disp: , Rfl:  .  clobetasol ointment (TEMOVATE) 0.93 %, Apply 1 application topically once a week. Applied to affected area of vagina  and rectum , Disp: , Rfl:  .  gabapentin (NEURONTIN) 600 MG tablet, TAKE 1 TABLET BY MOUTH AT BEDTIME, Disp: 90 tablet, Rfl: 3 .  hydrochlorothiazide (HYDRODIURIL) 12.5 MG tablet, TAKE 1 TABLET BY MOUTH ONCE DAILY, Disp: 90 tablet, Rfl: 3 .  latanoprost (XALATAN) 0.005 % ophthalmic solution, Place 1 drop into both eyes at bedtime. , Disp: , Rfl: 0 .  Multiple Vitamin (MULTIVITAMIN) tablet, Take 1 tablet by mouth daily., Disp: , Rfl:  .  multivitamin-lutein (OCUVITE-LUTEIN) CAPS capsule, Take 1 capsule by mouth every morning. , Disp: , Rfl:  .  polyethylene glycol powder (MIRALAX) powder, Take 17 g by mouth at bedtime. , Disp: , Rfl:  .  PROBIOTIC PRODUCT PO, Take 1 capsule by mouth daily with lunch. , Disp: , Rfl:  .  RESVERATROL-GRAPE PO, Take 1 capsule by mouth daily. CoQ10 Resveratrol & Grape Seed, Disp: , Rfl:   Review of Systems  Constitutional: Negative.   Respiratory: Negative.   Cardiovascular: Negative.   Musculoskeletal: Negative.     Social History   Tobacco Use  . Smoking status: Never Smoker  . Smokeless tobacco: Never Used  Substance Use Topics  . Alcohol use: No   Objective:   BP (!) 160/78   Pulse 76   Temp 97.7 F (36.5 C) (Oral)   Wt 181 lb 12.8 oz (82.5 kg)   SpO2 96%   BMI 33.25 kg/m  Vitals:   11/29/18 1042 11/29/18 1116  BP: (!) 144/81 (!) 160/78  Pulse: 76   Temp: 97.7 F (36.5 C)  TempSrc: Oral   SpO2: 96%   Weight: 181 lb 12.8 oz (82.5 kg)      Physical Exam Vitals signs reviewed.  Constitutional:      General: She is not in acute distress. HENT:     Head: Normocephalic and atraumatic.     Nose: Nose normal.     Mouth/Throat:     Mouth: Mucous membranes are moist.     Pharynx: Oropharynx is clear.  Eyes:     General: No scleral icterus.    Conjunctiva/sclera: Conjunctivae normal.  Neck:     Musculoskeletal: Neck supple.  Cardiovascular:     Rate and Rhythm: Normal rate and regular rhythm.  Pulmonary:     Effort: Pulmonary  effort is normal. No respiratory distress.     Breath sounds: Normal breath sounds. No wheezing or rhonchi.  Musculoskeletal:     Right lower leg: No edema.     Left lower leg: No edema.  Lymphadenopathy:     Cervical: No cervical adenopathy.  Skin:    General: Skin is warm and dry.     Capillary Refill: Capillary refill takes less than 2 seconds.     Findings: No rash.  Neurological:     Mental Status: She is alert and oriented to person, place, and time.  Psychiatric:        Mood and Affect: Mood normal.        Assessment & Plan:   Problem List Items Addressed This Visit      Cardiovascular and Mediastinum   Benign essential HTN - Primary    Uncontrolled today Goal BP is <150/90 Think home BP cuff is likely not accurate Did discuss that checking BP repeatedly will cause it to seem elevated Wonder if she was so elevated at recent appt with surgery due to stress or something Will increase amlodipine to 10mg  daily and continue hctz at current dose Discussed hypotension precautions F/u in 2-4 weeks      Relevant Medications   amLODipine (NORVASC) 10 MG tablet       Return in about 4 weeks (around 12/27/2018) for BP f/u.   The entirety of the information documented in the History of Present Illness, Review of Systems and Physical Exam were personally obtained by me. Portions of this information were initially documented by Tiburcio Pea, CMA and reviewed by me for thoroughness and accuracy.    Virginia Crews, MD, MPH [facility] 11/29/2018 11:20 AM

## 2018-12-25 ENCOUNTER — Encounter: Payer: Self-pay | Admitting: Family Medicine

## 2018-12-25 ENCOUNTER — Ambulatory Visit (INDEPENDENT_AMBULATORY_CARE_PROVIDER_SITE_OTHER): Payer: PPO | Admitting: Family Medicine

## 2018-12-25 VITALS — BP 148/68 | HR 83 | Temp 98.0°F | Resp 16 | Wt 181.0 lb

## 2018-12-25 DIAGNOSIS — E781 Pure hyperglyceridemia: Secondary | ICD-10-CM | POA: Diagnosis not present

## 2018-12-25 DIAGNOSIS — Z171 Estrogen receptor negative status [ER-]: Secondary | ICD-10-CM | POA: Diagnosis not present

## 2018-12-25 DIAGNOSIS — I10 Essential (primary) hypertension: Secondary | ICD-10-CM | POA: Diagnosis not present

## 2018-12-25 DIAGNOSIS — C50211 Malignant neoplasm of upper-inner quadrant of right female breast: Secondary | ICD-10-CM

## 2018-12-25 NOTE — Progress Notes (Signed)
Patient: Kelly Clark Female    DOB: 05-28-1929   85 y.o.   MRN: [id number] Visit Date: 12/25/2018  Today's Provider: Wilhemena Durie, MD   Chief Complaint  Patient presents with  . Hypertension   Subjective:     HPI Patient comes in today for a recheck in BP. She was seen in the office 3 weeks ago by Dr. B and was advised to increase amlodipine to 10mg  daily. She continued to take HCTZ at current dose of 12.5mg . She reports that she has tolerated the med changes well. She reports that BP at home averages in the 140s/70s. Overall she feels well. BP Readings from Last 3 Encounters:  12/25/18 (!) 148/68  11/29/18 (!) 160/78  11/27/18 (!) 181/81     No Known Allergies   Current Outpatient Medications:  .  amLODipine (NORVASC) 10 MG tablet, Take 1 tablet (10 mg total) by mouth daily., Disp: 90 tablet, Rfl: 0 .  Cholecalciferol (VITAMIN D3) 10000 units TABS, Take 10,000 Units by mouth at bedtime., Disp: , Rfl:  .  clobetasol ointment (TEMOVATE) 2.99 %, Apply 1 application topically once a week. Applied to affected area of vagina and rectum , Disp: , Rfl:  .  gabapentin (NEURONTIN) 600 MG tablet, TAKE 1 TABLET BY MOUTH AT BEDTIME, Disp: 90 tablet, Rfl: 3 .  hydrochlorothiazide (HYDRODIURIL) 12.5 MG tablet, TAKE 1 TABLET BY MOUTH ONCE DAILY, Disp: 90 tablet, Rfl: 3 .  latanoprost (XALATAN) 0.005 % ophthalmic solution, Place 1 drop into both eyes at bedtime. , Disp: , Rfl: 0 .  Multiple Vitamin (MULTIVITAMIN) tablet, Take 1 tablet by mouth daily., Disp: , Rfl:  .  multivitamin-lutein (OCUVITE-LUTEIN) CAPS capsule, Take 1 capsule by mouth every morning. , Disp: , Rfl:  .  polyethylene glycol powder (MIRALAX) powder, Take 17 g by mouth at bedtime. , Disp: , Rfl:  .  PROBIOTIC PRODUCT PO, Take 1 capsule by mouth daily with lunch. , Disp: , Rfl:  .  RESVERATROL-GRAPE PO, Take 1 capsule by mouth daily. CoQ10 Resveratrol & Grape Seed, Disp: , Rfl:   Review of Systems    Constitutional: Negative for activity change, appetite change and fatigue.  HENT: Negative.   Eyes: Negative.   Respiratory: Negative for cough and choking.   Cardiovascular: Negative for chest pain, palpitations and leg swelling.  Gastrointestinal: Negative.   Endocrine: Negative.   Musculoskeletal: Negative for arthralgias.  Allergic/Immunologic: Negative.   Neurological: Negative for dizziness, light-headedness and headaches.  Psychiatric/Behavioral: Negative for agitation and decreased concentration.    Social History   Tobacco Use  . Smoking status: Never Smoker  . Smokeless tobacco: Never Used  Substance Use Topics  . Alcohol use: No      Objective:   BP (!) 148/68 (BP Location: Left Arm, Patient Position: Sitting, Cuff Size: Normal)   Pulse 83   Temp 98 F (36.7 C)   Resp 16   Wt 181 lb (82.1 kg)   BMI 33.11 kg/m  Vitals:   12/25/18 1129  BP: (!) 148/68  Pulse: 83  Resp: 16  Temp: 98 F (36.7 C)  Weight: 181 lb (82.1 kg)     Physical Exam Constitutional:      Appearance: She is well-developed.  HENT:     Head: Normocephalic and atraumatic.  Eyes:     General: No scleral icterus. Neck:     Thyroid: No thyromegaly.  Cardiovascular:     Rate and Rhythm: Normal rate and  regular rhythm.     Heart sounds: Normal heart sounds.  Pulmonary:     Effort: Pulmonary effort is normal.     Breath sounds: Normal breath sounds.  Abdominal:     Palpations: Abdomen is soft.  Lymphadenopathy:     Cervical: No cervical adenopathy.  Skin:    General: Skin is warm and dry.  Neurological:     Mental Status: She is alert and oriented to person, place, and time.  Psychiatric:        Behavior: Behavior normal.        Thought Content: Thought content normal.        Judgment: Judgment normal.         Assessment & Plan    1. Benign essential HTN Recheck 137/78  2.h/o  Malignant neoplasm of upper-inner quadrant of right breast in female, estrogen receptor  negative ([place])   3. Hypertriglyceridemia     I have done the exam and reviewed the chart and it is accurate to the best of my knowledge. Development worker, community has been used and  any errors in dictation or transcription are unintentional. Miguel Aschoff M.D. [place], MD  [facility]

## 2019-01-08 ENCOUNTER — Ambulatory Visit
Admission: RE | Admit: 2019-01-08 | Discharge: 2019-01-08 | Disposition: A | Discharge status: Home / Self Care | Payer: PPO | Source: Ambulatory Visit | Attending: General Surgery | Admitting: General Surgery

## 2019-01-08 DIAGNOSIS — Z171 Estrogen receptor negative status [ER-]: Principal | ICD-10-CM

## 2019-01-08 DIAGNOSIS — Z1211 Encounter for screening for malignant neoplasm of colon: Secondary | ICD-10-CM

## 2019-01-08 DIAGNOSIS — C50211 Malignant neoplasm of upper-inner quadrant of right female breast: Secondary | ICD-10-CM

## 2019-01-08 DIAGNOSIS — R928 Other abnormal and inconclusive findings on diagnostic imaging of breast: Secondary | ICD-10-CM | POA: Diagnosis not present

## 2019-01-08 DIAGNOSIS — Z853 Personal history of malignant neoplasm of breast: Secondary | ICD-10-CM | POA: Diagnosis not present

## 2019-01-09 DIAGNOSIS — Z85820 Personal history of malignant melanoma of skin: Secondary | ICD-10-CM | POA: Diagnosis not present

## 2019-01-09 DIAGNOSIS — L900 Lichen sclerosus et atrophicus: Secondary | ICD-10-CM | POA: Diagnosis not present

## 2019-01-09 DIAGNOSIS — Z85828 Personal history of other malignant neoplasm of skin: Secondary | ICD-10-CM | POA: Diagnosis not present

## 2019-01-09 DIAGNOSIS — Z08 Encounter for follow-up examination after completed treatment for malignant neoplasm: Secondary | ICD-10-CM | POA: Diagnosis not present

## 2019-01-29 ENCOUNTER — Ambulatory Visit: Payer: PPO | Admitting: General Surgery

## 2019-01-29 DIAGNOSIS — H40153 Residual stage of open-angle glaucoma, bilateral: Secondary | ICD-10-CM | POA: Diagnosis not present

## 2019-02-06 ENCOUNTER — Other Ambulatory Visit: Payer: Self-pay

## 2019-02-06 ENCOUNTER — Ambulatory Visit: Payer: PPO | Admitting: Surgery

## 2019-02-06 ENCOUNTER — Encounter: Payer: Self-pay | Admitting: Surgery

## 2019-02-06 VITALS — BP 149/80 | HR 71 | Temp 96.1°F | Ht 62.0 in | Wt 178.0 lb

## 2019-02-06 DIAGNOSIS — Z171 Estrogen receptor negative status [ER-]: Secondary | ICD-10-CM | POA: Diagnosis not present

## 2019-02-06 DIAGNOSIS — C50211 Malignant neoplasm of upper-inner quadrant of right female breast: Secondary | ICD-10-CM | POA: Diagnosis not present

## 2019-02-06 NOTE — Patient Instructions (Addendum)
Patient will need to return to the office in 6 months with Dr.Byrnett and bilateral mammogram.    Call the office with any questions or concerns.

## 2019-02-08 ENCOUNTER — Encounter: Payer: Self-pay | Admitting: Surgery

## 2019-02-08 NOTE — Progress Notes (Signed)
Outpatient Surgical Follow Up  02/08/2019  Kelly Clark is an 85 y.o. female.   Chief Complaint  Patient presents with  . Follow-up     [facility] 01/08/19    HPI: s/p lumpectomy R for invasive Ca. Neg nodes. She declined radiation and chemo. She had a 6 month mammo that I have pers. Reviewed showing no evidence of recurrence. She denies any DC , pain  Or other issues. No weight loss.   Past Medical History:  Diagnosis Date  . Basal cell carcinoma 2019   RIGHT THUMB REGION  . Breast cancer ([place]) 06/27/2018   RIGHT  . Cancer ([place]) 1977   melanoma . SURGERY RIGHT ARM  . Hypertension     Past Surgical History:  Procedure Laterality Date  . ABDOMINAL HYSTERECTOMY  1977  . BREAST BIOPSY Bilateral 1977   benign  . BREAST BIOPSY Right 06/27/2018   INVASIVE MAMMARY CARCINOMA, NO SPECIAL TYPE. 2 oclock ER/PR negative HER2 positive  . BREAST EXCISIONAL BIOPSY Right 07/23/2018   lumpectomy  . BREAST LUMPECTOMY WITH SENTINEL LYMPH NODE BIOPSY Right 07/23/2018   6 mm ER/PR negative, Her 2 neu 3+; node negative.  Surgeon: Robert Bellow, MD;  Location: [facility] ORS;  Service: General;  Laterality: Right;  . COLONOSCOPY  2007  . EYE SURGERY Bilateral    CATARACT EXTRACTIONS    Family History  Problem Relation Age of Onset  . Heart attack Mother   . Congestive Heart Failure Mother   . Stroke Father   . Colon cancer Sister 4  . Throat cancer Maternal Grandmother 37  . Healthy Daughter   . Breast cancer Neg Hx     Social History:  reports that she has never smoked. She has never used smokeless tobacco. She reports that she does not drink alcohol or use drugs.  Allergies: No Known Allergies  Medications reviewed.    ROS Full ROS performed and is otherwise negative other than what is stated in HPI   BP (!) 149/80   Pulse 71   Temp (!) 96.1 F (35.6 C) (Temporal)   Ht '5\' 2"'$  (1.575 m)   Wt 178 lb (80.7 kg)   SpO2 96%   BMI 32.56 kg/m   Physical Exam NAD,  awake and alert. Very strong for an octogenarian. Neck: supple, no masses, nml thyroid. No JVD BREAST: Right lumpectomy scar, no masses no DC. Left breast Nml. KWI:OXBD, nt, no masses Neuro: alert, gcs 15 no motor or sens deficits     Assessment/Plan:  1. Malignant neoplasm of upper-inner quadrant of right breast in female, estrogen receptor negative ([place]) Pt declined radiation and chemo. Given that this will increase the Risk of recurrence  I advice for close f/u. We will get mammo and PE in 6 months. Pt wishes to follow up w Dr. Bary Castilla at that time.    Greater than 50% of the 25 minutes  visit was spent in counseling/coordination of care   Caroleen Hamman, MD [place] Surgeon

## 2019-02-21 ENCOUNTER — Other Ambulatory Visit: Payer: Self-pay | Admitting: Family Medicine

## 2019-02-22 MED ORDER — AMLODIPINE BESYLATE 10 MG PO TABS: 10.0000 mg | ORAL_TABLET | Freq: Every day | ORAL | 0 refills | Status: DC

## 2019-02-22 NOTE — Telephone Encounter (Signed)
Please review for Dr. Gilbert  Thanks,   -Roxine Whittinghill  

## 2019-03-26 ENCOUNTER — Ambulatory Visit: Payer: Self-pay | Admitting: Family Medicine

## 2019-05-26 ENCOUNTER — Other Ambulatory Visit: Payer: Self-pay | Admitting: Physician Assistant

## 2019-06-12 DIAGNOSIS — H40153 Residual stage of open-angle glaucoma, bilateral: Secondary | ICD-10-CM | POA: Diagnosis not present

## 2019-06-25 ENCOUNTER — Other Ambulatory Visit: Payer: Self-pay | Admitting: Family Medicine

## 2019-07-08 ENCOUNTER — Encounter: Payer: Self-pay | Admitting: General Surgery

## 2019-07-08 ENCOUNTER — Other Ambulatory Visit: Payer: Self-pay | Admitting: *Deleted

## 2019-07-08 DIAGNOSIS — C50211 Malignant neoplasm of upper-inner quadrant of right female breast: Secondary | ICD-10-CM

## 2019-07-12 IMAGING — MG MM DIGITAL DIAGNOSTIC UNILAT*R* W/ TOMO W/ CAD
6 series · 6 of 14 positions shown · non-contrast
Comparison: Previous exam(s).

CLINICAL DATA: Right slightly lower breast asymmetry seen on most
recent screening mammography.

EXAM:
2D DIGITAL DIAGNOSTIC RIGHT MAMMOGRAM WITH CAD AND ADJUNCT TOMO
ULTRASOUND RIGHT BREAST

[R CC synth-2D]
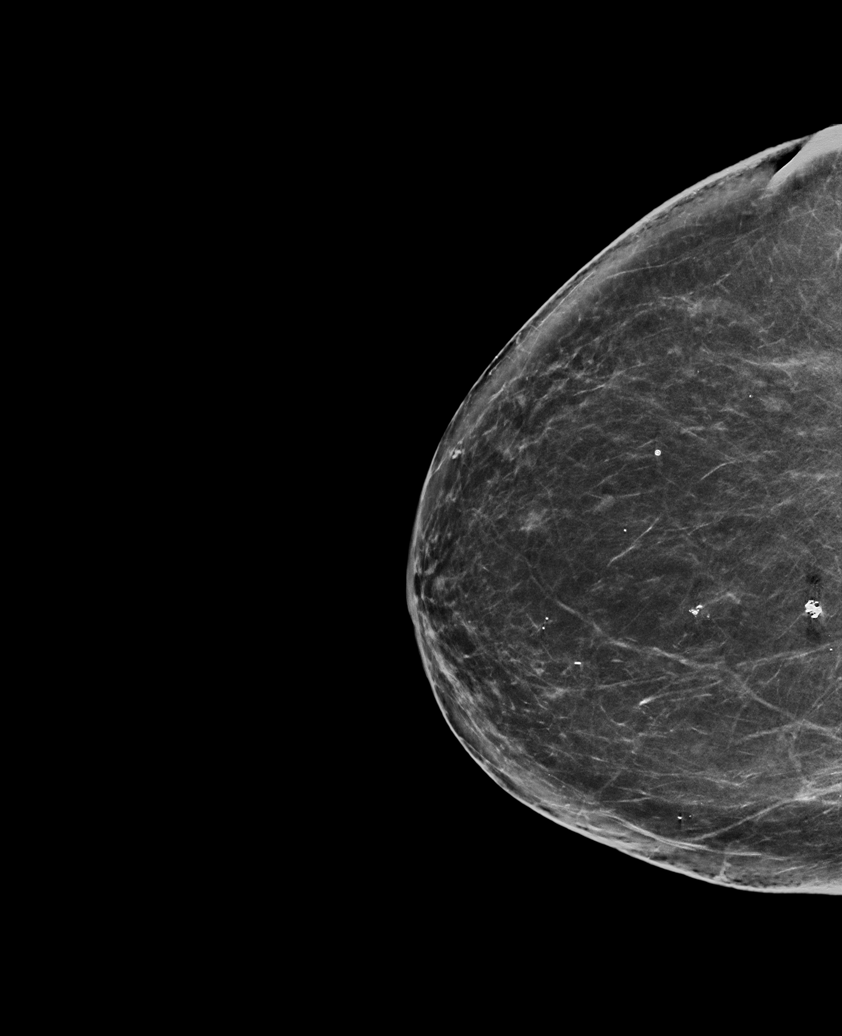

[R MLO synth-2D]
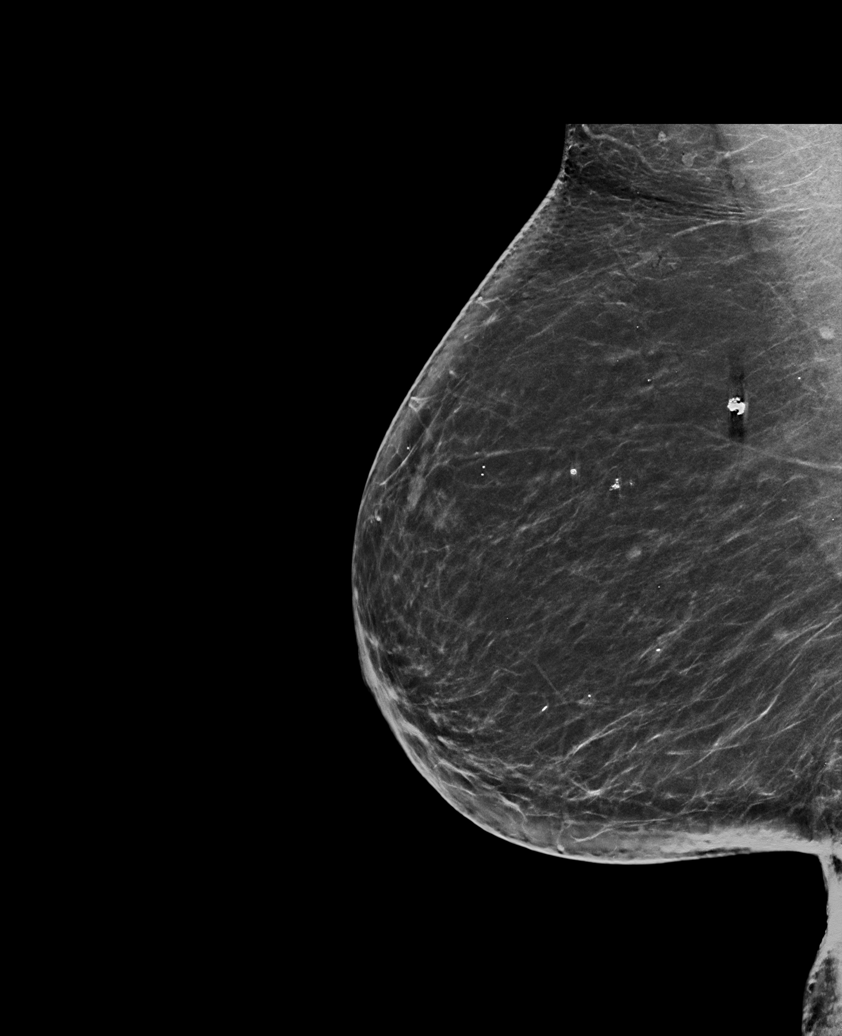

[R CC]
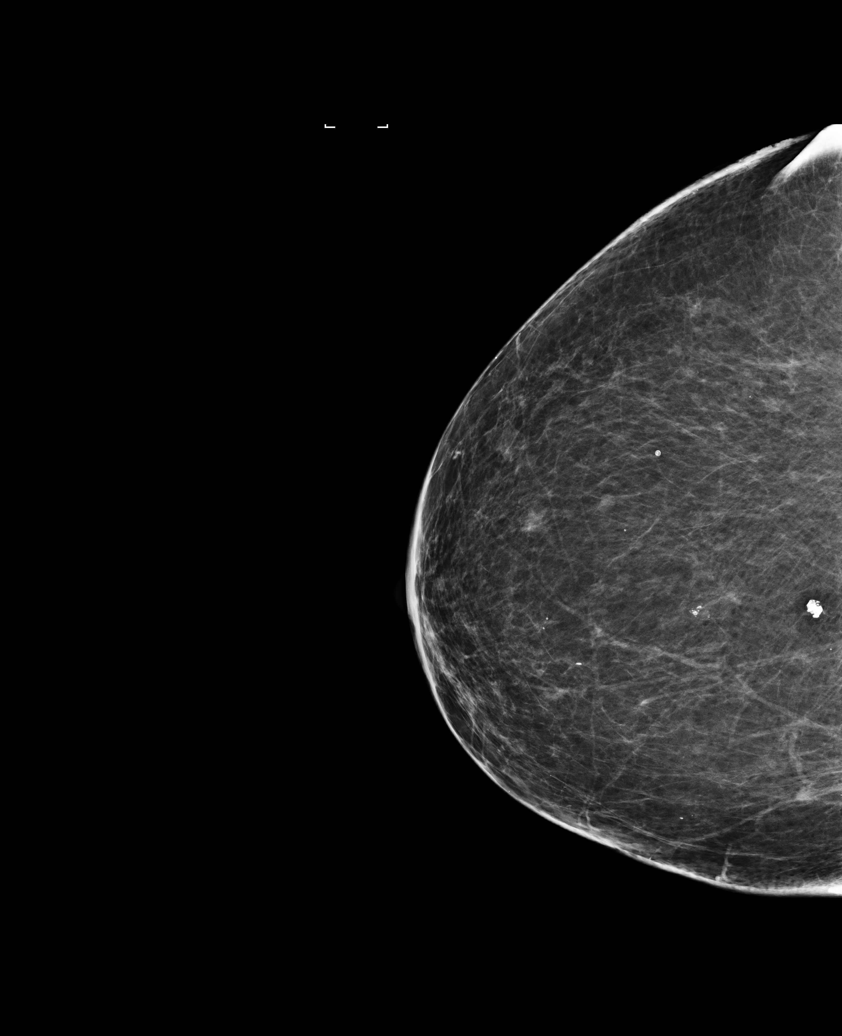

[R MLO]
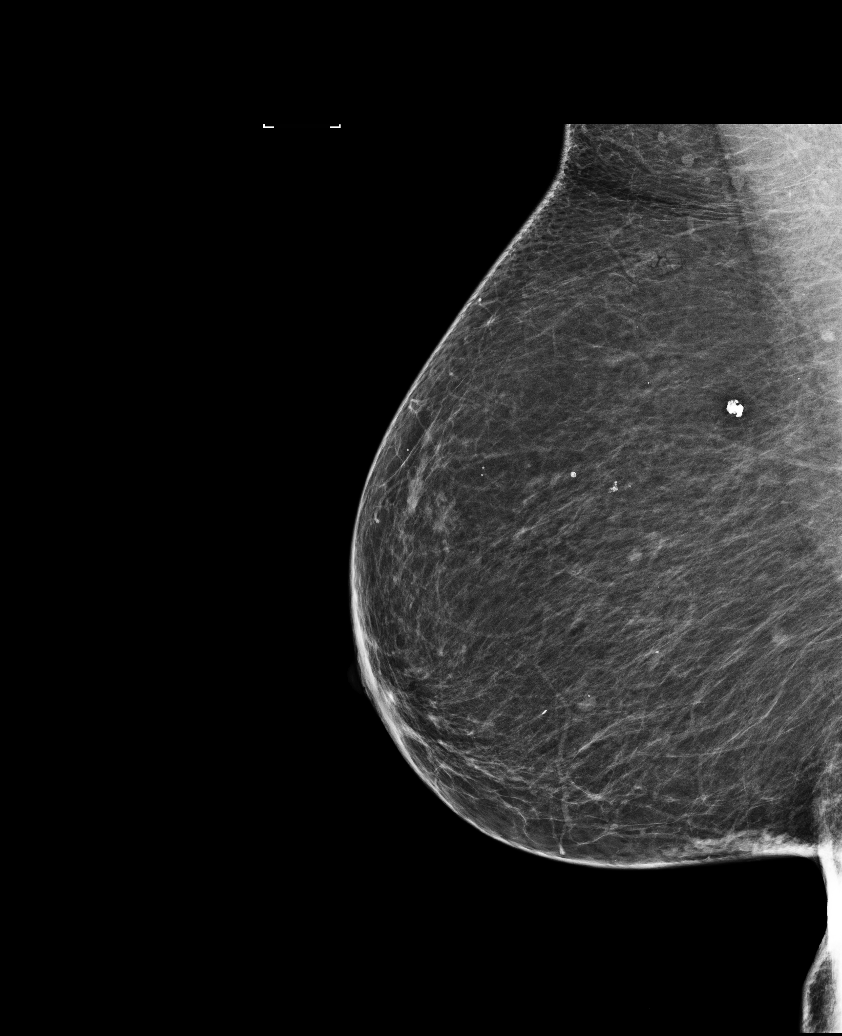

[R CC tomo · tomo slice 38/75.0]
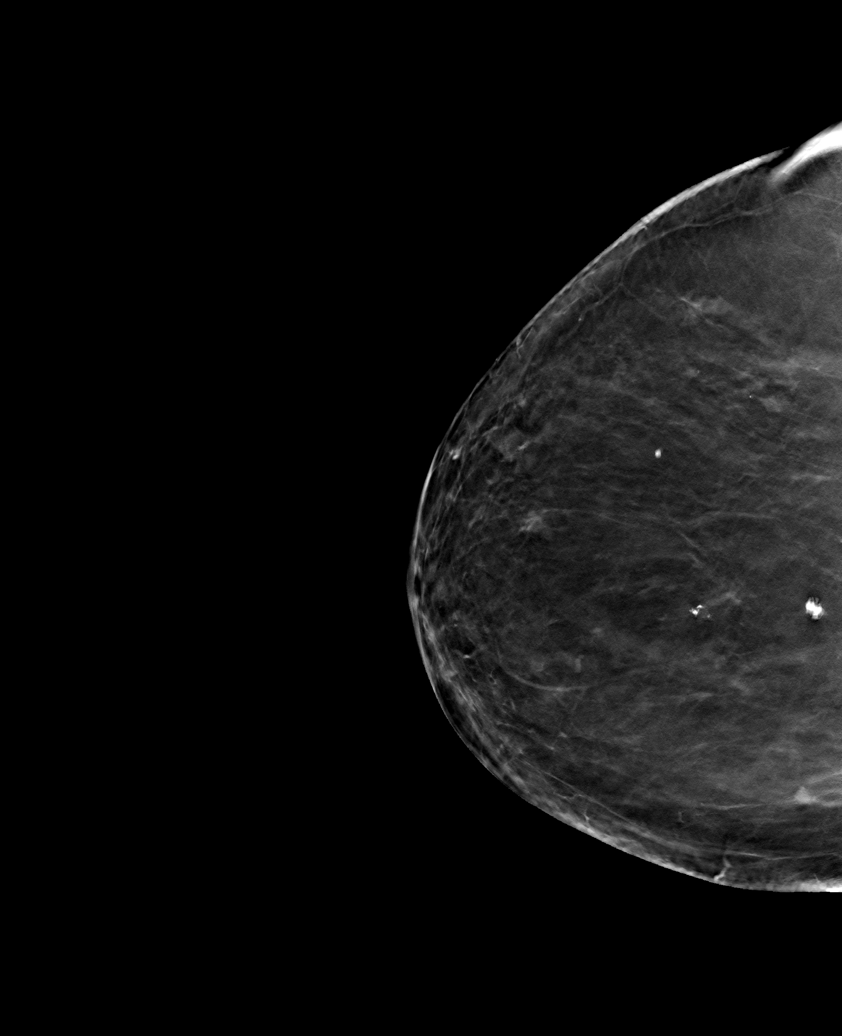

[R MLO tomo · tomo slice 39/76.0]
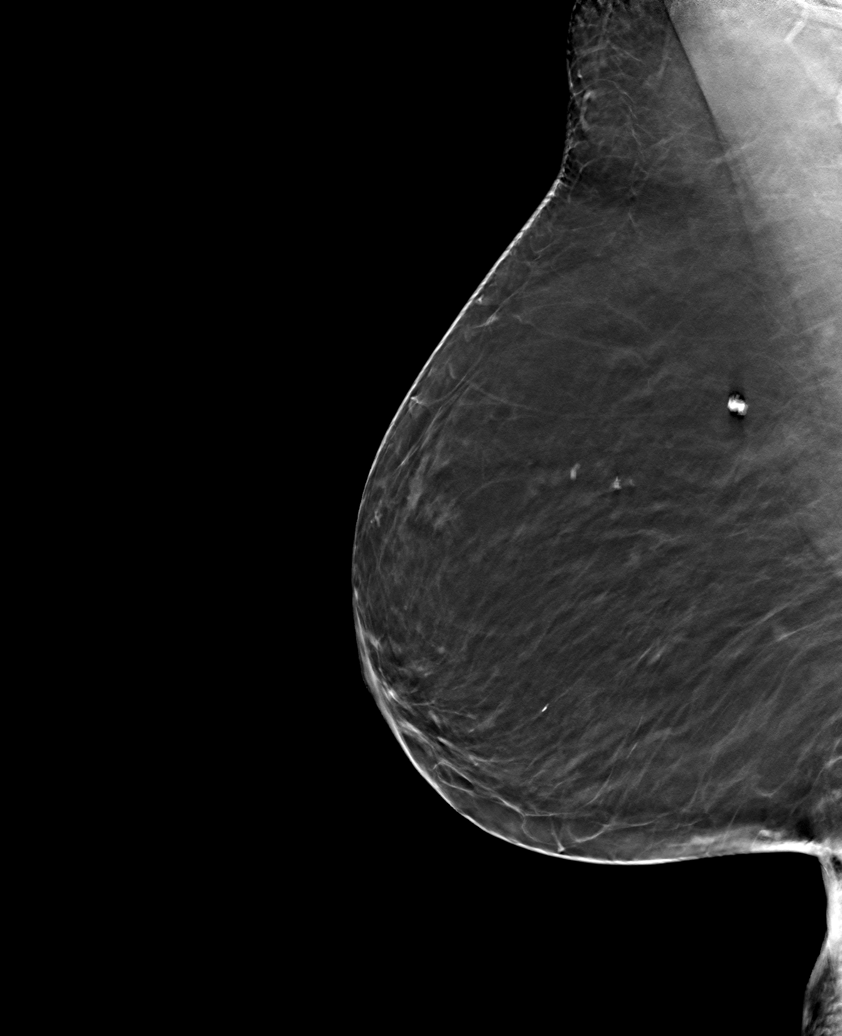

[6 of 14 positions shown; findings below may reference images not displayed]

ACR Breast Density Category b: There are scattered areas of
fibroglandular density.
FINDINGS: The additional mammographic views demonstrate a 4 mm nodule in the
slightly lower inner right breast, posterior depth.

Mammographic images were processed with CAD.

On physical exam, no suspicious masses are palpated.

Targeted ultrasound is performed, showing no sonographic correlation
to the 4 mm nodule seen mammographically.
IMPRESSION: Right breast slightly lower inner quadrant, posterior depth, 4 mm
mammographically apparent nodule, without sonographic correlation.
The options of imaging follow-up versus stereotactic core needle
biopsy were discussed with the patient. The patient would prefer a
short-term imaging follow-up.

RECOMMENDATION:
Diagnostic mammogram and possibly ultrasound of the right breast in
6 months. (Code:[id number]-DJ3)

I have discussed the findings and recommendations with the patient.
Results were also provided in writing at the conclusion of the
visit. If applicable, a reminder letter will be sent to the patient
regarding the next appointment.

BI-RADS CATEGORY  3: Probably benign.

## 2019-07-23 ENCOUNTER — Other Ambulatory Visit: Payer: Self-pay

## 2019-07-23 DIAGNOSIS — C50211 Malignant neoplasm of upper-inner quadrant of right female breast: Secondary | ICD-10-CM

## 2019-07-23 DIAGNOSIS — Z171 Estrogen receptor negative status [ER-]: Secondary | ICD-10-CM

## 2019-08-14 ENCOUNTER — Ambulatory Visit
Admission: RE | Admit: 2019-08-14 | Discharge: 2019-08-14 | Disposition: A | Discharge status: Home / Self Care | Payer: PPO | Source: Ambulatory Visit | Attending: Surgery | Admitting: Surgery

## 2019-08-14 DIAGNOSIS — N6001 Solitary cyst of right breast: Secondary | ICD-10-CM | POA: Diagnosis not present

## 2019-08-14 DIAGNOSIS — Z171 Estrogen receptor negative status [ER-]: Secondary | ICD-10-CM | POA: Diagnosis not present

## 2019-08-14 DIAGNOSIS — C50211 Malignant neoplasm of upper-inner quadrant of right female breast: Secondary | ICD-10-CM | POA: Insufficient documentation

## 2019-08-14 DIAGNOSIS — R928 Other abnormal and inconclusive findings on diagnostic imaging of breast: Secondary | ICD-10-CM | POA: Diagnosis not present

## 2019-08-19 ENCOUNTER — Ambulatory Visit (INDEPENDENT_AMBULATORY_CARE_PROVIDER_SITE_OTHER): Payer: PPO | Admitting: Surgery

## 2019-08-19 ENCOUNTER — Other Ambulatory Visit: Payer: Self-pay

## 2019-08-19 ENCOUNTER — Encounter: Payer: Self-pay | Admitting: Surgery

## 2019-08-19 VITALS — BP 158/75 | HR 79 | Temp 97.0°F | Ht 64.0 in | Wt 183.0 lb

## 2019-08-19 DIAGNOSIS — Z171 Estrogen receptor negative status [ER-]: Secondary | ICD-10-CM | POA: Diagnosis not present

## 2019-08-19 DIAGNOSIS — C50211 Malignant neoplasm of upper-inner quadrant of right female breast: Secondary | ICD-10-CM

## 2019-08-19 NOTE — Patient Instructions (Addendum)
The patient has been asked to return to the office in six months with a bilateral  breast diagnostic mammogram.

## 2019-08-20 NOTE — Progress Notes (Signed)
Outpatient Surgical Follow Up  08/20/2019  Kelly Clark is an 85 y.o. female.   Chief Complaint  Patient presents with  . Follow-up    mammogram     HPI: s/p lumpectomy R for invasive Ca. Neg nodes 07/2018. She declined radiation and chemo. She had a 6 month mammo f/u that I have pers. Reviewed showing no evidence of recurrence. Benign cyst and lumpectomy changes She denies any DC , pain  Or other issues. No weight loss  Past Medical History:  Diagnosis Date  . Basal cell carcinoma 2019   RIGHT THUMB REGION  . Breast cancer ([place]) 06/27/2018   RIGHT  . Cancer ([place]) 1977   melanoma . SURGERY RIGHT ARM  . Hypertension     Past Surgical History:  Procedure Laterality Date  . ABDOMINAL HYSTERECTOMY  1977  . BREAST BIOPSY Bilateral 1977   benign  . BREAST BIOPSY Right 06/27/2018   INVASIVE MAMMARY CARCINOMA, NO SPECIAL TYPE. 2 oclock ER/PR negative HER2 positive  . BREAST EXCISIONAL BIOPSY Right 07/23/2018   lumpectomy  . BREAST LUMPECTOMY WITH SENTINEL LYMPH NODE BIOPSY Right 07/23/2018   6 mm ER/PR negative, Her 2 neu 3+; node negative.  Surgeon: Robert Bellow, MD;  Location: [facility] ORS;  Service: General;  Laterality: Right;  . COLONOSCOPY  2007  . EYE SURGERY Bilateral    CATARACT EXTRACTIONS    Family History  Problem Relation Age of Onset  . Heart attack Mother   . Congestive Heart Failure Mother   . Stroke Father   . Colon cancer Sister 51  . Throat cancer Maternal Grandmother 11  . Healthy Daughter   . Breast cancer Neg Hx     Social History:  reports that she has never smoked. She has never used smokeless tobacco. She reports that she does not drink alcohol or use drugs.  Allergies: No Known Allergies  Medications reviewed.    ROS Full ROS performed and is otherwise negative other than what is stated in HPI   BP (!) 158/75   Pulse 79   Temp (!) 97 F (36.1 C) (Skin)   Ht '5\' 4"'$  (1.626 m)   Wt 183 lb (83 kg)   SpO2 97%   BMI 31.41 kg/m    Physical Exam Vitals signs and nursing note reviewed. Exam conducted with a chaperone present.  Constitutional:      General: She is not in acute distress.    Appearance: Normal appearance. She is normal weight.  Eyes:     General:        Right eye: No discharge.        Left eye: No discharge.  Neck:     Musculoskeletal: Normal range of motion and neck supple. No neck rigidity or muscular tenderness.  Cardiovascular:     Rate and Rhythm: Normal rate.     Heart sounds: No murmur. No friction rub.  Pulmonary:     Effort: Pulmonary effort is normal. No respiratory distress.     Breath sounds: No stridor. No rhonchi.     Comments: BREAST: Lumpectomy scar Right breast, no evidence of masses or LAD. Abdominal:     General: Abdomen is flat. There is no distension.     Palpations: There is no mass.     Tenderness: There is no abdominal tenderness. There is no guarding.  Skin:    Capillary Refill: Capillary refill takes less than 2 seconds.  Neurological:     General: No focal deficit present.  Mental Status: She is alert and oriented to person, place, and time.  Psychiatric:        Mood and Affect: Mood normal.        Behavior: Behavior normal.        Thought Content: Thought content normal.        Judgment: Judgment normal.     Assessment/Plan:  Doing well mammo and U/S 12/2019   Greater than 50% of the 25 minutes  visit was spent in counseling/coordination of care   Caroleen Hamman, MD [place] Surgeon

## 2019-08-23 ENCOUNTER — Other Ambulatory Visit: Payer: Self-pay | Admitting: Family Medicine

## 2019-08-27 ENCOUNTER — Other Ambulatory Visit: Payer: Self-pay | Admitting: Family Medicine

## 2019-09-17 DIAGNOSIS — L900 Lichen sclerosus et atrophicus: Secondary | ICD-10-CM | POA: Diagnosis not present

## 2019-09-19 NOTE — Progress Notes (Signed)
Subjective:   Kelly Clark is a 85 y.o. female who presents for Medicare Annual (Subsequent) preventive examination.    This visit is being conducted through telemedicine due to the COVID-19 pandemic. This patient has given me verbal consent via doximity to conduct this visit, patient states they are participating from their home address. Some vital signs may be absent or patient reported.    Patient identification: identified by name, DOB, and current address  Review of Systems:  N/A  Cardiac Risk Factors include: advanced age (>83mn, >>47women);hypertension     Objective:     Vitals: There were no vitals taken for this visit.  There is no height or weight on file to calculate BMI. Unable to obtain vitals due to visit being conducted via telephonically.   Advanced Directives 09/23/2019 11/02/2018 09/19/2018 07/23/2018 07/17/2018 07/09/2018 07/06/2017  Does Patient Have a Medical Advance Directive? Yes Yes Yes Yes Yes Yes Yes  Type of AParamedicof AHunnewellLiving will - H[place]emLiving will HGerm[place]Living will HReedsburgLiving will HSlovanLiving will HHickmanLiving will  Does patient want to make changes to medical advance directive? - - - No - Patient declined No - Patient declined - -  Copy of H[place] Hopein Chart? Yes - validated most recent copy scanned in chart (See row information) - No - copy requested Yes Yes - Yes    Tobacco Social History   Tobacco Use  Smoking Status Never Smoker  Smokeless Tobacco Never Used     Counseling given: Not Answered   Clinical Intake:  Pre-visit preparation completed: Yes  Pain : No/denies pain Pain Score: 0-No pain     Nutritional Risks: None Diabetes: No  How often do you need to have someone help you when you read instructions, pamphlets, or other written materials from your doctor or  pharmacy?: 1 - Never  Interpreter Needed?: No  Information [facility] :: [facility]  Past Medical History:  Diagnosis Date  . Basal cell carcinoma 2019   RIGHT THUMB REGION  . Breast cancer ([place] 06/27/2018   RIGHT  . [place]   melanoma . SURGERY RIGHT ARM  . Hypertension    Past Surgical History:  Procedure Laterality Date  . ABDOMINAL HYSTERECTOMY  1977  . BREAST BIOPSY Bilateral 1977   benign  . BREAST BIOPSY Right 06/27/2018   INVASIVE MAMMARY CARCINOMA, NO SPECIAL TYPE. 2 oclock ER/PR negative HER2 positive  . BREAST EXCISIONAL BIOPSY Right 07/23/2018   lumpectomy  . BREAST LUMPECTOMY WITH SENTINEL LYMPH NODE BIOPSY Right 07/23/2018   6 mm ER/PR negative, Her 2 neu 3+; node negative.  Surgeon: BRobert Bellow MD;  Location: [facility] ORS;  Service: General;  Laterality: Right;  . COLONOSCOPY  2007  . EYE SURGERY Bilateral    CATARACT EXTRACTIONS   Family History  Problem Relation Age of Onset  . Heart attack Mother   . Congestive Heart Failure Mother   . Stroke Father   . Colon cancer Sister 858 . Throat cancer Maternal Grandmother 760 . Healthy Daughter   . Breast cancer Neg Hx    Social History   Socioeconomic History  . Marital status: Widowed    Spouse name: Not on file  . Number of children: 1  . Years of education: H/S  . Highest education level: Some college, no degree  Occupational History  . Occupation: Retired  Social Needs  . Financial resource strain: Not hard at all  . Food insecurity    Worry: Never true    Inability: Never true  . Transportation needs    Medical: No    Non-medical: No  Tobacco Use  . Smoking status: Never Smoker  . Smokeless tobacco: Never Used  Substance and Sexual Activity  . Alcohol use: No  . Drug use: No  . Sexual activity: Not Currently  Lifestyle  . Physical activity    Days per week: 0 days    Minutes per session: 0 min  . Stress: Not at all  Relationships  . Social Herbalist on  phone: Patient refused    Gets together: Patient refused    Attends religious service: Patient refused    Active member of club or organization: Patient refused    Attends meetings of clubs or organizations: Patient refused    Relationship status: Patient refused  Other Topics Concern  . Not on file  Social History Narrative  . Not on file    Outpatient Encounter Medications as of 09/23/2019  Medication Sig  . amLODipine (NORVASC) 10 MG tablet TAKE 1 TABLET(10 MG) BY MOUTH DAILY  . Cholecalciferol (VITAMIN D3) 10000 units TABS Take 10,000 Units by mouth at bedtime.  . clobetasol ointment (TEMOVATE) 6.76 % Apply 1 application topically once a week. Applied to affected area of vagina and rectum   . gabapentin (NEURONTIN) 600 MG tablet TAKE 1 TABLET BY MOUTH AT BEDTIME  . Ginkgo Biloba 40 MG TABS Take 1 tablet by mouth daily. @@ noon  . hydrochlorothiazide (HYDRODIURIL) 12.5 MG tablet TAKE 1 TABLET BY MOUTH ONCE DAILY  . latanoprost (XALATAN) 0.005 % ophthalmic solution Place 1 drop into both eyes at bedtime.   . Multiple Vitamin (MULTIVITAMIN) tablet Take 1 tablet by mouth daily.  . multivitamin-lutein (OCUVITE-LUTEIN) CAPS capsule Take 1 capsule by mouth every morning.   . polyethylene glycol powder (MIRALAX) powder Take 17 g by mouth at bedtime.   Marland Kitchen RESVERATROL-GRAPE PO Take 1 capsule by mouth daily. CoQ10 Resveratrol & Grape Seed  . PROBIOTIC PRODUCT PO Take 1 capsule by mouth daily with lunch.    No facility-administered encounter medications on file as of 09/23/2019.     Activities of Daily Living In your present state of health, do you have any difficulty performing the following activities: 09/23/2019  Hearing? N  Vision? N  Difficulty concentrating or making decisions? N  Walking or climbing stairs? N  Dressing or bathing? N  Doing errands, shopping? N  Preparing Food and eating ? N  Using the Toilet? N  In the past six months, have you accidently leaked urine? Y  Comment  Occasionally with standing up. Advised pt to try kegal exercises.  Do you have problems with loss of bowel control? N  Managing your Medications? N  Managing your Finances? N  Housekeeping or managing your Housekeeping? N  Some recent data might be hidden    Patient Care Team: [name]., MD as PCP - General (Family Medicine) Lorelee Cover., MD as Consulting Physician (Ophthalmology) Oneta Rack, MD as Consulting Physician (Dermatology) Jules Husbands, MD as Consulting Physician (General Surgery)    Assessment:   This is a routine wellness examination for [place].  Exercise Activities and Dietary recommendations Current Exercise Habits: The patient does not participate in regular exercise at present, Exercise limited by: None identified  Goals    . Exercise 150  minutes per week (moderate activity)    . Increase water intake     Recommend increasing water intake to 4-6 glasses a day.        Fall Risk: Fall Risk  09/23/2019 02/06/2019 09/19/2018 07/06/2017 10/05/2016  Falls in the past year? 0 0 No No Yes  Number falls in past yr: 0 0 - - 1  Injury with Fall? 0 0 - - Yes    FALL RISK PREVENTION PERTAINING TO THE HOME:  Any stairs in or around the home? Yes  If so, are there any without handrails? No   Home free of loose throw rugs in walkways, pet beds, electrical cords, etc? Yes  Adequate lighting in your home to reduce risk of falls? Yes   ASSISTIVE DEVICES UTILIZED TO PREVENT FALLS:  Life alert? No  Use of a cane, walker or w/c? No  Grab bars in the bathroom? Yes  Shower chair or bench in shower? No  Elevated toilet seat or a handicapped toilet? No    TIMED UP AND GO:  Was the test performed? No .    Depression Screen PHQ 2/9 Scores 09/23/2019 09/19/2018 09/19/2018 07/06/2017  PHQ - 2 Score 0 0 0 0  PHQ- 9 Score - 0 - -     Cognitive Function     6CIT Screen 09/23/2019 07/06/2017 10/05/2016  What Year? 0 points 0 points 0 points  What  month? 0 points 0 points 0 points  What time? 0 points 0 points 0 points  Count back from 20 0 points 0 points 0 points  Months in reverse 0 points 0 points 0 points  Repeat phrase 0 points 0 points 0 points  Total Score 0 0 0    Immunization History  Administered Date(s) Administered  . Influenza, High Dose Seasonal PF 09/23/2015, 10/05/2016, 11/01/2017, 09/19/2018  . Pneumococcal Conjugate-13 08/01/2014  . Pneumococcal Polysaccharide-23 06/03/1999  . Td 01/18/2007    Qualifies for Shingles Vaccine? . Due for Shingrix. Pt has been advised to call insurance company to determine out of pocket expense. Advised may also receive vaccine at local pharmacy or Health Dept. Verbalized acceptance and understanding.  Tdap: Although this vaccine is not a covered service during a Wellness Exam, does the patient still wish to receive this vaccine today?  No .   Flu Vaccine: Due for Flu vaccine. Does the patient want to receive this vaccine today?  No .   Pneumococcal Vaccine: Completed series  Screening Tests Health Maintenance  Topic Date Due  . INFLUENZA VACCINE  07/20/2019  . TETANUS/TDAP  12/19/2026 (Originally 01/18/2017)  . DEXA SCAN  Completed  . PNA vac Low Risk Adult  Completed    Cancer Screenings:  Colorectal Screening: No longer required.   Mammogram: No longer required.   Bone Density: Completed 10/06/10. Results reflect NORMAL. No repeat needed unless advised by a physician.   Lung Cancer Screening: (Low Dose CT Chest recommended if Age 33-80 years, 30 pack-year currently smoking OR have quit w/in 15years.) does not qualify.   Additional Screening:   Vision Screening: Recommended annual ophthalmology exams for early detection of glaucoma and other disorders of the eye.  Dental Screening: Recommended annual dental exams for proper oral hygiene  Community Resource Referral:  CRR required this visit?  No       Plan:  I have personally reviewed and addressed the  Medicare Annual Wellness questionnaire and have noted the following in the patient's chart:  A. Medical and social history  B. Use of alcohol, tobacco or illicit drugs  C. Current medications and supplements D. Functional ability and status E.  Nutritional status F.  Physical activity G. Advance directives H. List of other physicians I.  Hospitalizations, surgeries, and ER visits in previous 12 months J.  [place] such as hearing and vision if needed, cognitive and depression L. Referrals and appointments   In addition, I have reviewed and discussed with patient certain preventive protocols, quality metrics, and best practice recommendations. A written personalized care plan for preventive services as well as general preventive health recommendations were provided to patient. Nurse Health Advisor  Signed,    Kentrell Hallahan [place], [place]  [id number]/[id number] Nurse Health Advisor   Nurse Notes: Pt would like to receive a flu shot at [facility].

## 2019-09-23 ENCOUNTER — Other Ambulatory Visit: Payer: Self-pay

## 2019-09-23 ENCOUNTER — Ambulatory Visit (INDEPENDENT_AMBULATORY_CARE_PROVIDER_SITE_OTHER): Payer: PPO

## 2019-09-23 DIAGNOSIS — Z0000 Encounter for general adult medical examination without abnormal findings: Secondary | ICD-10-CM

## 2019-09-23 NOTE — Patient Instructions (Signed)
Kelly Clark , Thank you for taking time to come for your Medicare Wellness Visit. I appreciate your ongoing commitment to your health goals. Please review the following plan we discussed and let me know if I can assist you in the future.   Screening recommendations/referrals: Colonoscopy: No longer required.  Mammogram: No longer required.  Bone Density: Up to date. Previous DEXA was normal. No repeat needed unless indicated by physician.  Recommended yearly ophthalmology/optometry visit for glaucoma screening and checkup Recommended yearly dental visit for hygiene and checkup  Vaccinations: Influenza vaccine: Currently due Pneumococcal vaccine: Completed series Tdap vaccine: Pt declines today.  Shingles vaccine: Pt declines today.     Advanced directives: Currently on file.   Conditions/risks identified: Recommend to start back at the gym or walking 3 days a week for at least 30 minutes at a time.   Next appointment: 09/24/19 @ 10:20 AM with Dr Rosanna Randy.    Preventive Care 69 Years and Older, Female Preventive care refers to lifestyle choices and visits with your health care provider that can promote health and wellness. What does preventive care include?  A yearly physical exam. This is also called an annual well check.  Dental exams once or twice a year.  Routine eye exams. Ask your health care provider how often you should have your eyes checked.  Personal lifestyle choices, including:  Daily care of your teeth and gums.  Regular physical activity.  Eating a healthy diet.  Avoiding tobacco and drug use.  Limiting alcohol use.  Practicing safe sex.  Taking low-dose aspirin every day.  Taking vitamin and mineral supplements as recommended by your health care provider. What happens during an annual well check? The services and screenings done by your health care provider during your annual well check will depend on your age, overall health, lifestyle risk factors, and  family history of disease. Counseling  Your health care provider may ask you questions about your:  Alcohol use.  Tobacco use.  Drug use.  Emotional well-being.  Home and relationship well-being.  Sexual activity.  Eating habits.  History of falls.  Memory and ability to understand (cognition).  Work and work Statistician.  Reproductive health. Screening  You may have the following tests or measurements:  Height, weight, and BMI.  Blood pressure.  Lipid and cholesterol levels. These may be checked every 5 years, or more frequently if you are over 41 years old.  Skin check.  Lung cancer screening. You may have this screening every year starting at age 28 if you have a 30-pack-year history of smoking and currently smoke or have quit within the past 15 years.  Fecal occult blood test (FOBT) of the stool. You may have this test every year starting at age 55.  Flexible sigmoidoscopy or colonoscopy. You may have a sigmoidoscopy every 5 years or a colonoscopy every 10 years starting at age 66.  Hepatitis C blood test.  Hepatitis B blood test.  Sexually transmitted disease (STD) testing.  Diabetes screening. This is done by checking your blood sugar (glucose) after you have not eaten for a while (fasting). You may have this done every 1-3 years.  Bone density scan. This is done to screen for osteoporosis. You may have this done starting at age 16.  Mammogram. This may be done every 1-2 years. Talk to your health care provider about how often you should have regular mammograms. Talk with your health care provider about your test results, treatment options, and if necessary, the  need for more tests. Vaccines  Your health care provider may recommend certain vaccines, such as:  Influenza vaccine. This is recommended every year.  Tetanus, diphtheria, and acellular pertussis (Tdap, Td) vaccine. You may need a Td booster every 10 years.  Zoster vaccine. You may need this  after age 47.  Pneumococcal 13-valent conjugate (PCV13) vaccine. One dose is recommended after age 48.  Pneumococcal polysaccharide (PPSV23) vaccine. One dose is recommended after age 23. Talk to your health care provider about which screenings and vaccines you need and how often you need them. This information is not intended to replace advice given to you by your health care provider. Make sure you discuss any questions you have with your health care provider. Document Released: 01/01/2016 Document Revised: 08/24/2016 Document Reviewed: 10/06/2015 Elsevier Interactive Patient Education  2017 [place] Prevention in the Home Falls can cause injuries. They can happen to people of all ages. There are many things you can do to make your home safe and to help prevent falls. What can I do on the outside of my home?  Regularly fix the edges of walkways and driveways and fix any cracks.  Remove anything that might make you trip as you walk through a door, such as a raised step or threshold.  Trim any bushes or trees on the path to your home.  Use bright outdoor lighting.  Clear any walking paths of anything that might make someone trip, such as rocks or tools.  Regularly check to see if handrails are loose or broken. Make sure that both sides of any steps have handrails.  Any raised decks and porches should have guardrails on the edges.  Have any leaves, snow, or ice cleared regularly.  Use sand or salt on walking paths during winter.  Clean up any spills in your garage right away. This includes oil or grease spills. What can I do in the bathroom?  Use night lights.  Install grab bars by the toilet and in the tub and shower. Do not use towel bars as grab bars.  Use non-skid mats or decals in the tub or shower.  If you need to sit down in the shower, use a plastic, non-slip stool.  Keep the floor dry. Clean up any water that spills on the floor as soon as it happens.   Remove soap buildup in the tub or shower regularly.  Attach bath mats securely with double-sided non-slip rug tape.  Do not have throw rugs and other things on the floor that can make you trip. What can I do in the bedroom?  Use night lights.  Make sure that you have a light by your bed that is easy to reach.  Do not use any sheets or blankets that are too big for your bed. They should not hang down onto the floor.  Have a firm chair that has side arms. You can use this for support while you get dressed.  Do not have throw rugs and other things on the floor that can make you trip. What can I do in the kitchen?  Clean up any spills right away.  Avoid walking on wet floors.  Keep items that you use a lot in easy-to-reach places.  If you need to reach something above you, use a strong step stool that has a grab bar.  Keep electrical cords out of the way.  Do not use floor polish or wax that makes floors slippery. If you must use wax,  use non-skid floor wax.  Do not have throw rugs and other things on the floor that can make you trip. What can I do with my stairs?  Do not leave any items on the stairs.  Make sure that there are handrails on both sides of the stairs and use them. Fix handrails that are broken or loose. Make sure that handrails are as [place] as the stairways.  Check any carpeting to make sure that it is firmly attached to the stairs. Fix any carpet that is loose or worn.  Avoid having throw rugs at the top or bottom of the stairs. If you do have throw rugs, attach them to the floor with carpet tape.  Make sure that you have a light switch at the top of the stairs and the bottom of the stairs. If you do not have them, ask someone to add them for you. What else can I do to help prevent falls?  Wear shoes that:  Do not have high heels.  Have rubber bottoms.  Are comfortable and fit you well.  Are closed at the toe. Do not wear sandals.  If you use a  stepladder:  Make sure that it is fully opened. Do not climb a closed stepladder.  Make sure that both sides of the stepladder are locked into place.  Ask someone to hold it for you, if possible.  Clearly mark and make sure that you can see:  Any grab bars or handrails.  First and last steps.  Where the edge of each step is.  Use tools that help you move around (mobility aids) if they are needed. These include:  Canes.  Walkers.  Scooters.  Crutches.  Turn on the lights when you go into a dark area. Replace any light bulbs as soon as they burn out.  Set up your furniture so you have a clear path. Avoid moving your furniture around.  If any of your floors are uneven, fix them.  If there are any pets around you, be aware of where they are.  Review your medicines with your doctor. Some medicines can make you feel dizzy. This can increase your chance of falling. Ask your doctor what other things that you can do to help prevent falls. This information is not intended to replace advice given to you by your health care provider. Make sure you discuss any questions you have with your health care provider. Document Released: 10/01/2009 Document Revised: 05/12/2016 Document Reviewed: 01/09/2015 Elsevier Interactive Patient Education  2017 [employer].

## 2019-09-24 ENCOUNTER — Ambulatory Visit: Payer: PPO

## 2019-09-24 ENCOUNTER — Other Ambulatory Visit: Payer: Self-pay

## 2019-09-24 ENCOUNTER — Encounter: Payer: Self-pay | Admitting: Family Medicine

## 2019-09-24 ENCOUNTER — Ambulatory Visit (INDEPENDENT_AMBULATORY_CARE_PROVIDER_SITE_OTHER): Payer: PPO | Admitting: Family Medicine

## 2019-09-24 VITALS — BP 138/68 | HR 71 | Temp 97.8°F | Resp 16 | Ht 64.0 in | Wt 183.0 lb

## 2019-09-24 DIAGNOSIS — I10 Essential (primary) hypertension: Secondary | ICD-10-CM

## 2019-09-24 DIAGNOSIS — C50211 Malignant neoplasm of upper-inner quadrant of right female breast: Secondary | ICD-10-CM | POA: Diagnosis not present

## 2019-09-24 DIAGNOSIS — Z23 Encounter for immunization: Secondary | ICD-10-CM

## 2019-09-24 DIAGNOSIS — R32 Unspecified urinary incontinence: Secondary | ICD-10-CM | POA: Diagnosis not present

## 2019-09-24 DIAGNOSIS — N390 Urinary tract infection, site not specified: Secondary | ICD-10-CM

## 2019-09-24 DIAGNOSIS — Z171 Estrogen receptor negative status [ER-]: Secondary | ICD-10-CM | POA: Diagnosis not present

## 2019-09-24 DIAGNOSIS — E781 Pure hyperglyceridemia: Secondary | ICD-10-CM | POA: Diagnosis not present

## 2019-09-24 DIAGNOSIS — Z0000 Encounter for general adult medical examination without abnormal findings: Secondary | ICD-10-CM | POA: Diagnosis not present

## 2019-09-24 NOTE — Patient Instructions (Addendum)
1. Encounter for annual physical exam  - CBC - TSH -METC  2. Enuresis  - urine culture and sensitivity   3. Need for influenza vaccination  - Fluad Quad(high Dose 65+)

## 2019-09-24 NOTE — Progress Notes (Signed)
Patient: Kelly Clark, Female    DOB: 1929-07-03, 85 y.o.   MRN: [id number] Visit Date: 09/24/2019  Today's Provider: Wilhemena Durie, MD   Chief Complaint  Patient presents with  . Annual Exam   Subjective:     Patient had AWV with [facility] 09/23/2019.   Complete Physical Kelly Clark is a 85 y.o. female. She feels fairly well. She reports exercising some. She reports she is sleeping well. Pt feels well. She only has c/o on enuresis. -----------------------------------------------------------   Review of Systems  Constitutional: Negative.   HENT: Negative.   Eyes: Negative.   Respiratory: Negative.   Cardiovascular: Negative.   Gastrointestinal: Negative.   Endocrine: Negative.   Genitourinary: Positive for enuresis.  Musculoskeletal: Positive for arthralgias.  Skin: Negative.   Allergic/Immunologic: Negative.   Neurological: Positive for light-headedness.  Hematological: Negative.   Psychiatric/Behavioral: Negative.   All other systems reviewed and are negative.   Social History   Socioeconomic History  . Marital status: Widowed    Spouse name: Not on file  . Number of children: 1  . Years of education: H/S  . Highest education level: Some college, no degree  Occupational History  . Occupation: Retired  Scientific laboratory technician  . Financial resource strain: Not hard at all  . Food insecurity    Worry: Never true    Inability: Never true  . Transportation needs    Medical: No    Non-medical: No  Tobacco Use  . Smoking status: Never Smoker  . Smokeless tobacco: Never Used  Substance and Sexual Activity  . Alcohol use: No  . Drug use: No  . Sexual activity: Not Currently  Lifestyle  . Physical activity    Days per week: 0 days    Minutes per session: 0 min  . Stress: Not at all  Relationships  . Social Herbalist on phone: Patient refused    Gets together: Patient refused    Attends religious service: Patient refused    Active member of  club or organization: Patient refused    Attends meetings of clubs or organizations: Patient refused    Relationship status: Patient refused  . Intimate partner violence    Fear of current or ex partner: Patient refused    Emotionally abused: Patient refused    Physically abused: Patient refused    Forced sexual activity: Patient refused  Other Topics Concern  . Not on file  Social History Narrative  . Not on file    Past Medical History:  Diagnosis Date  . Basal cell carcinoma 2019   RIGHT THUMB REGION  . Breast cancer ([place]) 06/27/2018   RIGHT  . Cancer ([place]) 1977   melanoma . SURGERY RIGHT ARM  . Hypertension      Patient Active Problem List   Diagnosis Date Noted  . Actinic keratosis 09/21/2018  . Malignant neoplasm of upper-inner quadrant of right breast in female, estrogen receptor negative ([place]) 07/11/2018  . Benign essential HTN 09/08/2017  . Adult BMI 30+ 07/06/2015  . Hemorrhoid 07/06/2015  . Neutropenia ([place]) 07/06/2015  . Disorder of peripheral nervous system 07/06/2015  . Avitaminosis D 07/06/2015  . Vaginal atrophy 07/06/2015  . Abnormal LFTs 08/21/2009  . Cannot sleep 05/23/2000  . Hypertriglyceridemia 05/19/2000    Past Surgical History:  Procedure Laterality Date  . ABDOMINAL HYSTERECTOMY  1977  . BREAST BIOPSY Bilateral 1977   benign  . BREAST BIOPSY Right 06/27/2018  INVASIVE MAMMARY CARCINOMA, NO SPECIAL TYPE. 2 oclock ER/PR negative HER2 positive  . BREAST EXCISIONAL BIOPSY Right 07/23/2018   lumpectomy  . BREAST LUMPECTOMY WITH SENTINEL LYMPH NODE BIOPSY Right 07/23/2018   6 mm ER/PR negative, Her 2 neu 3+; node negative.  Surgeon: Robert Bellow, MD;  Location: [facility] ORS;  Service: General;  Laterality: Right;  . COLONOSCOPY  2007  . EYE SURGERY Bilateral    CATARACT EXTRACTIONS    Her family history includes Colon cancer (age of onset: 12) in her sister; Congestive Heart Failure in her mother; Healthy in her daughter; Heart attack in  her mother; Stroke in her father; Throat cancer (age of onset: 55) in her maternal grandmother. There is no history of Breast cancer.   Current Outpatient Medications:  .  amLODipine (NORVASC) 10 MG tablet, TAKE 1 TABLET(10 MG) BY MOUTH DAILY, Disp: 90 tablet, Rfl: 3 .  Cholecalciferol (VITAMIN D3) 10000 units TABS, Take 10,000 Units by mouth at bedtime., Disp: , Rfl:  .  clobetasol ointment (TEMOVATE) 7.25 %, Apply 1 application topically once a week. Applied to affected area of vagina and rectum , Disp: , Rfl:  .  gabapentin (NEURONTIN) 600 MG tablet, TAKE 1 TABLET BY MOUTH AT BEDTIME, Disp: 90 tablet, Rfl: 3 .  Ginkgo Biloba 40 MG TABS, Take 1 tablet by mouth daily. @@ noon, Disp: , Rfl:  .  hydrochlorothiazide (HYDRODIURIL) 12.5 MG tablet, TAKE 1 TABLET BY MOUTH ONCE DAILY, Disp: 90 tablet, Rfl: 3 .  latanoprost (XALATAN) 0.005 % ophthalmic solution, Place 1 drop into both eyes at bedtime. , Disp: , Rfl: 0 .  Multiple Vitamin (MULTIVITAMIN) tablet, Take 1 tablet by mouth daily., Disp: , Rfl:  .  multivitamin-lutein (OCUVITE-LUTEIN) CAPS capsule, Take 1 capsule by mouth every morning. , Disp: , Rfl:  .  polyethylene glycol powder (MIRALAX) powder, Take 17 g by mouth at bedtime. , Disp: , Rfl:  .  PROBIOTIC PRODUCT PO, Take 1 capsule by mouth daily with lunch. , Disp: , Rfl:  .  RESVERATROL-GRAPE PO, Take 1 capsule by mouth daily. CoQ10 Resveratrol & Grape Seed, Disp: , Rfl:   Patient Care Team: [name]., MD as PCP - General (Family Medicine) Lorelee Cover., MD as Consulting Physician (Ophthalmology) Oneta Rack, MD as Consulting Physician (Dermatology) Jules Husbands, MD as Consulting Physician (General Surgery)     Objective:    Vitals: BP 138/68 (BP Location: Left Arm, Patient Position: Sitting, Cuff Size: Large)   Pulse 71   Temp 97.8 F (36.6 C) (Oral)   Resp 16   Ht '5\' 4"'$  (1.626 m)   Wt 183 lb (83 kg)   SpO2 98%   BMI 31.41 kg/m   Physical Exam  Vitals signs reviewed.  Constitutional:      Appearance: She is well-developed.  HENT:     Head: Normocephalic and atraumatic.     Right Ear: External ear normal.     Left Ear: External ear normal.     Nose: Nose normal.  Eyes:     General: No scleral icterus.    Conjunctiva/sclera: Conjunctivae normal.  Neck:     Thyroid: No thyromegaly.  Cardiovascular:     Rate and Rhythm: Normal rate and regular rhythm.     Heart sounds: Normal heart sounds.  Pulmonary:     Effort: Pulmonary effort is normal.     Breath sounds: Normal breath sounds.  Abdominal:     Palpations: Abdomen is soft.  Genitourinary:    Vagina: No vaginal discharge.  Musculoskeletal:     Comments: Trace pedal edema.  Skin:    General: Skin is warm and dry.  Neurological:     Mental Status: She is alert and oriented to person, place, and time.  Psychiatric:        Mood and Affect: Mood normal.        Behavior: Behavior normal.        Thought Content: Thought content normal.        Judgment: Judgment normal.     Activities of Daily Living In your present state of health, do you have any difficulty performing the following activities: 09/23/2019  Hearing? N  Vision? N  Difficulty concentrating or making decisions? N  Walking or climbing stairs? N  Dressing or bathing? N  Doing errands, shopping? N  Preparing Food and eating ? N  Using the Toilet? N  In the past six months, have you accidently leaked urine? Y  Comment Occasionally with standing up. Advised pt to try kegal exercises.  Do you have problems with loss of bowel control? N  Managing your Medications? N  Managing your Finances? N  Housekeeping or managing your Housekeeping? N  Some recent data might be hidden    Fall Risk Assessment Fall Risk  09/23/2019 02/06/2019 09/19/2018 07/06/2017 10/05/2016  Falls in the past year? 0 0 No No Yes  Number falls in past yr: 0 0 - - 1  Injury with Fall? 0 0 - - Yes     Depression Screen PHQ 2/9 Scores  09/23/2019 09/19/2018 09/19/2018 07/06/2017  PHQ - 2 Score 0 0 0 0  PHQ- 9 Score - 0 - -    6CIT Screen 09/23/2019  What Year? 0 points  What month? 0 points  What time? 0 points  Count back from 20 0 points  Months in reverse 0 points  Repeat phrase 0 points  Total Score 0       Assessment & Plan:    Annual Physical Reviewed patient's Family Medical History Reviewed and updated list of patient's medical providers Assessment of cognitive impairment was done Assessed patient's functional ability Established a written schedule for health screening [place] Completed and Reviewed  Exercise Activities and Dietary recommendations Goals    . Exercise 150 minutes per week (moderate activity)    . Increase water intake     Recommend increasing water intake to 4-6 glasses a day.        Immunization History  Administered Date(s) Administered  . Influenza, High Dose Seasonal PF 09/23/2015, 10/05/2016, 11/01/2017, 09/19/2018  . Pneumococcal Conjugate-13 08/01/2014  . Pneumococcal Polysaccharide-23 06/03/1999  . Td 01/18/2007    Health Maintenance  Topic Date Due  . INFLUENZA VACCINE  07/20/2019  . TETANUS/TDAP  12/19/2026 (Originally 01/18/2017)  . DEXA SCAN  Completed  . PNA vac Low Risk Adult  Completed     Discussed health benefits of physical activity, and encouraged her to engage in regular exercise appropriate for her age and condition.   1. Encounter for annual physical exam  - TSH - CBC w/Diff/Platelet - Comprehensive Metabolic Panel (CMET)  2. Need for influenza vaccination  - Flu Vaccine QUAD High Dose(Fluad)  3. Hypertriglyceridemia  - TSH - CBC w/Diff/Platelet - Comprehensive Metabolic Panel (CMET)  4. Benign essential HTN  - TSH - CBC w/Diff/Platelet - Comprehensive Metabolic Panel (CMET)  5. Urinary tract infection without hematuria, site unspecified May need urogyn referral. -  CULTURE, URINE COMPREHENSIVE  6. Malignant  neoplasm of upper-inner quadrant of right breast in female, estrogen receptor negative ([place])   7. Enuresis See #5.  --------------------------------------------------------------------    Wilhemena Durie, MD  [facility]

## 2019-09-25 LAB — CBC WITH DIFFERENTIAL/PLATELET
Basophils Absolute: 0 10*3/uL (ref 0.0–0.2)
Basos: 1 %
EOS (ABSOLUTE): 0.1 10*3/uL (ref 0.0–0.4)
Eos: 2 %
Hematocrit: 41.8 % (ref 34.0–46.6)
Hemoglobin: 13.8 g/dL (ref 11.1–15.9)
Immature Grans (Abs): 0 10*3/uL (ref 0.0–0.1)
Immature Granulocytes: 0 %
Lymphocytes Absolute: 1.5 10*3/uL (ref 0.7–3.1)
Lymphs: 28 %
MCH: 31.1 pg (ref 26.6–33.0)
MCHC: 33 g/dL (ref 31.5–35.7)
MCV: 94 fL (ref 79–97)
Monocytes Absolute: 0.5 10*3/uL (ref 0.1–0.9)
Monocytes: 9 %
Neutrophils Absolute: 3.2 10*3/uL (ref 1.4–7.0)
Neutrophils: 60 %
Platelets: 188 10*3/uL (ref 150–450)
RBC: 4.44 x10E6/uL (ref 3.77–5.28)
RDW: 13.8 % (ref 11.7–15.4)
WBC: 5.3 10*3/uL (ref 3.4–10.8)

## 2019-09-25 LAB — COMPREHENSIVE METABOLIC PANEL
ALT: 17 IU/L (ref 0–32)
AST: 23 IU/L (ref 0–40)
Albumin/Globulin Ratio: 2 (ref 1.2–2.2)
Albumin: 4.5 g/dL (ref 3.6–4.6)
Alkaline Phosphatase: 56 IU/L (ref 39–117)
BUN/Creatinine Ratio: 15 (ref 12–28)
BUN: 15 mg/dL (ref 8–27)
Bilirubin Total: 0.3 mg/dL (ref 0.0–1.2)
CO2: 27 mmol/L (ref 20–29)
Calcium: 9.6 mg/dL (ref 8.7–10.3)
Chloride: 101 mmol/L (ref 96–106)
Creatinine, Ser: 0.99 mg/dL (ref 0.57–1.00)
GFR calc Af Amer: 59 mL/min/{1.73_m2} — ABNORMAL LOW (ref >59)
GFR calc non Af Amer: 51 mL/min/{1.73_m2} — ABNORMAL LOW (ref >59)
Globulin, Total: 2.3 g/dL (ref 1.5–4.5)
Glucose: 87 mg/dL (ref 65–99)
Potassium: 3.7 mmol/L (ref 3.5–5.2)
Sodium: 142 mmol/L (ref 134–144)
Total Protein: 6.8 g/dL (ref 6.0–8.5)

## 2019-09-25 LAB — TSH: TSH: 1.6 u[IU]/mL (ref 0.450–4.500)

## 2019-09-26 ENCOUNTER — Telehealth: Payer: Self-pay

## 2019-09-26 NOTE — Telephone Encounter (Signed)
-----   Message from [name]., MD sent at 09/26/2019 11:03 AM EDT ----- Urine ok.

## 2019-09-26 NOTE — Telephone Encounter (Signed)
Pt advised.   Thanks,   -Loren Sawaya  

## 2019-09-28 LAB — CULTURE, URINE COMPREHENSIVE

## 2019-09-30 ENCOUNTER — Telehealth: Payer: Self-pay

## 2019-09-30 MED ORDER — CIPROFLOXACIN HCL 250 MG PO TABS: 250.0000 mg | ORAL_TABLET | Freq: Two times a day (BID) | ORAL | 0 refills | Status: AC

## 2019-09-30 NOTE — Telephone Encounter (Signed)
-----   Message from [name]., MD sent at 09/30/2019 11:58 AM EDT ----- Clinically I do not think that this is infection but due to her symptoms we will treat with Cipro 250 mg twice daily for 5 days.

## 2019-09-30 NOTE — Telephone Encounter (Signed)
Advised patient as below. Medication was sent into the pharmacy.  

## 2019-10-17 ENCOUNTER — Other Ambulatory Visit: Payer: Self-pay | Admitting: General Surgery

## 2019-10-17 DIAGNOSIS — C50911 Malignant neoplasm of unspecified site of right female breast: Secondary | ICD-10-CM

## 2019-10-22 DIAGNOSIS — H40153 Residual stage of open-angle glaucoma, bilateral: Secondary | ICD-10-CM | POA: Diagnosis not present

## 2019-10-31 ENCOUNTER — Ambulatory Visit (INDEPENDENT_AMBULATORY_CARE_PROVIDER_SITE_OTHER): Payer: PPO | Admitting: Family Medicine

## 2019-10-31 ENCOUNTER — Other Ambulatory Visit: Payer: Self-pay

## 2019-10-31 ENCOUNTER — Encounter: Payer: Self-pay | Admitting: Family Medicine

## 2019-10-31 VITALS — BP 118/64 | HR 78 | Temp 97.3°F | Resp 16 | Ht 64.0 in | Wt 183.0 lb

## 2019-10-31 DIAGNOSIS — C50919 Malignant neoplasm of unspecified site of unspecified female breast: Secondary | ICD-10-CM | POA: Diagnosis not present

## 2019-10-31 DIAGNOSIS — N898 Other specified noninflammatory disorders of vagina: Secondary | ICD-10-CM

## 2019-10-31 NOTE — Progress Notes (Signed)
Patient: Kelly Clark Female    DOB: 01-17-1929   85 y.o.   MRN: [id number] Visit Date: 10/31/2019  Today's Provider: Wilhemena Durie, MD   No chief complaint on file.  Subjective:     HPI   Patient is here to discuss ongoing vaginal burning. Patient saw Dermatologist about 1 month ago and was given rx for clobetasol ointment. Patient states ointment is not helping. Patient states she is not seeing Dermatologist until next year and she can not go that long with the pain she is having.  No vaginal bleeding or dysuria.  No Known Allergies   Current Outpatient Medications:  .  amLODipine (NORVASC) 10 MG tablet, TAKE 1 TABLET(10 MG) BY MOUTH DAILY, Disp: 90 tablet, Rfl: 3 .  Cholecalciferol (VITAMIN D3) 10000 units TABS, Take 10,000 Units by mouth at bedtime., Disp: , Rfl:  .  clobetasol ointment (TEMOVATE) [id number] %, Apply 1 application topically once a week. Applied to affected area of vagina and rectum , Disp: , Rfl:  .  gabapentin (NEURONTIN) 600 MG tablet, TAKE 1 TABLET BY MOUTH AT BEDTIME, Disp: 90 tablet, Rfl: 3 .  Ginkgo Biloba 40 MG TABS, Take 1 tablet by mouth daily. @@ noon, Disp: , Rfl:  .  hydrochlorothiazide (HYDRODIURIL) 12.5 MG tablet, TAKE 1 TABLET BY MOUTH ONCE DAILY, Disp: 90 tablet, Rfl: 3 .  latanoprost (XALATAN) 0.005 % ophthalmic solution, Place 1 drop into both eyes at bedtime. , Disp: , Rfl: 0 .  Multiple Vitamin (MULTIVITAMIN) tablet, Take 1 tablet by mouth daily., Disp: , Rfl:  .  multivitamin-lutein (OCUVITE-LUTEIN) CAPS capsule, Take 1 capsule by mouth every morning. , Disp: , Rfl:  .  polyethylene glycol powder (MIRALAX) powder, Take 17 g by mouth at bedtime. , Disp: , Rfl:  .  PROBIOTIC PRODUCT PO, Take 1 capsule by mouth daily with lunch. , Disp: , Rfl:  .  RESVERATROL-GRAPE PO, Take 1 capsule by mouth daily. CoQ10 Resveratrol & Grape Seed, Disp: , Rfl:   Review of Systems  Constitutional: Negative for appetite change, fatigue and fever.   Respiratory: Negative for chest tightness and shortness of breath.   Cardiovascular: Negative for chest pain and palpitations.  Gastrointestinal: Negative for abdominal pain.  Genitourinary: Positive for genital sores.  Allergic/Immunologic: Negative.   Neurological: Negative for dizziness and weakness.  Psychiatric/Behavioral: Negative.     Social History   Tobacco Use  . Smoking status: Never Smoker  . Smokeless tobacco: Never Used  Substance Use Topics  . Alcohol use: No      Objective:   BP 118/64 (BP Location: Left Arm, Patient Position: Sitting, Cuff Size: Large)   Pulse 78   Temp (!) 97.3 F (36.3 C) (Other (Comment))   Resp 16   Ht 5\' 4"  (1.626 m)   Wt 183 lb (83 kg)   SpO2 96%   BMI 31.41 kg/m  Vitals:   10/31/19 1033  BP: 118/64  Pulse: 78  Resp: 16  Temp: (!) 97.3 F (36.3 C)  TempSrc: Other (Comment)  SpO2: 96%  Weight: 183 lb (83 kg)  Height: 5\' 4"  (1.626 m)  Body mass index is 31.41 kg/m.   Physical Exam Vitals signs reviewed.  Constitutional:      Appearance: Normal appearance.  Eyes:     General: No scleral icterus. Cardiovascular:     Heart sounds: Normal heart sounds.  Pulmonary:     Breath sounds: Normal breath sounds.  Abdominal:  Palpations: Abdomen is soft.  Genitourinary:    General: Normal vulva.     Comments: At posterior vaginal introitus olive size and shape mildly tender area --blood red--apparent bleeding into the local tissue--no obvious hematoma. Neurological:     Mental Status: She is alert.      No results found for any visits on 10/31/19.     Assessment & Plan    1. Vaginal irritation Stop use of steroids for now--defer to gyn evaluation.  - Ambulatory referral to Gynecology   2. Malignant neoplasm of female breast, unspecified estrogen receptor status, unspecified laterality, unspecified site of breast ([place]) Has had normal follow up. 3.Lichen scerosis    Wilhemena Durie, MD  [facility]

## 2019-10-31 NOTE — Patient Instructions (Signed)
Stop use of steroids.

## 2019-11-04 NOTE — Progress Notes (Signed)
[name]., MD   Chief Complaint  Patient presents with  . Vaginal Irritation    no discharge or odor    HPI:      [name] is a 85 y.o. G1P1 who LMP was No LMP recorded. Patient has had a hysterectomy., presents today for NP vaginal burning/irritation, referred by PCP. Diagnosed with LS by Dr. Darrick Huntsman about 2 1/2 yrs ago, treated with clobetasol oint 4 times weekly initially but now once wkly as maintenance with sx relief, until about a month ago. Sx worsened with burning and she saw derm, who suggested increasing clobetasol tx to 3 x weekly due to flare up. Pt then saw PCP who was concerned with 3x weekly dosing of high potency steroid.  Referred here for eval. Pt using vaseline externally for burning sx. Has f/u with derm 1/21.  S/p TAH. No vag d/c, odor.   Patient Active Problem List   Diagnosis Date Noted  . Lichen sclerosus 17/49/4496  . Actinic keratosis 09/21/2018  . Malignant neoplasm of upper-inner quadrant of right breast in female, estrogen receptor negative ([place]) 07/11/2018  . Benign essential HTN 09/08/2017  . Adult BMI 30+ 07/06/2015  . Hemorrhoid 07/06/2015  . Neutropenia ([place]) 07/06/2015  . Disorder of peripheral nervous system 07/06/2015  . Avitaminosis D 07/06/2015  . Vaginal atrophy 07/06/2015  . Abnormal LFTs 08/21/2009  . Cannot sleep 05/23/2000  . Hypertriglyceridemia 05/19/2000    Past Surgical History:  Procedure Laterality Date  . ABDOMINAL HYSTERECTOMY  1977  . BREAST BIOPSY Bilateral 1977   benign  . BREAST BIOPSY Right 06/27/2018   INVASIVE MAMMARY CARCINOMA, NO SPECIAL TYPE. 2 oclock ER/PR negative HER2 positive  . BREAST EXCISIONAL BIOPSY Right 07/23/2018   lumpectomy  . BREAST LUMPECTOMY WITH SENTINEL LYMPH NODE BIOPSY Right 07/23/2018   6 mm ER/PR negative, Her 2 neu 3+; node negative.  Surgeon: Robert Bellow, MD;  Location: [facility] ORS;  Service: General;  Laterality: Right;  . COLONOSCOPY  2007  . EYE SURGERY  Bilateral    CATARACT EXTRACTIONS    Family History  Problem Relation Age of Onset  . Heart attack Mother   . Congestive Heart Failure Mother   . Stroke Father   . Colon cancer Sister 92  . Throat cancer Maternal Grandmother 59  . Healthy Daughter   . Breast cancer Neg Hx     Social History   Socioeconomic History  . Marital status: Widowed    Spouse name: Not on file  . Number of children: 1  . Years of education: H/S  . Highest education level: Some college, no degree  Occupational History  . Occupation: Retired  Scientific laboratory technician  . Financial resource strain: Not hard at all  . Food insecurity    Worry: Never true    Inability: Never true  . Transportation needs    Medical: No    Non-medical: No  Tobacco Use  . Smoking status: Never Smoker  . Smokeless tobacco: Never Used  Substance and Sexual Activity  . Alcohol use: No  . Drug use: No  . Sexual activity: Not Currently  Lifestyle  . Physical activity    Days per week: 0 days    Minutes per session: 0 min  . Stress: Not at all  Relationships  . Social connections    Talks on phone: Patient refused    Gets together: Patient refused    Attends religious service: Patient refused  Active member of club or organization: Patient refused    Attends meetings of clubs or organizations: Patient refused    Relationship status: Patient refused  . Intimate partner violence    Fear of current or ex partner: Patient refused    Emotionally abused: Patient refused    Physically abused: Patient refused    Forced sexual activity: Patient refused  Other Topics Concern  . Not on file  Social History Narrative  . Not on file    Outpatient Medications Prior to Visit  Medication Sig Dispense Refill  . amLODipine (NORVASC) 10 MG tablet TAKE 1 TABLET(10 MG) BY MOUTH DAILY 90 tablet 3  . Cholecalciferol (VITAMIN D3) 10000 units TABS Take 10,000 Units by mouth at bedtime.    . clobetasol ointment (TEMOVATE) 4.16 % Apply 1  application topically once a week. Applied to affected area of vagina and rectum     . gabapentin (NEURONTIN) 600 MG tablet TAKE 1 TABLET BY MOUTH AT BEDTIME 90 tablet 3  . Ginkgo Biloba 40 MG TABS Take 1 tablet by mouth daily. @@ noon    . hydrochlorothiazide (HYDRODIURIL) 12.5 MG tablet TAKE 1 TABLET BY MOUTH ONCE DAILY 90 tablet 3  . latanoprost (XALATAN) 0.005 % ophthalmic solution Place 1 drop into both eyes at bedtime.   0  . Multiple Vitamin (MULTIVITAMIN) tablet Take 1 tablet by mouth daily.    . multivitamin-lutein (OCUVITE-LUTEIN) CAPS capsule Take 1 capsule by mouth every morning.     . polyethylene glycol powder (MIRALAX) powder Take 17 g by mouth at bedtime.     Marland Kitchen RESVERATROL-GRAPE PO Take 1 capsule by mouth daily. CoQ10 Resveratrol & Grape Seed    . PROBIOTIC PRODUCT PO Take 1 capsule by mouth daily with lunch.      No facility-administered medications prior to visit.       ROS:  Review of Systems  Constitutional: Negative for fever.  Gastrointestinal: Negative for blood in stool, constipation, diarrhea, nausea and vomiting.  Genitourinary: Positive for vaginal pain. Negative for dyspareunia, dysuria, flank pain, frequency, hematuria, urgency, vaginal bleeding and vaginal discharge.  Musculoskeletal: Negative for back pain.  Skin: Negative for rash.    OBJECTIVE:   Vitals:  BP 140/80   Ht '5\' 2"'$  (1.575 m)   Wt 183 lb (83 kg)   BMI 33.47 kg/m   Physical Exam Vitals signs reviewed.  Constitutional:      Appearance: She is well-developed.  Neck:     Musculoskeletal: Normal range of motion.  Pulmonary:     Effort: Pulmonary effort is normal.  Genitourinary:    Labia:        Right: No rash, tenderness or lesion.        Left: No rash, tenderness or lesion.     Musculoskeletal: Normal range of motion.  Skin:    General: Skin is warm and dry.  Neurological:     General: No focal deficit present.     Mental Status: She is alert and oriented to person, place,  and time.     Cranial Nerves: No cranial nerve deficit.  Psychiatric:        Mood and Affect: Mood normal.        Behavior: Behavior normal.        Thought Content: Thought content normal.        Judgment: Judgment normal.     Assessment/Plan: Lichen sclerosus--Sx flare in perineal area and post fourchette. Overall, doesn't look severe. Suggested clobetasol oint 3 times  weekly for 2 wks, then twice weekly for 2-4 wks, then back to once wkly. Will have derm f/u by 1/21 as well. Discussed steroid tx for LS and appropriate tx frequency. Pt also aware of importance of vaginal eval to rule out skin cancer with LS.  F/u prn.     Return if symptoms worsen or fail to improve.  Trystyn Sitts B. Skyley Grandmaison, PA-C 11/05/2019 10:58 AM

## 2019-11-05 ENCOUNTER — Ambulatory Visit (INDEPENDENT_AMBULATORY_CARE_PROVIDER_SITE_OTHER): Payer: PPO | Admitting: Obstetrics and Gynecology

## 2019-11-05 ENCOUNTER — Other Ambulatory Visit: Payer: Self-pay

## 2019-11-05 ENCOUNTER — Encounter: Payer: Self-pay | Admitting: Obstetrics and Gynecology

## 2019-11-05 VITALS — BP 140/80 | Ht 62.0 in | Wt 183.0 lb

## 2019-11-05 DIAGNOSIS — L900 Lichen sclerosus et atrophicus: Secondary | ICD-10-CM | POA: Insufficient documentation

## 2019-11-05 NOTE — Patient Instructions (Signed)
I value your feedback and entrusting us with your care. If you get a [facility] patient survey, I would appreciate you taking the time to let us know about your experience today. Thank you! 

## 2019-11-07 ENCOUNTER — Ambulatory Visit: Payer: PPO | Admitting: Family Medicine

## 2019-12-25 DIAGNOSIS — L900 Lichen sclerosus et atrophicus: Secondary | ICD-10-CM | POA: Diagnosis not present

## 2019-12-25 DIAGNOSIS — Z08 Encounter for follow-up examination after completed treatment for malignant neoplasm: Secondary | ICD-10-CM | POA: Diagnosis not present

## 2019-12-25 DIAGNOSIS — D485 Neoplasm of uncertain behavior of skin: Secondary | ICD-10-CM | POA: Diagnosis not present

## 2019-12-25 DIAGNOSIS — Z85820 Personal history of malignant melanoma of skin: Secondary | ICD-10-CM | POA: Diagnosis not present

## 2019-12-25 DIAGNOSIS — C44319 Basal cell carcinoma of skin of other parts of face: Secondary | ICD-10-CM | POA: Diagnosis not present

## 2019-12-25 DIAGNOSIS — Z85828 Personal history of other malignant neoplasm of skin: Secondary | ICD-10-CM | POA: Diagnosis not present

## 2020-01-01 DIAGNOSIS — M531 Cervicobrachial syndrome: Secondary | ICD-10-CM | POA: Diagnosis not present

## 2020-01-01 DIAGNOSIS — M9901 Segmental and somatic dysfunction of cervical region: Secondary | ICD-10-CM | POA: Diagnosis not present

## 2020-01-01 IMAGING — MG US  BREAST BX W/ LOC DEV 1ST LESION IMG BX SPEC US GUIDE*R*
1 series · 8 of 8 positions shown · non-contrast
Comparison: Previous exam(s).

ADDENDUM:
Pathology results: Pathology results from the ultrasound-guided
biopsy of the mass in the right breast at the 2 o'clock position
revealed invasive mammary carcinoma, grade 2.

This is concordant with the imaging findings. The patient has been
notified of the results. She is doing well and denies any biopsy
site complications. The patient's physician's office has been
notified of the results and recommendations.
Referral to an oncologist is recommended as the patient initially
stated that she will not undergo any surgeries, however after
subsequent further discussion with the patient she is interested in
hearing all potential options. The patient has been instructed to
call the [REDACTED] with any questions or concerns.
CLINICAL DATA: 89-year-old female with a suspicious mass in the
right breast at the 2 o'clock position 9 cm from nipple.
EXAM:
ULTRASOUND GUIDED RIGHT BREAST CORE NEEDLE BIOPSY

[Series 1: MG view · 0.06mm/px · 8 of 12 slices shown]
[im 1/12]
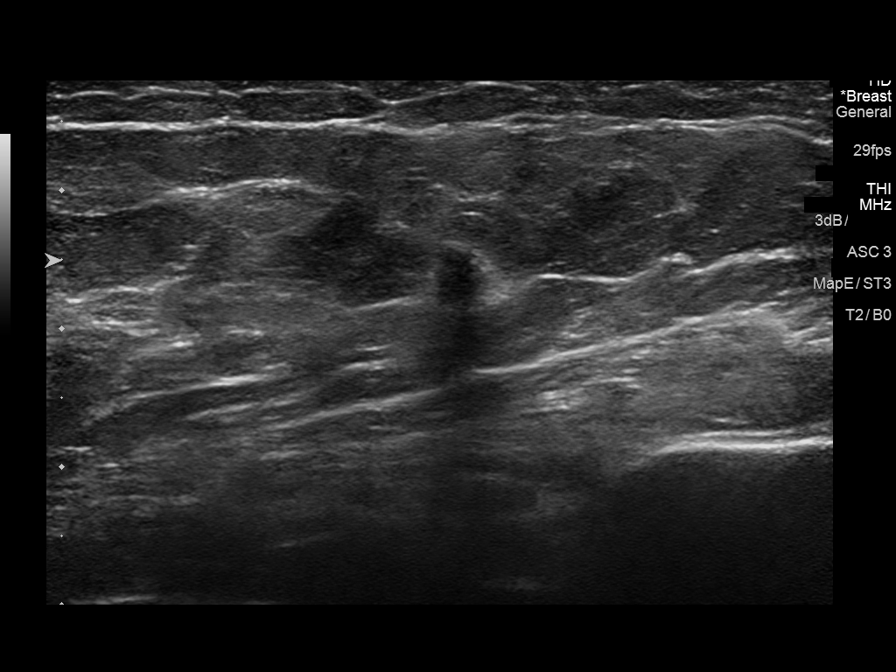
[im 2/12]
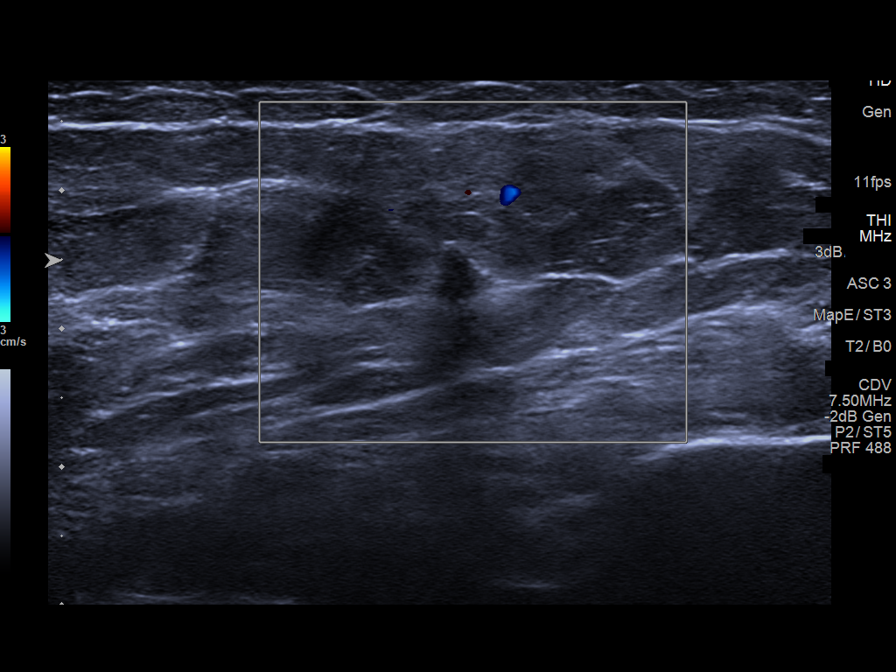
[im 4/12]
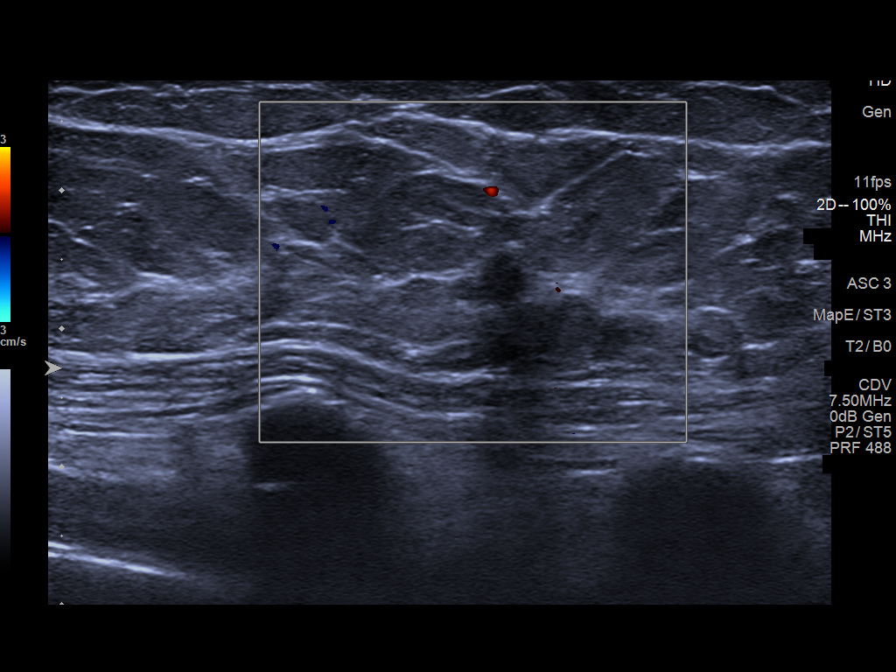
[im 5/12]
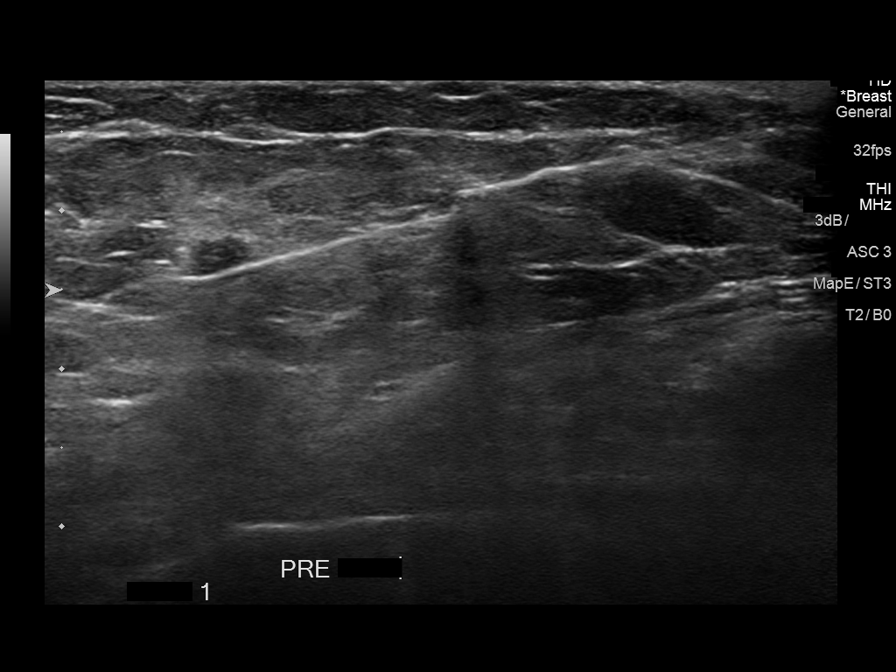
[im 7/12]
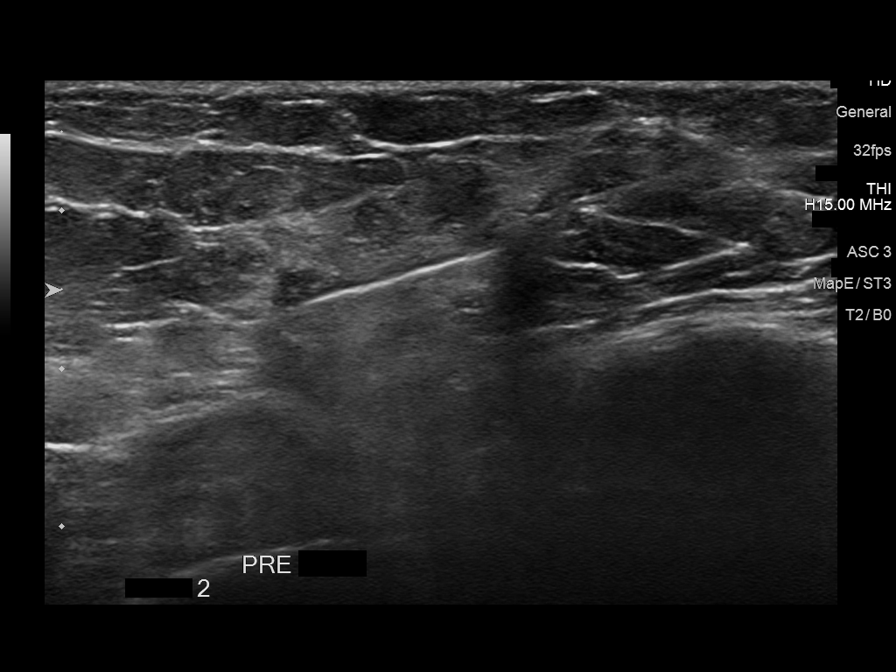
[im 8/12]
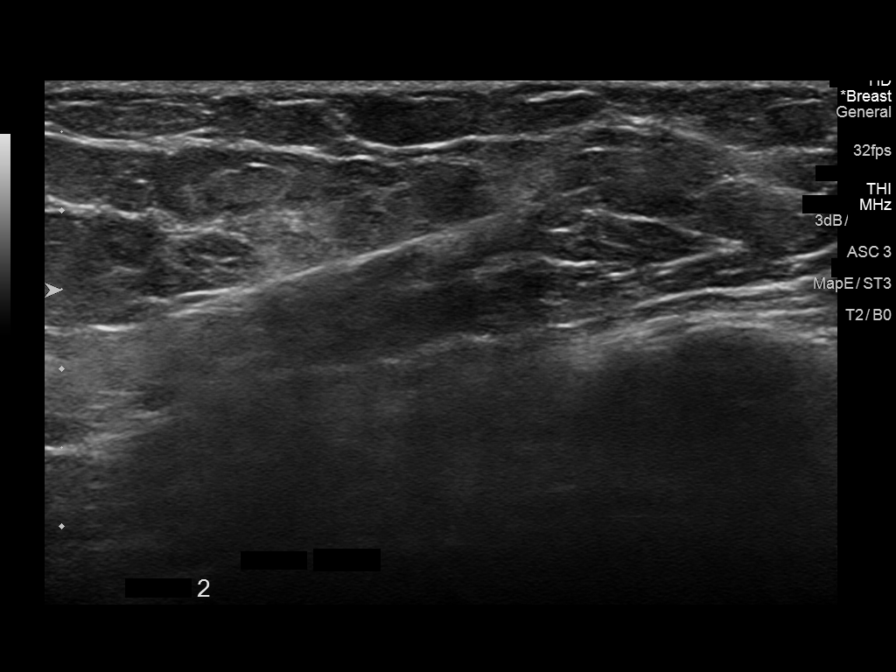
[im 10/12]
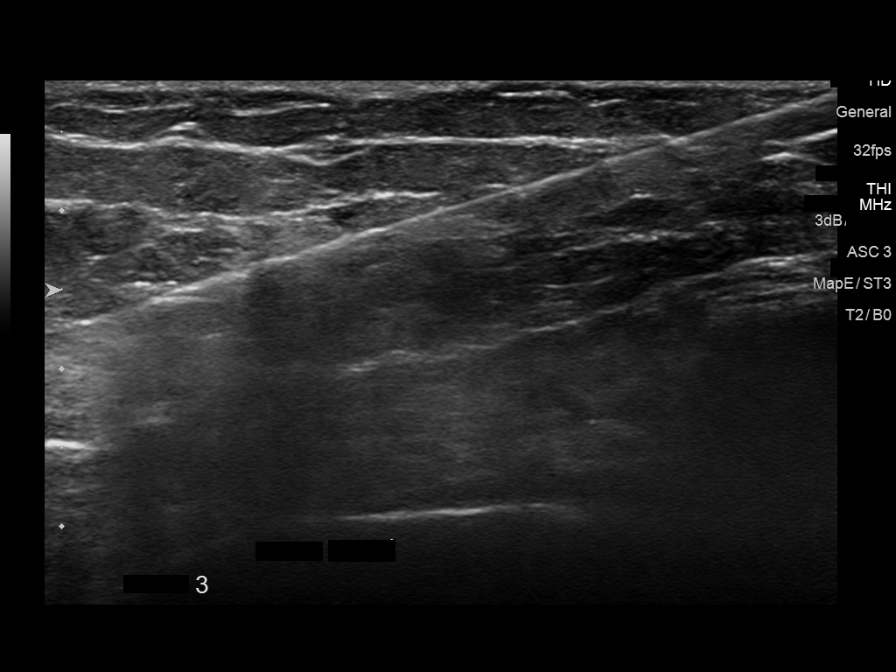
[im 12/12]
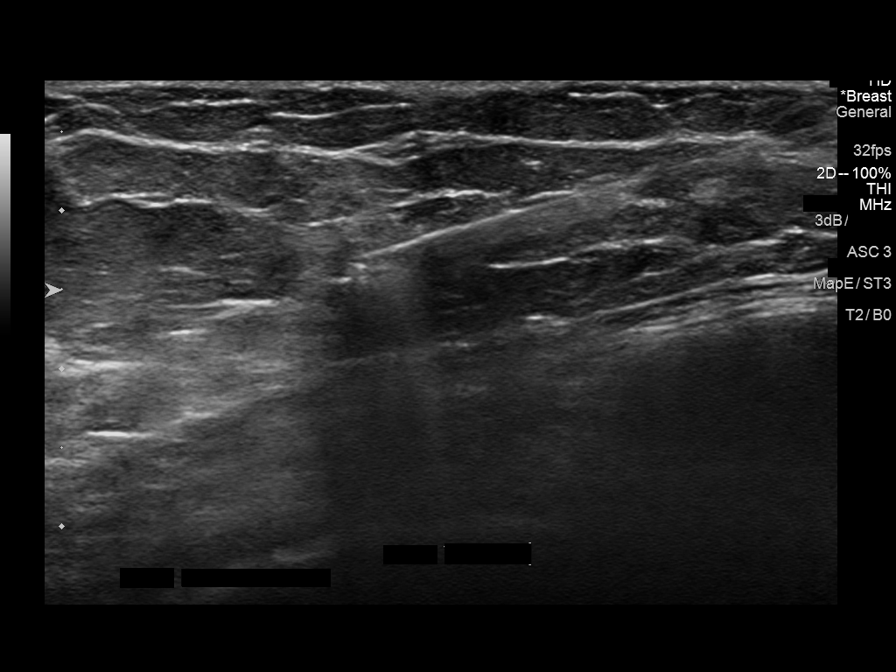

[8 of 8 positions shown; findings below may reference images not displayed]



Lesion quadrant: Upper inner

Using sterile technique and 1% Lidocaine as local anesthetic, under
direct ultrasound visualization, a 14 gauge Pratap device was
used to perform biopsy of the mass in the right breast at the 2
o'clock position using a medial to lateral approach. At the
conclusion of the procedure [place] tissue marker clip was deployed
into the biopsy cavity. Follow up 2 view mammogram was performed and
dictated separately.
IMPRESSION: Ultrasound guided biopsy of the mass in the right breast at the 2
o'clock position. No apparent complications.

## 2020-01-02 DIAGNOSIS — C44319 Basal cell carcinoma of skin of other parts of face: Secondary | ICD-10-CM | POA: Diagnosis not present

## 2020-01-03 DIAGNOSIS — M531 Cervicobrachial syndrome: Secondary | ICD-10-CM | POA: Diagnosis not present

## 2020-01-03 DIAGNOSIS — M9901 Segmental and somatic dysfunction of cervical region: Secondary | ICD-10-CM | POA: Diagnosis not present

## 2020-01-06 DIAGNOSIS — M531 Cervicobrachial syndrome: Secondary | ICD-10-CM | POA: Diagnosis not present

## 2020-01-06 DIAGNOSIS — M9901 Segmental and somatic dysfunction of cervical region: Secondary | ICD-10-CM | POA: Diagnosis not present

## 2020-01-10 ENCOUNTER — Ambulatory Visit
Admission: RE | Admit: 2020-01-10 | Discharge: 2020-01-10 | Disposition: A | Discharge status: Home / Self Care | Payer: PPO | Source: Ambulatory Visit | Attending: General Surgery | Admitting: General Surgery

## 2020-01-10 DIAGNOSIS — Z853 Personal history of malignant neoplasm of breast: Secondary | ICD-10-CM | POA: Insufficient documentation

## 2020-01-10 DIAGNOSIS — M9901 Segmental and somatic dysfunction of cervical region: Secondary | ICD-10-CM | POA: Diagnosis not present

## 2020-01-10 DIAGNOSIS — Z9011 Acquired absence of right breast and nipple: Secondary | ICD-10-CM | POA: Diagnosis not present

## 2020-01-10 DIAGNOSIS — R928 Other abnormal and inconclusive findings on diagnostic imaging of breast: Secondary | ICD-10-CM | POA: Diagnosis not present

## 2020-01-10 DIAGNOSIS — C50911 Malignant neoplasm of unspecified site of right female breast: Secondary | ICD-10-CM

## 2020-01-10 DIAGNOSIS — M531 Cervicobrachial syndrome: Secondary | ICD-10-CM | POA: Diagnosis not present

## 2020-01-17 DIAGNOSIS — M9901 Segmental and somatic dysfunction of cervical region: Secondary | ICD-10-CM | POA: Diagnosis not present

## 2020-01-17 DIAGNOSIS — M531 Cervicobrachial syndrome: Secondary | ICD-10-CM | POA: Diagnosis not present

## 2020-01-21 DIAGNOSIS — C50211 Malignant neoplasm of upper-inner quadrant of right female breast: Secondary | ICD-10-CM | POA: Diagnosis not present

## 2020-01-24 DIAGNOSIS — M531 Cervicobrachial syndrome: Secondary | ICD-10-CM | POA: Diagnosis not present

## 2020-01-24 DIAGNOSIS — M9901 Segmental and somatic dysfunction of cervical region: Secondary | ICD-10-CM | POA: Diagnosis not present

## 2020-02-03 DIAGNOSIS — M531 Cervicobrachial syndrome: Secondary | ICD-10-CM | POA: Diagnosis not present

## 2020-02-03 DIAGNOSIS — H40153 Residual stage of open-angle glaucoma, bilateral: Secondary | ICD-10-CM | POA: Diagnosis not present

## 2020-02-03 DIAGNOSIS — M9901 Segmental and somatic dysfunction of cervical region: Secondary | ICD-10-CM | POA: Diagnosis not present

## 2020-02-10 DIAGNOSIS — M531 Cervicobrachial syndrome: Secondary | ICD-10-CM | POA: Diagnosis not present

## 2020-02-10 DIAGNOSIS — M9901 Segmental and somatic dysfunction of cervical region: Secondary | ICD-10-CM | POA: Diagnosis not present

## 2020-02-24 DIAGNOSIS — M9901 Segmental and somatic dysfunction of cervical region: Secondary | ICD-10-CM | POA: Diagnosis not present

## 2020-02-24 DIAGNOSIS — M531 Cervicobrachial syndrome: Secondary | ICD-10-CM | POA: Diagnosis not present

## 2020-03-02 DIAGNOSIS — M531 Cervicobrachial syndrome: Secondary | ICD-10-CM | POA: Diagnosis not present

## 2020-03-02 DIAGNOSIS — M9901 Segmental and somatic dysfunction of cervical region: Secondary | ICD-10-CM | POA: Diagnosis not present

## 2020-03-09 DIAGNOSIS — M9901 Segmental and somatic dysfunction of cervical region: Secondary | ICD-10-CM | POA: Diagnosis not present

## 2020-03-09 DIAGNOSIS — M531 Cervicobrachial syndrome: Secondary | ICD-10-CM | POA: Diagnosis not present

## 2020-03-30 DIAGNOSIS — M531 Cervicobrachial syndrome: Secondary | ICD-10-CM | POA: Diagnosis not present

## 2020-03-30 DIAGNOSIS — M9901 Segmental and somatic dysfunction of cervical region: Secondary | ICD-10-CM | POA: Diagnosis not present

## 2020-04-07 DIAGNOSIS — R42 Dizziness and giddiness: Secondary | ICD-10-CM | POA: Diagnosis not present

## 2020-04-07 DIAGNOSIS — H6123 Impacted cerumen, bilateral: Secondary | ICD-10-CM | POA: Diagnosis not present

## 2020-04-07 DIAGNOSIS — H903 Sensorineural hearing loss, bilateral: Secondary | ICD-10-CM | POA: Diagnosis not present

## 2020-04-08 DIAGNOSIS — R42 Dizziness and giddiness: Secondary | ICD-10-CM | POA: Diagnosis not present

## 2020-04-13 DIAGNOSIS — H903 Sensorineural hearing loss, bilateral: Secondary | ICD-10-CM | POA: Diagnosis not present

## 2020-04-13 DIAGNOSIS — R42 Dizziness and giddiness: Secondary | ICD-10-CM | POA: Diagnosis not present

## 2020-05-13 ENCOUNTER — Other Ambulatory Visit: Payer: Self-pay

## 2020-05-13 ENCOUNTER — Encounter: Payer: Self-pay | Admitting: Family Medicine

## 2020-05-13 ENCOUNTER — Ambulatory Visit: Payer: Self-pay | Admitting: *Deleted

## 2020-05-13 ENCOUNTER — Ambulatory Visit (INDEPENDENT_AMBULATORY_CARE_PROVIDER_SITE_OTHER): Payer: PPO | Admitting: Family Medicine

## 2020-05-13 VITALS — BP 123/75 | HR 83 | Temp 97.3°F | Ht 62.0 in | Wt 185.8 lb

## 2020-05-13 DIAGNOSIS — M7989 Other specified soft tissue disorders: Secondary | ICD-10-CM

## 2020-05-13 DIAGNOSIS — C50211 Malignant neoplasm of upper-inner quadrant of right female breast: Secondary | ICD-10-CM | POA: Diagnosis not present

## 2020-05-13 DIAGNOSIS — G64 Other disorders of peripheral nervous system: Secondary | ICD-10-CM | POA: Diagnosis not present

## 2020-05-13 DIAGNOSIS — I10 Essential (primary) hypertension: Secondary | ICD-10-CM | POA: Diagnosis not present

## 2020-05-13 DIAGNOSIS — Z171 Estrogen receptor negative status [ER-]: Secondary | ICD-10-CM | POA: Diagnosis not present

## 2020-05-13 DIAGNOSIS — R42 Dizziness and giddiness: Secondary | ICD-10-CM | POA: Diagnosis not present

## 2020-05-13 NOTE — Telephone Encounter (Signed)
Pt called with complaints of dizziness, and BP 190/90 on 05/12/20; she has been dizziness since 03/02/20;she states that she is walking forward,and backward; she just flew back from [place], and she saw a pharmacist there; she was told that this was a side effect of amlodipine; she had to hold on to the wall to walk; she describes her dizziness as lightheadedness, and it has been worsening for the past 2 weeks;   The pt says that she has swelling in hr feet and ankles that she first noticed 2 weeks ago; she has seen ENT and was told that the crystals in her ear are not affected, but she is to have a balance test on 05/15/20; the pt would like to see someone to discuss her amlodipine; recommendations made per nurse triage protocol; decision tree completed; pt offered and accepted appt Dr Epimen[facility], 05/13/20 at 1420; she verbalized understanding; will route to office for notification. Reason for Disposition . Taking a medicine that could cause dizziness (e.g., blood pressure medications, diuretics)  Answer Assessment - Initial Assessment Questions 1. DESCRIPTION: "Describe your dizziness."     Light headed 2. LIGHTHEADED: "Do you feel lightheaded?" (e.g., somewhat faint, woozy, weak upon standing)     When she wakes up, and standing 3. VERTIGO: "Do you feel like either you or the room is spinning or tilting?" (i.e. vertigo)     no 4. SEVERITY: "How bad is it?"  "Do you feel like you are going to faint?" "Can you stand and walk?"   - MILD - walking normally   - MODERATE - interferes with normal activities (e.g., work, school)    - SEVERE - unable to stand, requires support to walk, feels like passing out now.     Mild to severe; mild today 5. ONSET:  "When did the dizziness begin?"     03/02/20 6. AGGRAVATING FACTORS: "Does anything make it worse?" (e.g., standing, change in head position)    no 7. HEART RATE: "Can you tell me your heart rate?" "How many beats in 15 seconds?"  (Note:  not all patients can do this)       BP 143/83 on 05/13/20; did not check pulse 8. CAUSE: "What do you think is causing the dizziness?"     amlodipine 9. RECURRENT SYMPTOM: "Have you had dizziness before?" If so, ask: "When was the last time?" "What happened that time?"    Yes, every 2 years; crystals in ears out of place 10. OTHER SYMPTOMS: "Do you have any other symptoms?" (e.g., fever, chest pain, vomiting, diarrhea, bleeding)       Feet and ankle swelling; skin on feet tight; first noticed about 2 weeks ago 11. PREGNANCY: "Is there any chance you are pregnant?" "When was your last menstrual period?"     no  Protocols used: DIZZINESS [facility]

## 2020-05-13 NOTE — Progress Notes (Signed)
Trena Platt Cummings,acting as a scribe for Wilhemena Durie, MD.,have documented all relevant documentation on the behalf of Wilhemena Durie, MD,as directed by  Wilhemena Durie, MD while in the presence of Wilhemena Durie, MD.  Established patient visit   Patient: Kelly Clark   DOB: 08/29/1929   86 y.o. Female  MRN: [id number] Visit Date: 05/13/2020  Today's healthcare provider: Wilhemena Durie, MD   Chief Complaint  Patient presents with  . Dizziness   Subjective    Dizziness This is a new problem. The current episode started more than 1 month ago. The problem occurs constantly. Associated symptoms include joint swelling and vertigo. Pertinent negatives include no abdominal pain, chest pain, chills, congestion, coughing, diaphoresis, fatigue, fever, headaches, neck pain, numbness, sore throat, visual change, vomiting or weakness. The symptoms are aggravated by standing and walking. She has tried lying down for the symptoms. The treatment provided no relief.  Many times it feels like the room is spinning.  She is having increased difficulty with ambulation and tends to fall backwards into the right.  She is very careful when she walks and therefore has not fallen despite this tendency. She has seen ENT for this and has follow-up appointment in the future. She has had no head trauma and complains of no speech difficulty swallowing difficulties visual disturbance or any weakness in any limb.  Patient was told that this was a side effect of Amlodipine. Patient checked BP earlier and it was 143/83.   Patient also flew back from [place] yesterday. Patient also seen a doctor and had a hearing test and will have to start a balance class.   Patient also took meclizine but she does like taking it because it makes her sleepy.       Medications: Outpatient Medications Prior to Visit  Medication Sig  . amLODipine (NORVASC) 10 MG tablet TAKE 1 TABLET(10 MG) BY MOUTH DAILY    . Cholecalciferol (VITAMIN D3) 10000 units TABS Take 10,000 Units by mouth at bedtime.  . clobetasol ointment (TEMOVATE) [id number] % Apply 1 application topically once a week. Applied to affected area of vagina and rectum   . gabapentin (NEURONTIN) 600 MG tablet TAKE 1 TABLET BY MOUTH AT BEDTIME  . Ginkgo Biloba 40 MG TABS Take 1 tablet by mouth daily. @@ noon  . hydrochlorothiazide (HYDRODIURIL) 12.5 MG tablet TAKE 1 TABLET BY MOUTH ONCE DAILY  . latanoprost (XALATAN) 0.005 % ophthalmic solution Place 1 drop into both eyes at bedtime.   . Multiple Vitamin (MULTIVITAMIN) tablet Take 1 tablet by mouth daily.  . multivitamin-lutein (OCUVITE-LUTEIN) CAPS capsule Take 1 capsule by mouth every morning.   . polyethylene glycol powder (MIRALAX) powder Take 17 g by mouth at bedtime.   Marland Kitchen RESVERATROL-GRAPE PO Take 1 capsule by mouth daily. CoQ10 Resveratrol & Grape Seed   No facility-administered medications prior to visit.    Review of Systems  Constitutional: Negative for chills, diaphoresis, fatigue and fever.  HENT: Negative for congestion and sore throat.   Eyes: Negative.   Respiratory: Negative for cough.   Cardiovascular: Negative for chest pain.  Gastrointestinal: Negative for abdominal pain and vomiting.  Endocrine: Negative.   Musculoskeletal: Positive for gait problem and joint swelling. Negative for neck pain.  Neurological: Positive for dizziness and vertigo. Negative for weakness, numbness and headaches.  Hematological: Negative.   Psychiatric/Behavioral: Negative.        Objective    BP 123/75 (BP Location: Left Arm, Patient  Position: Sitting, Cuff Size: Normal)   Pulse 83   Temp (!) 97.3 F (36.3 C) (Temporal)   Ht 5\' 2"  (1.575 m)   Wt 185 lb 12.8 oz (84.3 kg)   SpO2 97%   BMI 33.98 kg/m     Physical Exam Vitals reviewed.  Constitutional:      Appearance: Normal appearance.  HENT:     Head: Normocephalic and atraumatic.     Right Ear: External ear normal.     Left  Ear: External ear normal.  Eyes:     General: No scleral icterus.    Conjunctiva/sclera: Conjunctivae normal.  Cardiovascular:     Rate and Rhythm: Normal rate and regular rhythm.     Pulses: Normal pulses.     Heart sounds: Normal heart sounds.  Pulmonary:     Effort: Pulmonary effort is normal.     Breath sounds: Normal breath sounds.  Musculoskeletal:     Right lower leg: Edema present.     Left lower leg: Edema present.     Comments: Trace lower extremity edema  Skin:    General: Skin is warm and dry.  Neurological:     General: No focal deficit present.     Mental Status: She is alert and oriented to person, place, and time.     Cranial Nerves: No cranial nerve deficit.     Motor: No weakness.     Gait: Gait abnormal.     Comments: Romberg is normal and finger to nose is normal.  She does have a very wide-based gait.  She is very unsteady when she is on her feet.  Very unsteady while walking.  Psychiatric:        Mood and Affect: Mood normal.        Behavior: Behavior normal.        Thought Content: Thought content normal.        Judgment: Judgment normal.    ECG--NSR with no ischemic changes.     No results found for any visits on 05/13/20.  Assessment & Plan     1. Leg swelling Recommended support hose. - EKG 12-Lead  2. Dizziness She is having significant gait and balance issues with this.  This could be managed vertigo which have recommended taking 3 times a day meclizine.  MRI of the brain to rule out CVA with her gait abnormality in the office.  I will see her back in 1 to 2 weeks. - MR Brain W Wo Contrast  3. Benign essential HTN Good blood pressure control today.  4. Disorder of peripheral nervous system Neuropathy  5. Malignant neoplasm of upper-inner quadrant of right breast in female, estrogen receptor negative ([employer]) History of breast cancer   No follow-ups on file.      I, Wilhemena Durie, MD, have reviewed all documentation for this  visit. The documentation on 05/17/20 for the exam, diagnosis, procedures, and orders are all accurate and complete.    Gardy Montanari Cranford Mon, MD  [facility] [phone] (phone) [phone] (fax)  [place]

## 2020-05-13 NOTE — Patient Instructions (Signed)
TAKE MECLIZINE MORNING/ LUNCH/ DINNER!!!

## 2020-05-15 ENCOUNTER — Other Ambulatory Visit: Payer: Self-pay

## 2020-05-15 ENCOUNTER — Ambulatory Visit: Payer: PPO | Attending: Unknown Physician Specialty

## 2020-05-15 DIAGNOSIS — R2681 Unsteadiness on feet: Secondary | ICD-10-CM | POA: Diagnosis not present

## 2020-05-15 DIAGNOSIS — R42 Dizziness and giddiness: Secondary | ICD-10-CM | POA: Diagnosis not present

## 2020-05-15 NOTE — Therapy (Addendum)
[place] MAIN [facility] SERVICES [address] [place], [place], [address] Phone: [phone]   Fax:  [phone]   Physical Therapy Evaluation  Patient Details  Name: Kelly Clark MRN: [id number] Date of Birth: 1929-03-04 Referring Provider (PT): Dr. Tami Ribas   Encounter Date: 05/15/2020  PT End of Session - 05/15/20 1347    Visit Number  1    Number of Visits  9    Date for PT Re-Evaluation  07/10/20    Authorization Type  eval: 05/15/20, FOTO COMPLETED    PT Start Time  1040    PT Stop Time  1140    PT Time Calculation (min)  60 min    Activity Tolerance  Patient tolerated treatment well    Behavior During Therapy  [facility] for tasks assessed/performed       Past Medical History:  Diagnosis Date  . Basal cell carcinoma 2019   RIGHT THUMB REGION  . Breast cancer ([place]) 06/27/2018   RIGHT  . Cancer ([place]) 1977   melanoma . SURGERY RIGHT ARM  . Hemorrhoids   . Hypertension   . Hypertriglyceridemia   . Insomnia   . Menopausal disorder   . Neutropenia ([place])   . Paresthesia   . Peripheral neuropathy   . Tinea corporis   . Vitamin D deficiency     Past Surgical History:  Procedure Laterality Date  . ABDOMINAL HYSTERECTOMY  1977  . BREAST BIOPSY Bilateral 1977   benign  . BREAST BIOPSY Right 06/27/2018   INVASIVE MAMMARY CARCINOMA, NO SPECIAL TYPE. 2 oclock ER/PR negative HER2 positive  . BREAST EXCISIONAL BIOPSY Right 07/23/2018   lumpectomy  . BREAST LUMPECTOMY WITH SENTINEL LYMPH NODE BIOPSY Right 07/23/2018   6 mm ER/PR negative, Her 2 neu 3+; node negative.  Surgeon: Robert Bellow, MD;  Location: [facility] ORS;  Service: General;  Laterality: Right;  . COLONOSCOPY  2007  . ELECTROCARDIOGRAM    . EYE SURGERY Bilateral    CATARACT EXTRACTIONS    There were no vitals filed for this visit.     Subjective Assessment - 05/16/20 1653    Subjective  Dizziness    Pertinent History  Pt reports that her dizziness symptoms began  03/01/20. She got up from her bed and she states that she started spinning. Feels like she is being pulled to the right or falling backwards. She underwent VNG testing at [facility] on 04/08/20 with abnormal saccades and smooth pursuit. She had a 60% caloric weakness in the right ear. Positive for L horizontal canal BPPV during VNG per report but not treated. She had a follow-up with Dr. Tami Ribas who referred her for VRT.    Limitations  Walking    Diagnostic tests  MRI ordered but not yet performed    Patient Stated Goals  Decrease dizziness and improve her walking    Currently in Pain?  --   Not questioned, unrelated to current episodes      VESTIBULAR AND BALANCE EVALUATION   HISTORY:  Subjective history of current problem: Pt reports that her dizziness symptoms began 03/01/20. She got up from her bed and she states that she started spinning. Feels like she is being pulled to the right or falling backwards. She underwent VNG testing at [facility] on 04/08/20 with abnormal saccades and smooth pursuit. She had a 60% caloric weakness in the right ear. Positive for L horizontal canal BPPV during VNG per report but not treated. She had a  follow-up with Dr. Tami Ribas who referred her for VRT.  Description of dizziness: (vertigo, unsteadiness, lightheadedness, falling, general unsteadiness, whoozy, swimmy-headed sensation, aural fullness) vertigo, unsteadiness Frequency: Daily  Duration: "All day long." Pt also having other episodes which are brief such as when laying down for bed Symptom nature: (motion provoked, positional, spontaneous, constant, variable, intermittent) Motion provoked  Provocative Factors: Getting up from laying down, laying down at night Easing Factors: Waiting for symptoms to pass  Progression of symptoms: (better, worse, no change since onset) Unchanged History of similar episodes: Prior history of BPPV.   Prior Functional Level: Pt lives alone and manages ADLs/IADLs. Drives  and reports ambulation with single point cane since onset of symptoms  Auditory complaints (tinnitus, pain, drainage, hearing loss, aural fullness): Hearing loss on audiogram per patient.  Vision (diplopia, visual field loss, recent changes, last eye exam): Pt wears corrective lenses. Otherwise denies visual changes  Red Flags: (dysarthria, dysphagia, drop attacks, bowel and bladder changes, recent weight loss/gain) Review of systems negative for red flags.     EXAMINATION  POSTURE:   NEUROLOGICAL SCREEN: (2+ unless otherwise noted.) N=normal  Ab=abnormal  Level Dermatome R L Myotome R L Reflex R L  C3 Anterior Neck N N Sidebend C2-3 N N Jaw CN V    C4 Top of Shoulder N N Shoulder Shrug C4 N N Hoffman's UMN    C5 Lateral Upper Arm N N Shoulder ABD C4-5 N N Biceps C5-6    C6 Lateral Arm/ Thumb N N Arm Flex/ Wrist Ext C5-6 N N Brachiorad. C5-6    C7 Middle Finger N N Arm Ext//Wrist Flex C6-7 N N Triceps C7    C8 4th & 5th Finger N N Flex/ Ext Carpi Ulnaris C8 N N Patellar (L3-4)    T1 Medial Arm N N Interossei T1 N N Gastrocnemius    L2 Medial thigh/groin N N Illiopsoas (L2-3) N N     L3 Lower thigh/med.knee N N Quadriceps (L3-4) N N     L4 Medial leg/lat thigh N N Tibialis Ant (L4-5) N N     L5 Lat. leg & dorsal foot N N EHL (L5) N N     S1 post/lat foot/thigh/leg N N Gastrocnemius (S1-2) N N     S2 Post./med. thigh & leg N N Hamstrings (L4-S3) N N       Cranial Nerves Visual acuity and visual fields are intact  Extraocular muscles are intact  Facial sensation is intact bilaterally  Facial strength is intact bilaterally  Hearing is normal as tested by [place] conversation Palate elevates midline, normal phonation  Shoulder shrug strength is intact  Tongue protrudes midline    SOMATOSENSORY:         Sensation           Intact      Diminished         Absent  Light touch Normal      COORDINATION: Finger to Nose: Normal Heel to Shin: Normal Pronator Drift: Negative Rapid  Alternating Movements: Normal Finger to Thumb Opposition: Normal  MUSCULOSKELETAL SCREEN: Cervical Spine ROM: Limited but painless in all directions   ROM: Grossly WFL BUE/BLE  MMT:  Grossly WFL BUE/BLE  Functional Mobility: Ambulates with single point cane since onset of symptoms  POSTURAL CONTROL TESTS:   Clinical Test of Sensory Interaction for Balance    (CTSIB): Deferred  OCULOMOTOR / VESTIBULAR TESTING:  Oculomotor Exam- Room Light  Findings Comments  Ocular Alignment normal  Ocular ROM normal   Spontaneous Nystagmus normal   Gaze-Holding Nystagmus abnormal Pure L horizontal beating nystagmus with L gaze, not visible at midline or with R gaze  End-Gaze Nystagmus abnormal See above  Vergence (normal 2-3") not examined   Smooth Pursuit abnormal Saccadic  Cross-Cover Test not examined   Saccades abnormal Appear normal horizontally but slow and hypometric vertically  VOR Cancellation normal   Left Head Impulse normal   Right Head Impulse normal   Static Acuity not examined   Dynamic Acuity not examined     Oculomotor Exam- Fixation Suppressed:  L horizontal beating spontaneous nystagmus   BPPV TESTS:  Symptoms Duration Intensity Nystagmus  L Dix-Hallpike "I'm drowning"  8-10s 9/10 R horizontal, possibly downbeating component  R Dix-Hallpike Dizziness >26mn 5/10 L pure horizontal nystagmus  L Head Roll None >1 min None Apogeotrophic (R) horizontal beating nystagmus  R Head Roll None >1 min 4/10 Apogeotrophic (L) horizontal beating nystagmus  L Sidelying Test      R Sidelying Test       Roll test performed with IR goggles on after initial attempt without goggles as it is easier to observe her nystagmus    FUNCTIONAL OUTCOME MEASURES   Results Comments  FOTO 76 Predicted improvement to 83  ABC Scale 61.3%   DHI 24/100                      [facility] PT Assessment - 05/16/20 1657      Assessment   Medical Diagnosis  Dizziness and giddiness     Referring Provider (PT)  Dr. MTami Ribas   Onset Date/Surgical Date  03/01/20    Next MD Visit  Not reported    Prior Therapy  Previously treated for BPPV      Precautions   Precautions  Fall      Restrictions   Weight Bearing Restrictions  No      [place]    Living Arrangements  Alone      Prior Function   Level of Independence  Independent      Cognition   Overall Cognitive Status  Within Functional Limits for tasks assessed                  Objective measurements completed on examination: See above findings.              PT Education - 05/16/20 1658    Education Details  [facility]    Person(s) Educated  Patient    Methods  Explanation;Handout    Comprehension  Verbalized understanding       PT Short Term Goals - 05/16/20 1710      PT SHORT TERM GOAL #1   Title  Pt will be independent with HEP in order to improve strength and balance in order to decrease dizziness and fall risk and improve function at home.    Time  4    Period  Weeks    Status  New    Target Date  06/13/20        PT Long Term Goals - 05/16/20 1711      PT LONG TERM GOAL #1   Title  Pt will improve ABC by at least 13% in order to demonstrate clinically significant improvement in balance confidence.    Baseline  05/15/20: 61.3%    Time  8    Period  Weeks    Status  New    Target Date  07/10/20      PT LONG TERM GOAL #2   Title  Pt will decrease DHI score by at least 18 points in order to demonstrate clinically significant reduction in disability    Baseline  05/15/20: 24/100    Time  8    Period  Weeks    Status  New    Target Date  07/10/20      PT LONG TERM GOAL #3   Title  Pt will improve FOTO to at least 83 in order to demonstrate significant improvement in function    Baseline  05/15/20: 76    Time  8    Period  Weeks    Status  New    Target Date  07/10/20      PT LONG TERM  GOAL #4   Title  Pt will report no further episodes of vertigo with laying down, getting up, or rolling over in bed    Time  8    Period  Weeks    Status  New    Target Date  07/10/20             Plan - 05/16/20 1707    Clinical Impression Statement  Pt is a pleasant 86 year-old female referred for dizziness. VNG study at [facility] revealed a 60% R caloric weakness and L horizontal canal BPPV. Pt presents today with L horizontal beating nystagmus with L gaze, not visible at midline or with R gaze. HIT appears normal bilaterally. She is very unsteady with impaired balance walking into clinic. Saccadic intrusions during smooth pursuit testing and hypometric/slow vertical saccades during testing. Roll test is positive for apogeotrophic pure horizontal nystagmus on both sides which lasts longer than 1 minute. More easily observed with infrared goggles but present in room light. Nystagmus is stronger when testing the R side as is patient's subjective sense of vertigo indicating likely L horizontal canal cupulolithiasis. Deferred initiating BPPV treatment until next week as pt lives alone and drove herself to therapy. She will return next week and will have a plan for assistance returning home as needed if unsteadiness worsens after treatment. Once BPPV successfully treated will continue with additional testing and treatment for [facility]. Pt will benefit from skilled PT services to address deficits in balance and dizziness.    Personal Factors and Comorbidities  Age;Comorbidity 2    Comorbidities  HTN, breast CA, sleep disturbance    Examination-Activity Limitations  Bed Mobility;Bend;Carry;Reach Overhead;Transfers    Examination-Participation Restrictions  Community Activity;Driving;Volunteer    Stability/Clinical Decision Making  Unstable/Unpredictable    Clinical Decision Making  Moderate    Rehab Potential  Good    PT Frequency  1x / week    PT Duration  8 weeks    PT Treatment/Interventions   ADLs/Self Care Home Management;Aquatic Therapy;Biofeedback;Canalith Repostioning;Cryotherapy;Electrical Stimulation;Iontophoresis '4mg'$ /ml Dexamethasone;Moist Heat;Traction;Ultrasound;DME Instruction;Gait training;Stair training;Functional mobility training;Therapeutic activities;Therapeutic exercise;Balance training;Neuromuscular re-education;Cognitive remediation;Patient/family education;Manual techniques;Passive range of motion;Dry needling;Vestibular;Joint Manipulations    PT Next Visit Plan  Recheck roll test and treat with Gufoni maneuver for presumed L horizontal cupulolithiasis, once clear complete DGI/FGA/BERG and initiate VRT as well as balance therapy    PT Home Exercise Plan  None currently    Consulted and Agree with Plan of Care  Patient       Patient will benefit from skilled therapeutic intervention in order to improve the following deficits and impairments:  Decreased balance, Dizziness  Visit Diagnosis: Dizziness and giddiness  Unsteadiness on feet     Problem List Patient Active Problem List   Diagnosis Date Noted  . Lichen sclerosus 55/21/7471  . Actinic keratosis 09/21/2018  . Malignant neoplasm of upper-inner quadrant of right breast in female, estrogen receptor negative ([place]) 07/11/2018  . Benign essential HTN 09/08/2017  . Adult BMI 30+ 07/06/2015  . Hemorrhoid 07/06/2015  . Neutropenia ([place]) 07/06/2015  . Disorder of peripheral nervous system 07/06/2015  . Avitaminosis D 07/06/2015  . Vaginal atrophy 07/06/2015  . Abnormal LFTs 08/21/2009  . Cannot sleep 05/23/2000  . Hypertriglyceridemia 05/19/2000   Phillips Grout PT, DPT, GCS  Alayia Meggison 05/16/2020, 5:20 PM  [place] MAIN [facility] SERVICES [address] [place], [place], [address] Phone: [phone]   Fax:  [phone]  Name: GREENLEIGH KAUTH MRN: [id number] Date of Birth: [birth year]

## 2020-05-16 NOTE — Addendum Note (Signed)
Addended by: Roxana Hires D on: 05/16/2020 05:22 PM   Modules accepted: Orders

## 2020-05-19 ENCOUNTER — Ambulatory Visit: Payer: PPO | Attending: Unknown Physician Specialty | Admitting: Physical Therapy

## 2020-05-19 ENCOUNTER — Other Ambulatory Visit: Payer: Self-pay

## 2020-05-19 VITALS — BP 154/79

## 2020-05-19 DIAGNOSIS — R42 Dizziness and giddiness: Secondary | ICD-10-CM | POA: Diagnosis not present

## 2020-05-19 DIAGNOSIS — R2681 Unsteadiness on feet: Secondary | ICD-10-CM

## 2020-05-19 NOTE — Therapy (Signed)
[place] MAIN [facility] SERVICES [place], [place], [address] Phone: [phone]   Fax:  [phone]  Physical Therapy Treatment  Patient Details  Name: Kelly Clark MRN: [id number] Date of Birth: 24-Oct-1929 Referring Provider (PT): Dr. Tami Ribas   Encounter Date: 05/19/2020  PT End of Session - 05/20/20 1413    Visit Number  2    Number of Visits  9    Date for PT Re-Evaluation  07/10/20    Authorization Type  eval: 05/15/20, FOTO COMPLETED    PT Start Time  1116    PT Stop Time  1200    PT Time Calculation (min)  44 min    Activity Tolerance  Patient tolerated treatment well    Behavior During Therapy  [facility] for tasks assessed/performed       Past Medical History:  Diagnosis Date  . Basal cell carcinoma 2019   RIGHT THUMB REGION  . Breast cancer ([place]) 06/27/2018   RIGHT  . Cancer ([place]) 1977   melanoma . SURGERY RIGHT ARM  . Hemorrhoids   . Hypertension   . Hypertriglyceridemia   . Insomnia   . Menopausal disorder   . Neutropenia ([place])   . Paresthesia   . Peripheral neuropathy   . Tinea corporis   . Vitamin D deficiency     Past Surgical History:  Procedure Laterality Date  . ABDOMINAL HYSTERECTOMY  1977  . BREAST BIOPSY Bilateral 1977   benign  . BREAST BIOPSY Right 06/27/2018   INVASIVE MAMMARY CARCINOMA, NO SPECIAL TYPE. 2 oclock ER/PR negative HER2 positive  . BREAST EXCISIONAL BIOPSY Right 07/23/2018   lumpectomy  . BREAST LUMPECTOMY WITH SENTINEL LYMPH NODE BIOPSY Right 07/23/2018   6 mm ER/PR negative, Her 2 neu 3+; node negative.  Surgeon: Robert Bellow, MD;  Location: [facility] ORS;  Service: General;  Laterality: Right;  . COLONOSCOPY  2007  . ELECTROCARDIOGRAM    . EYE SURGERY Bilateral    CATARACT EXTRACTIONS    Vitals:   05/19/20 1202  BP: (!) 154/79    Subjective Assessment - 05/19/20 1118    Subjective  patient states yesterday was her best day. Patient reports Sunday was a bad day. Patient  states yesterday she was excited because she could walk across the room and had less dizziness.    Pertinent History  Pt reports that her dizziness symptoms began 03/01/20. She got up from her bed and she states that she started spinning. Feels like she is being pulled to the right or falling backwards. She underwent VNG testing at [facility] on 04/08/20 with abnormal saccades and smooth pursuit. She had a 60% caloric weakness in the right ear. Positive for L horizontal canal BPPV during VNG per report but not treated. She had a follow-up with Dr. Tami Ribas who referred her for VRT.    Limitations  Walking    Diagnostic tests  MRI ordered but not yet performed    Patient Stated Goals  Decrease dizziness and improve her walking      Patient reports she had sudden onset vertigo when she was getting up at her daughter's house in [place]. Patient states she had to hold onto the walls to use the restroom. Patient states she had to have people escort her onto/off the plan to get home. Patient reports May 25th was the day she came back from [place]. Patient states she is healthy otherwise.  Patient with red left eye. Patient states she did  not notice it this morning. Patient's blood pressure was 154/79 mmHg and patient reports that she did take her blood pressure medication this morning. Discussed with patient and recommended that patient call her eye doctor today to report this issue. Patient in agreement.   Canalith Repositioning Maneuver: On mat table, performed Gufoni maneuver for left horizontal canal BPPV cupulolithiasis. 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  Performed 3 Gufoni maneuvers in total with short seated rest breaks in-between maneuvers.  Patient reported 7/10 with first maneuver, 4/10 with second and 6/10 with last maneuver.    PT Education - 05/20/20 1412    Education Details  discussed BPPV and  Gufoni maneuver; recommened that patient call her eye doctor in regards to right eye redness noted this date and patient in agreement to contact her doctor today    Person(s) Educated  Patient    Methods  Explanation    Comprehension  Verbalized understanding       PT Short Term Goals - 05/16/20 1710      PT SHORT TERM GOAL #1   Title  Pt will be independent with HEP in order to improve strength and balance in order to decrease dizziness and fall risk and improve function at home.    Time  4    Period  Weeks    Status  New    Target Date  06/13/20        PT Long Term Goals - 05/16/20 1711      PT LONG TERM GOAL #1   Title  Pt will improve ABC by at least 13% in order to demonstrate clinically significant improvement in balance confidence.    Baseline  05/15/20: 61.3%    Time  8    Period  Weeks    Status  New    Target Date  07/10/20      PT LONG TERM GOAL #2   Title  Pt will decrease DHI score by at least 18 points in order to demonstrate clinically significant reduction in disability    Baseline  05/15/20: 24/100    Time  8    Period  Weeks    Status  New    Target Date  07/10/20      PT LONG TERM GOAL #3   Title  Pt will improve FOTO to at least 83 in order to demonstrate significant improvement in function    Baseline  05/15/20: 76    Time  8    Period  Weeks    Status  New    Target Date  07/10/20      PT LONG TERM GOAL #4   Title  Pt will report no further episodes of vertigo with laying down, getting up, or rolling over in bed    Time  8    Period  Weeks    Status  New    Target Date  07/10/20         Plan - 05/22/20 0909    Clinical Impression Statement  Patient presented to clinic with reports of continued vertiginous episodes. Patient with apogeotrophic nystagmus noted in left sidelying position. Performed three Gufoni maneuvers to try to obtain clearance of particles. Will plan on retesting canals next session and performing treatment maneuvers for BPPV  if indicated. Patient would benefit from continued PT services to further address goals and deficits.    Personal Factors and Comorbidities  Age;Comorbidity 2  Comorbidities  HTN, breast CA, sleep disturbance    Examination-Activity Limitations  Bed Mobility;Bend;Carry;Reach Overhead;Transfers    Examination-Participation Restrictions  Community Activity;Driving;Volunteer    Stability/Clinical Decision Making  Unstable/Unpredictable    Rehab Potential  Good    PT Frequency  1x / week    PT Duration  8 weeks    PT Treatment/Interventions  ADLs/Self Care Home Management;Aquatic Therapy;Biofeedback;Canalith Repostioning;Cryotherapy;Electrical Stimulation;Iontophoresis '4mg'$ /ml Dexamethasone;Moist Heat;Traction;Ultrasound;DME Instruction;Gait training;Stair training;Functional mobility training;Therapeutic activities;Therapeutic exercise;Balance training;Neuromuscular re-education;Cognitive remediation;Patient/family education;Manual techniques;Passive range of motion;Dry needling;Vestibular;Joint Manipulations    PT Next Visit Plan  Recheck roll test and treat with Gufoni maneuver for presumed L horizontal cupulolithiasis, once clear complete DGI/FGA/BERG and initiate VRT as well as balance therapy    PT Home Exercise Plan  None currently    Consulted and Agree with Plan of Care  Patient             Patient will benefit from skilled therapeutic intervention in order to improve the following deficits and impairments:     Visit Diagnosis: Dizziness and giddiness  Unsteadiness on feet     Problem List Patient Active Problem List   Diagnosis Date Noted  . Lichen sclerosus 50/35/4656  . Actinic keratosis 09/21/2018  . Malignant neoplasm of upper-inner quadrant of right breast in female, estrogen receptor negative ([place]) 07/11/2018  . Benign essential HTN 09/08/2017  . Adult BMI 30+ 07/06/2015  . Hemorrhoid 07/06/2015  . Neutropenia ([place]) 07/06/2015  . Disorder of peripheral nervous  system 07/06/2015  . Avitaminosis D 07/06/2015  . Vaginal atrophy 07/06/2015  . Abnormal LFTs 08/21/2009  . Cannot sleep 05/23/2000  . Hypertriglyceridemia 05/19/2000   Lady Deutscher PT, DPT [phone] Lady Deutscher 05/20/2020, 2:15 PM  [place] [facility] MAIN [facility] [address] [place], [place], [address] Phone: [phone]   Fax:  [phone]  Name: Kelly Clark MRN: [id number] Date of Birth: 1929/01/19

## 2020-05-20 ENCOUNTER — Encounter: Payer: Self-pay | Admitting: Physical Therapy

## 2020-05-20 DIAGNOSIS — H1131 Conjunctival hemorrhage, right eye: Secondary | ICD-10-CM | POA: Diagnosis not present

## 2020-05-26 ENCOUNTER — Ambulatory Visit: Payer: PPO | Admitting: Physical Therapy

## 2020-05-26 ENCOUNTER — Other Ambulatory Visit: Payer: Self-pay

## 2020-05-26 ENCOUNTER — Ambulatory Visit: Payer: Self-pay | Admitting: Family Medicine

## 2020-05-26 ENCOUNTER — Encounter: Payer: Self-pay | Admitting: Physical Therapy

## 2020-05-26 DIAGNOSIS — R42 Dizziness and giddiness: Secondary | ICD-10-CM

## 2020-05-26 DIAGNOSIS — R2681 Unsteadiness on feet: Secondary | ICD-10-CM

## 2020-05-26 NOTE — Therapy (Signed)
[place] MAIN [facility] SERVICES [address] [place], [place], [address] Phone: [phone]   Fax:  [phone]  Physical Therapy Treatment  Patient Details  Name: Kelly Clark MRN: [id number] Date of Birth: 12-11-1929 Referring Provider (PT): Dr. Tami Ribas   Encounter Date: 05/26/2020    Past Medical History:  Diagnosis Date  . Basal cell carcinoma 2019   RIGHT THUMB REGION  . Breast cancer ([place]) 06/27/2018   RIGHT  . Cancer ([place]) 1977   melanoma . SURGERY RIGHT ARM  . Hemorrhoids   . Hypertension   . Hypertriglyceridemia   . Insomnia   . Menopausal disorder   . Neutropenia ([place])   . Paresthesia   . Peripheral neuropathy   . Tinea corporis   . Vitamin D deficiency     Past Surgical History:  Procedure Laterality Date  . ABDOMINAL HYSTERECTOMY  1977  . BREAST BIOPSY Bilateral 1977   benign  . BREAST BIOPSY Right 06/27/2018   INVASIVE MAMMARY CARCINOMA, NO SPECIAL TYPE. 2 oclock ER/PR negative HER2 positive  . BREAST EXCISIONAL BIOPSY Right 07/23/2018   lumpectomy  . BREAST LUMPECTOMY WITH SENTINEL LYMPH NODE BIOPSY Right 07/23/2018   6 mm ER/PR negative, Her 2 neu 3+; node negative.  Surgeon: Robert Bellow, MD;  Location: [facility] ORS;  Service: General;  Laterality: Right;  . COLONOSCOPY  2007  . ELECTROCARDIOGRAM    . EYE SURGERY Bilateral    CATARACT EXTRACTIONS    There were no vitals filed for this visit.  Subjective Assessment - 05/26/20 1112    Subjective  Patient reports that two days out of the past week were okay but that she has had dizziness and Sunday was a bad day. Patient is hoping that the dizziness will improve and that her walking will get better.    Pertinent History  Pt reports that her dizziness symptoms began 03/01/20. She got up from her bed and she states that she started spinning. Feels like she is being pulled to the right or falling backwards. She underwent VNG testing at [facility] on 04/08/20 with  abnormal saccades and smooth pursuit. She had a 60% caloric weakness in the right ear. Positive for L horizontal canal BPPV during VNG per report but not treated. She had a follow-up with Dr. Tami Ribas who referred her for VRT.    Limitations  Walking    Diagnostic tests  MRI ordered but not yet performed    Patient Stated Goals  Decrease dizziness and improve her walking       Patient reports that she has been getting a "heavy sensation" or "lightheadedness" to describe her dizziness symptoms. Patient states she can sit or lie down, but getting up and walking is bad.   Canal Repositioning Maneuver: On mat table, performed left Gufoni maneuver and noted apogeotrophic nystagmus in head rotated position. Repeated left Gufoni maneuvers for a total of 3 maneuvers with retesting between maneuvers. Patient reported 4/10 vertigo with first and 2-3/10 vertigo with second maneuver. Issued Gufoni maneuver for home program.   Patient apogeotrophic nystagmus noted again this date. Performed three Gufoni maneuvers to try to obtain clearance of particles. Will plan on retesting canals next session and performing treatment maneuvers for BPPV if indicated. Patient would benefit from continued PT services to further address goals and deficits.     PT Short Term Goals - 05/16/20 1710      PT SHORT TERM GOAL #1   Title  Pt will be  independent with HEP in order to improve strength and balance in order to decrease dizziness and fall risk and improve function at home.    Time  4    Period  Weeks    Status  New    Target Date  06/13/20        PT Long Term Goals - 05/16/20 1711      PT LONG TERM GOAL #1   Title  Pt will improve ABC by at least 13% in order to demonstrate clinically significant improvement in balance confidence.    Baseline  05/15/20: 61.3%    Time  8    Period  Weeks    Status  New    Target Date  07/10/20      PT LONG TERM GOAL #2   Title  Pt will decrease DHI score by at least 18  points in order to demonstrate clinically significant reduction in disability    Baseline  05/15/20: 24/100    Time  8    Period  Weeks    Status  New    Target Date  07/10/20      PT LONG TERM GOAL #3   Title  Pt will improve FOTO to at least 83 in order to demonstrate significant improvement in function    Baseline  05/15/20: 76    Time  8    Period  Weeks    Status  New    Target Date  07/10/20      PT LONG TERM GOAL #4   Title  Pt will report no further episodes of vertigo with laying down, getting up, or rolling over in bed    Time  8    Period  Weeks    Status  New    Target Date  07/10/20           Plan - 05/29/20 1526    Clinical Impression Statement Patient apogeotrophic nystagmus noted again this date. Performed three Gufoni maneuvers to try to obtain clearance of particles. Will plan on retesting canals next session and performing treatment maneuvers for BPPV if indicated. Patient would benefit from continued PT services to further address goals and deficits.    Personal Factors and Comorbidities Age;Comorbidity 2    Comorbidities HTN, breast CA, sleep disturbance    Examination-Activity Limitations Bed Mobility;Bend;Carry;Reach Overhead;Transfers    Examination-Participation Restrictions Community Activity;Driving;Volunteer    Stability/Clinical Decision Making Unstable/Unpredictable    Rehab Potential Good    PT Frequency 1x / week    PT Duration 8 weeks    PT Treatment/Interventions ADLs/Self Care Home Management;Aquatic Therapy;Biofeedback;Canalith Repostioning;Cryotherapy;Electrical Stimulation;Iontophoresis '4mg'$ /ml Dexamethasone;Moist Heat;Traction;Ultrasound;DME Instruction;Gait training;Stair training;Functional mobility training;Therapeutic activities;Therapeutic exercise;Balance training;Neuromuscular re-education;Cognitive remediation;Patient/family education;Manual techniques;Passive range of motion;Dry needling;Vestibular;Joint Manipulations    PT Next Visit  Plan Recheck roll test and treat with Gufoni maneuver for presumed L horizontal cupulolithiasis, once clear complete DGI/FGA/BERG and initiate VRT as well as balance therapy    PT Home Exercise Plan None currently    Consulted and Agree with Plan of Care Patient            Patient will benefit from skilled therapeutic intervention in order to improve the following deficits and impairments:     Visit Diagnosis: Dizziness and giddiness  Unsteadiness on feet     Problem List Patient Active Problem List   Diagnosis Date Noted  . Lichen sclerosus 88/28/0034  . Actinic keratosis 09/21/2018  . Malignant neoplasm of upper-inner quadrant of right breast in female,  estrogen receptor negative ([place]) 07/11/2018  . Benign essential HTN 09/08/2017  . Adult BMI 30+ 07/06/2015  . Hemorrhoid 07/06/2015  . Neutropenia ([place]) 07/06/2015  . Disorder of peripheral nervous system 07/06/2015  . Avitaminosis D 07/06/2015  . Vaginal atrophy 07/06/2015  . Abnormal LFTs 08/21/2009  . Cannot sleep 05/23/2000  . Hypertriglyceridemia 05/19/2000   Lady Deutscher PT, DPT [phone]  Lady Deutscher 05/26/2020, 11:13 AM  [place] MAIN [facility] [address] [place], [place], [address] Phone: [phone]   Fax:  [phone]  Name: DONEEN OLLINGER MRN: [id number] Date of Birth: [birth year]

## 2020-05-29 ENCOUNTER — Inpatient Hospital Stay: Admit: 2020-05-29 | Payer: PPO

## 2020-05-29 NOTE — Progress Notes (Deleted)
     Established patient visit   Patient: Kelly Clark   DOB: 1929-09-17   86 y.o. Female  MRN: [id number] Visit Date: 06/03/2020  Today's healthcare provider: Wilhemena Durie, MD   No chief complaint on file.  Subjective    HPI  ***   Leg swelling- 05/13/20 Recommended support hose. - EKG 12-Lead Dizziness She is having significant gait and balance issues with this.  This could be managed vertigo which have recommended taking 3 times a day meclizine.  MRI of the brain to rule out CVA with her gait abnormality in the office.  I will see her back in 1 to 2 weeks. - MR Brain W Wo Contrast {Show patient history (optional):23778::" "}   Medications: Outpatient Medications Prior to Visit  Medication Sig  . amLODipine (NORVASC) 10 MG tablet TAKE 1 TABLET(10 MG) BY MOUTH DAILY  . Cholecalciferol (VITAMIN D3) 10000 units TABS Take 10,000 Units by mouth at bedtime.  . clobetasol ointment (TEMOVATE) 6.83 % Apply 1 application topically once a week. Applied to affected area of vagina and rectum   . gabapentin (NEURONTIN) 600 MG tablet TAKE 1 TABLET BY MOUTH AT BEDTIME  . Ginkgo Biloba 40 MG TABS Take 1 tablet by mouth daily. @@ noon  . hydrochlorothiazide (HYDRODIURIL) 12.5 MG tablet TAKE 1 TABLET BY MOUTH ONCE DAILY  . latanoprost (XALATAN) 0.005 % ophthalmic solution Place 1 drop into both eyes at bedtime.   . Multiple Vitamin (MULTIVITAMIN) tablet Take 1 tablet by mouth daily.  . multivitamin-lutein (OCUVITE-LUTEIN) CAPS capsule Take 1 capsule by mouth every morning.   . polyethylene glycol powder (MIRALAX) powder Take 17 g by mouth at bedtime.   Marland Kitchen RESVERATROL-GRAPE PO Take 1 capsule by mouth daily. CoQ10 Resveratrol & Grape Seed   No facility-administered medications prior to visit.    Review of Systems  {Heme  Chem  Endocrine  Serology  Results Review (optional):23779::" "}  Objective    There were no vitals taken for this visit. {Show previous vital signs  (optional):23777::" "}  Physical Exam  ***  No results found for any visits on 06/03/20.  Assessment & Plan     ***  No follow-ups on file.      {provider attestation***:1}   Wilhemena Durie, MD  [facility] [phone] (phone) [phone] (fax)  [place]

## 2020-05-31 ENCOUNTER — Ambulatory Visit
Admission: RE | Admit: 2020-05-31 | Discharge: 2020-05-31 | Disposition: A | Discharge status: Home / Self Care | Payer: PPO | Source: Ambulatory Visit | Attending: Family Medicine | Admitting: Family Medicine

## 2020-05-31 ENCOUNTER — Other Ambulatory Visit: Payer: Self-pay

## 2020-05-31 DIAGNOSIS — R42 Dizziness and giddiness: Secondary | ICD-10-CM | POA: Diagnosis not present

## 2020-05-31 MED ORDER — GADOBUTROL 1 MMOL/ML IV SOLN
7.5000 mL | Freq: Once | INTRAVENOUS | Status: AC | PRN
  Administered 2020-05-31: 7.5 mL via INTRAVENOUS

## 2020-06-02 ENCOUNTER — Ambulatory Visit: Payer: PPO | Admitting: Physical Therapy

## 2020-06-02 ENCOUNTER — Other Ambulatory Visit: Payer: Self-pay

## 2020-06-02 ENCOUNTER — Encounter: Payer: Self-pay | Admitting: Physical Therapy

## 2020-06-02 ENCOUNTER — Telehealth: Payer: Self-pay

## 2020-06-02 DIAGNOSIS — R42 Dizziness and giddiness: Secondary | ICD-10-CM

## 2020-06-02 DIAGNOSIS — R2681 Unsteadiness on feet: Secondary | ICD-10-CM

## 2020-06-02 DIAGNOSIS — H40153 Residual stage of open-angle glaucoma, bilateral: Secondary | ICD-10-CM | POA: Diagnosis not present

## 2020-06-02 NOTE — Therapy (Signed)
[place] MAIN [facility] SERVICES [place], [place], [address] Phone: [phone]   Fax:  [phone]  Physical Therapy Treatment  Patient Details  Name: Kelly Clark MRN: [id number] Date of Birth: Aug 15, 1929 Referring Provider (PT): Dr. Tami Ribas   Encounter Date: 06/02/2020   PT End of Session - 06/02/20 1115    Visit Number 4    Number of Visits 9    Date for PT Re-Evaluation 07/10/20    Authorization Type eval: 05/15/20, FOTO COMPLETED    PT Start Time 1118    PT Stop Time 1150    PT Time Calculation (min) 32 min    Activity Tolerance Patient tolerated treatment well    Behavior During Therapy [facility] for tasks assessed/performed           Past Medical History:  Diagnosis Date  . Basal cell carcinoma 2019   RIGHT THUMB REGION  . Breast cancer ([place]) 06/27/2018   RIGHT  . Cancer ([place]) 1977   melanoma . SURGERY RIGHT ARM  . Hemorrhoids   . Hypertension   . Hypertriglyceridemia   . Insomnia   . Menopausal disorder   . Neutropenia ([place])   . Paresthesia   . Peripheral neuropathy   . Tinea corporis   . Vitamin D deficiency     Past Surgical History:  Procedure Laterality Date  . ABDOMINAL HYSTERECTOMY  1977  . BREAST BIOPSY Bilateral 1977   benign  . BREAST BIOPSY Right 06/27/2018   INVASIVE MAMMARY CARCINOMA, NO SPECIAL TYPE. 2 oclock ER/PR negative HER2 positive  . BREAST EXCISIONAL BIOPSY Right 07/23/2018   lumpectomy  . BREAST LUMPECTOMY WITH SENTINEL LYMPH NODE BIOPSY Right 07/23/2018   6 mm ER/PR negative, Her 2 neu 3+; node negative.  Surgeon: Robert Bellow, MD;  Location: [facility] ORS;  Service: General;  Laterality: Right;  . COLONOSCOPY  2007  . ELECTROCARDIOGRAM    . EYE SURGERY Bilateral    CATARACT EXTRACTIONS    There were no vitals filed for this visit.   Subjective Assessment - 06/02/20 1342    Subjective Patient states that she has not had any major episodes of lightheadedness or vertigo since  last visit. Patient states that today is her best day yet and that she is doing a little better overall.    Pertinent History Pt reports that her dizziness symptoms began 03/01/20. She got up from her bed and she states that she started spinning. Feels like she is being pulled to the right or falling backwards. She underwent VNG testing at [facility] on 04/08/20 with abnormal saccades and smooth pursuit. She had a 60% caloric weakness in the right ear. Positive for L horizontal canal BPPV during VNG per report but not treated. She had a follow-up with Dr. Tami Ribas who referred her for VRT.    Limitations Walking    Diagnostic tests MRI ordered but not yet performed    Patient Stated Goals Decrease dizziness and improve her walking          Patient reports that she has been doing the maneuvers at home. Patient states that she has not had any major episodes of lightheadedness or vertigo since last visit. Patient states she went for her MRI on Sunday and "all is good". Patient states that today is her best day yet and that she is doing a little better overall.    Canalith Repositioning Maneuver:  BPPV TESTS:  Symptoms Duration Intensity Nystagmus  Left Dix-Hallpike vertigo  7/10 Upbeating torsional nystagmus  Right Dix-Hallpike None  N/A None observed  Left Head Roll None  N/A None observed  Right Head Roll None  N/A None observed   Canalith Repositioning Maneuver: On mat table, performed left Dix-Hallpike testing and it was positive with left upbeating torsional nystagmus of short duration without latency. Performed left canalith repositioning maneuver (CRT). Repeated left CRT for a total of 3 maneuvers with retesting between maneuvers. Patient reported 7/10 vertigo with first maneuver and 2-3/10 with last maneuver.     PT Education - 06/02/20 1333    Education Details discussed canal testing and Epley maneuver; discussed plan of care    Person(s) Educated Patient    Methods  Explanation;Verbal cues    Comprehension Verbalized understanding            PT Short Term Goals - 05/16/20 1710      PT SHORT TERM GOAL #1   Title Pt will be independent with HEP in order to improve strength and balance in order to decrease dizziness and fall risk and improve function at home.    Time 4    Period Weeks    Status New    Target Date 06/13/20             PT Long Term Goals - 05/16/20 1711      PT LONG TERM GOAL #1   Title Pt will improve ABC by at least 13% in order to demonstrate clinically significant improvement in balance confidence.    Baseline 05/15/20: 61.3%    Time 8    Period Weeks    Status New    Target Date 07/10/20      PT LONG TERM GOAL #2   Title Pt will decrease DHI score by at least 18 points in order to demonstrate clinically significant reduction in disability    Baseline 05/15/20: 24/100    Time 8    Period Weeks    Status New    Target Date 07/10/20      PT LONG TERM GOAL #3   Title Pt will improve FOTO to at least 83 in order to demonstrate significant improvement in function    Baseline 05/15/20: 76    Time 8    Period Weeks    Status New    Target Date 07/10/20      PT LONG TERM GOAL #4   Title Pt will report no further episodes of vertigo with laying down, getting up, or rolling over in bed    Time 8    Period Weeks    Status New    Target Date 07/10/20                 Plan - 06/02/20 1333    Clinical Impression Statement Patient reports some improvement in her dizziness symptoms. Patient with negative horizontal canal testing this date, but positive left Dix-hallpike test. Performed Epley maneuvers and will plan on retesting next session and treating if indicated. Plan on doing VORX1 after particle clearance is obtained.    Personal Factors and Comorbidities Age;Comorbidity 2    Comorbidities HTN, breast CA, sleep disturbance    Examination-Activity Limitations Bed Mobility;Bend;Carry;Reach Overhead;Transfers     Examination-Participation Restrictions Community Activity;Driving;Volunteer    Stability/Clinical Decision Making Unstable/Unpredictable    Rehab Potential Good    PT Frequency 1x / week    PT Duration 8 weeks    PT Treatment/Interventions ADLs/Self Care Home Management;Aquatic Therapy;Biofeedback;Canalith Repostioning;Cryotherapy;Electrical Stimulation;Iontophoresis 50m/ml Dexamethasone;Moist Heat;Traction;Ultrasound;DME Instruction;Gait  training;Stair training;Functional mobility training;Therapeutic activities;Therapeutic exercise;Balance training;Neuromuscular re-education;Cognitive remediation;Patient/family education;Manual techniques;Passive range of motion;Dry needling;Vestibular;Joint Manipulations    PT Next Visit Plan Recheck roll test and treat with Gufoni maneuver for presumed L horizontal cupulolithiasis, once clear complete DGI/FGA/BERG and initiate VRT as well as balance therapy    PT Home Exercise Plan None currently    Consulted and Agree with Plan of Care Patient           Patient will benefit from skilled therapeutic intervention in order to improve the following deficits and impairments:  Decreased balance, Dizziness  Visit Diagnosis: Dizziness and giddiness  Unsteadiness on feet     Problem List Patient Active Problem List   Diagnosis Date Noted  . Lichen sclerosus 93/10/2161  . Actinic keratosis 09/21/2018  . Malignant neoplasm of upper-inner quadrant of right breast in female, estrogen receptor negative ([place]) 07/11/2018  . Benign essential HTN 09/08/2017  . Adult BMI 30+ 07/06/2015  . Hemorrhoid 07/06/2015  . Neutropenia ([place]) 07/06/2015  . Disorder of peripheral nervous system 07/06/2015  . Avitaminosis D 07/06/2015  . Vaginal atrophy 07/06/2015  . Abnormal LFTs 08/21/2009  . Cannot sleep 05/23/2000  . Hypertriglyceridemia 05/19/2000   Lady Deutscher PT, DPT [phone] Lady Deutscher 06/02/2020, 1:44 PM  [place]  MAIN [facility] [address] [place], [place], [address] Phone: [phone]   Fax:  [phone]  Name: Kelly Clark MRN: [id number] Date of Birth: [birth year]

## 2020-06-02 NOTE — Telephone Encounter (Signed)
Pt called stating that she has already received the results, but that she wants to touch base with nurse regarding any extra steps that she may need to take. Please advise.

## 2020-06-02 NOTE — Telephone Encounter (Signed)
No, just routine follow-up at this time.

## 2020-06-02 NOTE — Telephone Encounter (Signed)
Patient advised.

## 2020-06-02 NOTE — Telephone Encounter (Signed)
-----   Message from [name]., MD sent at 06/02/2020 10:11 AM EDT ----- Brain MRI is normal.

## 2020-06-02 NOTE — Telephone Encounter (Signed)
Called to advise patient of MRI results, LVMTCB. If patient calls back it is okay for someone else to advise patient of results.

## 2020-06-03 ENCOUNTER — Ambulatory Visit: Payer: Self-pay | Admitting: Family Medicine

## 2020-06-09 ENCOUNTER — Encounter: Payer: Self-pay | Admitting: Physical Therapy

## 2020-06-09 ENCOUNTER — Ambulatory Visit: Payer: PPO | Admitting: Physical Therapy

## 2020-06-09 ENCOUNTER — Other Ambulatory Visit: Payer: Self-pay

## 2020-06-09 DIAGNOSIS — R42 Dizziness and giddiness: Secondary | ICD-10-CM | POA: Diagnosis not present

## 2020-06-09 DIAGNOSIS — R2681 Unsteadiness on feet: Secondary | ICD-10-CM

## 2020-06-09 NOTE — Therapy (Signed)
[place] MAIN [facility] SERVICES [place], [place], [address] Phone: [phone]   Fax:  [phone]  Physical Therapy Treatment  Patient Details  Name: Kelly Clark MRN: [id number] Date of Birth: 06/24/1929 Referring Provider (PT): Dr. Tami Ribas   Encounter Date: 06/09/2020   PT End of Session - 06/09/20 0946    Visit Number 5    Number of Visits 9    Date for PT Re-Evaluation 07/10/20    Authorization Type eval: 05/15/20, FOTO COMPLETED    PT Start Time 0947    PT Stop Time 1035    PT Time Calculation (min) 48 min    Activity Tolerance Patient tolerated treatment well    Behavior During Therapy [facility] for tasks assessed/performed           Past Medical History:  Diagnosis Date  . Basal cell carcinoma 2019   RIGHT THUMB REGION  . Breast cancer ([place]) 06/27/2018   RIGHT  . Cancer ([place]) 1977   melanoma . SURGERY RIGHT ARM  . Hemorrhoids   . Hypertension   . Hypertriglyceridemia   . Insomnia   . Menopausal disorder   . Neutropenia ([place])   . Paresthesia   . Peripheral neuropathy   . Tinea corporis   . Vitamin D deficiency     Past Surgical History:  Procedure Laterality Date  . ABDOMINAL HYSTERECTOMY  1977  . BREAST BIOPSY Bilateral 1977   benign  . BREAST BIOPSY Right 06/27/2018   INVASIVE MAMMARY CARCINOMA, NO SPECIAL TYPE. 2 oclock ER/PR negative HER2 positive  . BREAST EXCISIONAL BIOPSY Right 07/23/2018   lumpectomy  . BREAST LUMPECTOMY WITH SENTINEL LYMPH NODE BIOPSY Right 07/23/2018   6 mm ER/PR negative, Her 2 neu 3+; node negative.  Surgeon: Robert Bellow, MD;  Location: [facility] ORS;  Service: General;  Laterality: Right;  . COLONOSCOPY  2007  . ELECTROCARDIOGRAM    . EYE SURGERY Bilateral    CATARACT EXTRACTIONS    There were no vitals filed for this visit.   Subjective Assessment - 06/09/20 0945    Subjective Patient states on the third day after she is here she does well and then the next day it  comes back, but not has bad as when she first came to the clnic in regards to the vertigo symptoms. Patient reports she is still noticing dizziness when she lies down on the left side at night.    Pertinent History Pt reports that her dizziness symptoms began 03/01/20. She got up from her bed and she states that she started spinning. Feels like she is being pulled to the right or falling backwards. She underwent VNG testing at [facility] on 04/08/20 with abnormal saccades and smooth pursuit. She had a 60% caloric weakness in the right ear. Positive for L horizontal canal BPPV during VNG per report but not treated. She had a follow-up with Dr. Tami Ribas who referred her for VRT.    Limitations Walking    Diagnostic tests MRI ordered but not yet performed    Patient Stated Goals Decrease dizziness and improve her walking          Patient arrives to clinic ambulating with SPC.  Canalith Repositioning Maneuver: On mat table, performed left Dix-Hallpike testing and it was positive with left upbeating torsional nystagmus of short duration without latency. Performed left canalith repositioning maneuver (CRT). Repeated left CRT for a total of 3 maneuvers. Patient did not have nystagmus noted with the second  and third maneuver but performed to try to ensure clearance of particles.  Patient reported 4/10 vertigo with first, no vertigo with second maneuver and 1/10 lightheadedness but no vertigo with last maneuver. Performed 1 minute holds in each position with the first Epley and then 30 second holds for the second and third Epleys since no nystagmus observed and patient denied vertigo.  Note: performed neuromuscular re-education after the canalith repositioning maneuvers.  Neuromuscular Re-education:  VOR X 1 exercise:  Demonstrated and educated as to VOR X1.  Patient performed VOR X 1 horizontal in sitting 3 reps of 1 minute each with verbal cues for technique.  Patient reports the target is staying in  focus and reports mild dizziness.  Added to HEP  Non-compliant/ Firm Surface: On firm surface, patient performed feet together and semi-tandem progressions with alternating lead leg with and without horizontal and vertical head turns multiple 1 minute reps.  Patient   Added to HEP. Discussed safety precautions with performing HEP at home. Demonstrated and discussed standing in corner with chair in front for safety and then patient demonstrated.   Patient presents to clinic stating that initially she did very well at home after performing Epley maneuver in clinic last session, but states that after the third day she began to have dizziness when lying on her left side again. Performed left Dix-Hallpike which was positive and performed 3 Epley maneuvers. Patient without vertigo and no nystagmus observed after the first maneuver. Patient able to begin VOR X 1 and exercises with head turning this date without vertigo. Issued VOR X1 in sitting and feet together and semi-tandem progressions with vertical and horizontal head turns for HEP. Plan on retesting canals next session as well as reviewing HEP and working on progressions per patient tolerance. Patient would benefit from continued PT services to further address goals and functional deficits.     PT Education - 06/09/20 0945    Education Details discussed VOR x 1 in sitting, feet together and semi-tandem progressions with vertical and horizontal head turns and issued for HEP; exercise technique; safety with HEP including standing in corner with chair in front    Person(s) Educated Patient    Methods Explanation;Handout;Verbal cues;Demonstration    Comprehension Verbalized understanding;Returned demonstration            PT Short Term Goals - 05/16/20 1710      PT SHORT TERM GOAL #1   Title Pt will be independent with HEP in order to improve strength and balance in order to decrease dizziness and fall risk and improve function at home.    Time  4    Period Weeks    Status New    Target Date 06/13/20             PT Long Term Goals - 05/16/20 1711      PT LONG TERM GOAL #1   Title Pt will improve ABC by at least 13% in order to demonstrate clinically significant improvement in balance confidence.    Baseline 05/15/20: 61.3%    Time 8    Period Weeks    Status New    Target Date 07/10/20      PT LONG TERM GOAL #2   Title Pt will decrease DHI score by at least 18 points in order to demonstrate clinically significant reduction in disability    Baseline 05/15/20: 24/100    Time 8    Period Weeks    Status New    Target Date  07/10/20      PT LONG TERM GOAL #3   Title Pt will improve FOTO to at least 83 in order to demonstrate significant improvement in function    Baseline 05/15/20: 76    Time 8    Period Weeks    Status New    Target Date 07/10/20      PT LONG TERM GOAL #4   Title Pt will report no further episodes of vertigo with laying down, getting up, or rolling over in bed    Time 8    Period Weeks    Status New    Target Date 07/10/20                 Plan - 06/09/20 1250    Clinical Impression Statement Patient presents to clinic stating that initially she did very well at home after performing Epley maneuver in clinic last session, but states that after the third day she began to have dizziness when lying on her left side again. Performed left Dix-Hallpike which was positive and performed 3 Epley maneuvers. Patient without vertigo and no nystagmus observed after the first maneuver. Patient able to begin VOR X 1 and exercises with head turning this date without vertigo. Issued VOR X1 in sitting and feet together and semi-tandem progressions with vertical and horizontal head turns for HEP. Plan on retesting canals next session as well as reviewing HEP and working on progressions per patient tolerance. Patient would benefit from continued PT services to further address goals and functional deficits.     Personal Factors and Comorbidities Age;Comorbidity 2    Comorbidities HTN, breast CA, sleep disturbance    Examination-Activity Limitations Bed Mobility;Bend;Carry;Reach Overhead;Transfers    Examination-Participation Restrictions Community Activity;Driving;Volunteer    Stability/Clinical Decision Making Unstable/Unpredictable    Rehab Potential Good    PT Frequency 1x / week    PT Duration 8 weeks    PT Treatment/Interventions ADLs/Self Care Home Management;Aquatic Therapy;Biofeedback;Canalith Repostioning;Cryotherapy;Electrical Stimulation;Iontophoresis '4mg'$ /ml Dexamethasone;Moist Heat;Traction;Ultrasound;DME Instruction;Gait training;Stair training;Functional mobility training;Therapeutic activities;Therapeutic exercise;Balance training;Neuromuscular re-education;Cognitive remediation;Patient/family education;Manual techniques;Passive range of motion;Dry needling;Vestibular;Joint Manipulations    PT Next Visit Plan Recheck roll test and treat with Gufoni maneuver for presumed L horizontal cupulolithiasis, once clear complete DGI/FGA/BERG and initiate VRT as well as balance therapy    PT Home Exercise Plan None currently    Consulted and Agree with Plan of Care Patient           Patient will benefit from skilled therapeutic intervention in order to improve the following deficits and impairments:  Decreased balance, Dizziness  Visit Diagnosis: Dizziness and giddiness  Unsteadiness on feet     Problem List Patient Active Problem List   Diagnosis Date Noted  . Lichen sclerosus 63/12/6008  . Actinic keratosis 09/21/2018  . Malignant neoplasm of upper-inner quadrant of right breast in female, estrogen receptor negative ([place]) 07/11/2018  . Benign essential HTN 09/08/2017  . Adult BMI 30+ 07/06/2015  . Hemorrhoid 07/06/2015  . Neutropenia ([place]) 07/06/2015  . Disorder of peripheral nervous system 07/06/2015  . Avitaminosis D 07/06/2015  . Vaginal atrophy 07/06/2015  . Abnormal LFTs  08/21/2009  . Cannot sleep 05/23/2000  . Hypertriglyceridemia 05/19/2000   Lady Deutscher PT, DPT [phone] Lady Deutscher 06/09/2020, 12:50 PM  [place] MAIN [facility] SERVICES [address] [place], [place], [address] Phone: [phone]   Fax:  [phone]  Name: Kelly Clark MRN: [id number] Date of Birth: 1929-01-17

## 2020-06-11 ENCOUNTER — Other Ambulatory Visit: Payer: Self-pay | Admitting: Family Medicine

## 2020-06-12 NOTE — Progress Notes (Signed)
Established patient visit  I,April Miller,acting as a scribe for Wilhemena Durie, MD.,have documented all relevant documentation on the behalf of Wilhemena Durie, MD,as directed by  Wilhemena Durie, MD while in the presence of Wilhemena Durie, MD.   Patient: Kelly Clark   DOB: Jun 20, 1929   86 y.o. Female  MRN: [id number] Visit Date: 06/15/2020  Today's healthcare provider: Wilhemena Durie, MD   Chief Complaint  Patient presents with  . Follow-up   Subjective    HPI  Improving.  Walking carefully.Uses cane.  MRI of brain was normal Leg swelling From 05/13/2020-Recommended support hose.  Dizziness From 05/13/2020-She is having significant gait and balance issues with this.  This could be managed vertigo which have recommended taking 3 times a day meclizine.  MRI of the brain to rule out CVA with her gait abnormality in the office.  I will see her back in 1 to 2 weeks.      Medications: Outpatient Medications Prior to Visit  Medication Sig  . amLODipine (NORVASC) 10 MG tablet TAKE 1 TABLET(10 MG) BY MOUTH DAILY  . Cholecalciferol (VITAMIN D3) 10000 units TABS Take 10,000 Units by mouth at bedtime.  . clobetasol ointment (TEMOVATE) 1.54 % Apply 1 application topically once a week. Applied to affected area of vagina and rectum   . gabapentin (NEURONTIN) 600 MG tablet TAKE 1 TABLET BY MOUTH AT BEDTIME  . Ginkgo Biloba 40 MG TABS Take 1 tablet by mouth daily. @@ noon  . hydrochlorothiazide (HYDRODIURIL) 12.5 MG tablet TAKE 1 TABLET BY MOUTH ONCE DAILY  . latanoprost (XALATAN) 0.005 % ophthalmic solution Place 1 drop into both eyes at bedtime.   . Multiple Vitamin (MULTIVITAMIN) tablet Take 1 tablet by mouth daily.  . multivitamin-lutein (OCUVITE-LUTEIN) CAPS capsule Take 1 capsule by mouth every morning.   . polyethylene glycol powder (MIRALAX) powder Take 17 g by mouth at bedtime.   Marland Kitchen RESVERATROL-GRAPE PO Take 1 capsule by mouth daily. CoQ10 Resveratrol & Grape  Seed   No facility-administered medications prior to visit.    Review of Systems  Constitutional: Negative.   Eyes: Negative.   Respiratory: Negative.   Cardiovascular: Negative.   Gastrointestinal: Negative.   Endocrine: Negative.   Musculoskeletal: Positive for gait problem.  Allergic/Immunologic: Negative.   Neurological: Positive for dizziness.  Hematological: Negative.   Psychiatric/Behavioral: Negative.        Objective    BP (!) 154/76 (BP Location: Left Arm, Patient Position: Sitting, Cuff Size: Large)   Pulse 80   Temp (!) 97.3 F (36.3 C) (Other (Comment))   Resp 18   Ht 5\' 2"  (1.575 m)   Wt 184 lb (83.5 kg)   SpO2 97%   BMI 33.65 kg/m  BP Readings from Last 3 Encounters:  06/15/20 (!) 154/76  05/19/20 (!) 154/79  05/13/20 123/75   Wt Readings from Last 3 Encounters:  06/15/20 184 lb (83.5 kg)  05/13/20 185 lb 12.8 oz (84.3 kg)  11/05/19 183 lb (83 kg)      Physical Exam Vitals reviewed.  Constitutional:      Appearance: Normal appearance.  HENT:     Head: Normocephalic and atraumatic.     Right Ear: External ear normal.     Left Ear: External ear normal.  Eyes:     General: No scleral icterus.    Conjunctiva/sclera: Conjunctivae normal.  Cardiovascular:     Rate and Rhythm: Normal rate and regular rhythm.  Pulses: Normal pulses.     Heart sounds: Normal heart sounds.  Pulmonary:     Effort: Pulmonary effort is normal.     Breath sounds: Normal breath sounds.  Musculoskeletal:     Right lower leg: Edema present.     Left lower leg: Edema present.     Comments: Trace lower extremity edema  Skin:    General: Skin is warm and dry.  Neurological:     General: No focal deficit present.     Mental Status: She is alert and oriented to person, place, and time.     Cranial Nerves: No cranial nerve deficit.     Motor: No weakness.     Gait: Gait abnormal.     Comments: Gait improved from last visit.  Psychiatric:        Mood and Affect:  Mood normal.        Behavior: Behavior normal.        Thought Content: Thought content normal.        Judgment: Judgment normal.       No results found for any visits on 06/15/20.  Assessment & Plan     1. Unsteady gait when walking Brain MRI normal.  Physical therapy and to use a cane  2. Disorder of peripheral nervous system On gabapentin.  3. Benign essential HTN On amlodipine  4. Venous insufficiency of both lower extremities Wear support hose. 5.Vertigo Per ENT 6.h/o Breast Cancer No follow-ups on file.      I, Wilhemena Durie, MD, have reviewed all documentation for this visit. The documentation on 06/22/20 for the exam, diagnosis, procedures, and orders are all accurate and complete.    Calah Gershman Cranford Mon, MD  [facility] [phone] (phone) [phone] (fax)  [place]

## 2020-06-15 ENCOUNTER — Other Ambulatory Visit: Payer: Self-pay

## 2020-06-15 ENCOUNTER — Encounter: Payer: Self-pay | Admitting: Family Medicine

## 2020-06-15 ENCOUNTER — Ambulatory Visit (INDEPENDENT_AMBULATORY_CARE_PROVIDER_SITE_OTHER): Payer: PPO | Admitting: Family Medicine

## 2020-06-15 VITALS — BP 154/76 | HR 80 | Temp 97.3°F | Resp 18 | Ht 62.0 in | Wt 184.0 lb

## 2020-06-15 DIAGNOSIS — Z853 Personal history of malignant neoplasm of breast: Secondary | ICD-10-CM | POA: Diagnosis not present

## 2020-06-15 DIAGNOSIS — R42 Dizziness and giddiness: Secondary | ICD-10-CM | POA: Diagnosis not present

## 2020-06-15 DIAGNOSIS — I872 Venous insufficiency (chronic) (peripheral): Secondary | ICD-10-CM | POA: Diagnosis not present

## 2020-06-15 DIAGNOSIS — I10 Essential (primary) hypertension: Secondary | ICD-10-CM

## 2020-06-15 DIAGNOSIS — G64 Other disorders of peripheral nervous system: Secondary | ICD-10-CM

## 2020-06-15 DIAGNOSIS — R2681 Unsteadiness on feet: Secondary | ICD-10-CM

## 2020-06-16 ENCOUNTER — Ambulatory Visit: Payer: PPO | Admitting: Physical Therapy

## 2020-06-16 ENCOUNTER — Encounter: Payer: Self-pay | Admitting: Physical Therapy

## 2020-06-16 DIAGNOSIS — R2681 Unsteadiness on feet: Secondary | ICD-10-CM

## 2020-06-16 DIAGNOSIS — R42 Dizziness and giddiness: Secondary | ICD-10-CM | POA: Diagnosis not present

## 2020-06-17 NOTE — Therapy (Signed)
[facility] SERVICES [address] [place], [place], [address] Phone: [phone]   Fax:  [phone]  Physical Therapy Treatment  Patient Details  Name: Kelly Clark MRN: [id number] Date of Birth: 10/14/1929 Referring Provider (PT): Dr. McQueen   Encounter Date: 06/16/2020   PT End of Session - 06/17/20 1040    Visit Number 6    Number of Visits 9    Date for PT Re-Evaluation 07/10/20    Authorization Type eval: 05/15/20, FOTO COMPLETED    PT Start Time 1127    PT Stop Time 1200    PT Time Calculation (min) 33 min    Equipment Utilized During Treatment Gait belt    Activity Tolerance Patient tolerated treatment well    Behavior During Therapy WFL for tasks assessed/performed           Past Medical History:  Diagnosis Date  . Basal cell carcinoma 2019   RIGHT THUMB REGION  . Breast cancer (HCC) 06/27/2018   RIGHT  . Cancer (HCC) 1977   melanoma . SURGERY RIGHT ARM  . Hemorrhoids   . Hypertension   . Hypertriglyceridemia   . Insomnia   . Menopausal disorder   . Neutropenia (HCC)   . Paresthesia   . Peripheral neuropathy   . Tinea corporis   . Vitamin D deficiency     Past Surgical History:  Procedure Laterality Date  . ABDOMINAL HYSTERECTOMY  1977  . BREAST BIOPSY Bilateral 1977   benign  . BREAST BIOPSY Right 06/27/2018   INVASIVE MAMMARY CARCINOMA, NO SPECIAL TYPE. 2 oclock ER/PR negative HER2 positive  . BREAST EXCISIONAL BIOPSY Right 07/23/2018   lumpectomy  . BREAST LUMPECTOMY WITH SENTINEL LYMPH NODE BIOPSY Right 07/23/2018   6 mm ER/PR negative, Her 2 neu 3+; node negative.  Surgeon: Byrnett, Jeffrey W, MD;  Location: [facility] ORS;  Service: General;  Laterality: Right;  . COLONOSCOPY  2007  . ELECTROCARDIOGRAM    . EYE SURGERY Bilateral    CATARACT EXTRACTIONS    There were no vitals filed for this visit.   Subjective Assessment - 06/16/20 1130    Subjective Patient reports that she was able to walk  in from the parking lot today and it was excellent. Patient reports that she is doing better and is not having vertigo but still having good and bad days with balance. Patient reports at night when she lies down that she is having a little swimmy-head sensation 4/10 with turning to the left that lasts seconds.    Pertinent History Pt reports that her dizziness symptoms began 03/01/20. She got up from her bed and she states that she started spinning. Feels like she is being pulled to the right or falling backwards. She underwent VNG testing at [facility] on 04/08/20 with abnormal saccades and smooth pursuit. She had a 60% caloric weakness in the right ear. Positive for L horizontal canal BPPV during VNG per report but not treated. She had a follow-up with Dr. McQueen who referred her for VRT.    Limitations Walking    Diagnostic tests MRI ordered but not yet performed    Patient Stated Goals Decrease dizziness and improve her walking           Neuromuscular Re-education:  Dix-Hallpike: Performed left Dix-Hallpike test and it was negative with patient denying vertigo and no nystagmus observed. Patient did report fleeting sensation of dizziness, but no vertigo.  VOR X 1 exercise:  Reviewed VOR   X 1 exercise that was issued for HEP last session. Patient performed VOR X 1 horizontal in standing in corner 3 reps of 1 minute each with verbal cues for technique. Patient initially moving her head very quickly and required cues to slow down. Patent rates her dizziness as 1.5/10.  Airex pad:  Patient reports that she has been doing the feet together and semi-tandem progressions with head turns at home, but when asked to demonstrate, patient was not moving and turning her eyes with her head movement. Provided reeducation as to proper technique.  On firm surface and then on Airex pad, patient performed feet together progressions and semi-tandem progressions with alternating lead leg with and without body  turns and horizontal and vertical head turns. Patient requires cuing for technique and to perform slower head turns.  Patient denies increase in dizziness, but reports imbalance.   Ball tracking : Patient performed static standing while tossing ball horizontally while tracking ball with head and eyes with verbal cues to turn the head and follow/track ball with her eyes.  Ball toss over shoulder: Patient performed static standing in // bars while tossing ball over one shoulder with return catch over opposite shoulder with CGA with cue to track ball with her head and eyes.  Patient reports no dizziness with this activity.   Ambulation with head turns:  Patient performed 175' trials of forwards and retro ambulation with horizontal and vertical head turns with CGA to Min A secondary to several losses of balance requiring Min A with forward ambulation with horizontal head turns.    Patient reports imbalance with ambulation with head turns.   Toe Raises: Patient performed 15 reps bilateral toe raises with few finger upper extremity support with cuing for technique. Added to HEP.   Patient with negative left Dix-Hallpike test this date. Patient required reeducation for technique with feet together and semi-tandem with head turns home exercises. Added progression of standing VOR X1 and toe raises for HEP. Patient challenged by ambulation with head turns and required Min A at times secondary to small losses of balance. Patient's balance is challenged by uneven surfaces, narrow base of support and activities with head turning. Patient would benefit from continued PT services to try to reduce her falls risk and to address goals as set on POC.    PT Education - 06/17/20 1039    Education Details reeducated as to proper technique with feet together and semi-tandem progressions with head turns activities. progressed VOR X1 to standing. issued toe raises for HEP    Person(s) Educated Patient    Methods  Explanation;Demonstration;Verbal cues;Handout    Comprehension Verbalized understanding;Returned demonstration            PT Short Term Goals - 05/16/20 1710      PT SHORT TERM GOAL #1   Title Pt will be independent with HEP in order to improve strength and balance in order to decrease dizziness and fall risk and improve function at home.    Time 4    Period Weeks    Status New    Target Date 06/13/20             PT Long Term Goals - 05/16/20 1711      PT LONG TERM GOAL #1   Title Pt will improve ABC by at least 13% in order to demonstrate clinically significant improvement in balance confidence.    Baseline 05/15/20: 61.3%    Time 8    Period Weeks      Status New    Target Date 07/10/20      PT LONG TERM GOAL #2   Title Pt will decrease DHI score by at least 18 points in order to demonstrate clinically significant reduction in disability    Baseline 05/15/20: 24/100    Time 8    Period Weeks    Status New    Target Date 07/10/20      PT LONG TERM GOAL #3   Title Pt will improve FOTO to at least 83 in order to demonstrate significant improvement in function    Baseline 05/15/20: 76    Time 8    Period Weeks    Status New    Target Date 07/10/20      PT LONG TERM GOAL #4   Title Pt will report no further episodes of vertigo with laying down, getting up, or rolling over in bed    Time 8    Period Weeks    Status New    Target Date 07/10/20                 Plan - 06/17/20 1107    Clinical Impression Statement Patient with negative left Dix-Hallpike test this date. Patient required reeducation for technique with feet together and semi-tandem with head turns home exercises. Added progression of standing VOR X1 and toe raises for HEP. Patient challenged by ambulation with head turns and required Min A at times secondary to small losses of balance. Patient's balance is challenged by uneven surfaces, narrow base of support and activities with head turning. Patient  would benefit from continued PT services to try to reduce her falls risk and to address goals as set on POC.    Personal Factors and Comorbidities Age;Comorbidity 2    Comorbidities HTN, breast CA, sleep disturbance    Examination-Activity Limitations Bed Mobility;Bend;Carry;Reach Overhead;Transfers    Examination-Participation Restrictions Community Activity;Driving;Volunteer    Stability/Clinical Decision Making Unstable/Unpredictable    Rehab Potential Good    PT Frequency 1x / week    PT Duration 8 weeks    PT Treatment/Interventions ADLs/Self Care Home Management;Aquatic Therapy;Biofeedback;Canalith Repostioning;Cryotherapy;Electrical Stimulation;Iontophoresis 34m/ml Dexamethasone;Moist Heat;Traction;Ultrasound;DME Instruction;Gait training;Stair training;Functional mobility training;Therapeutic activities;Therapeutic exercise;Balance training;Neuromuscular re-education;Cognitive remediation;Patient/family education;Manual techniques;Passive range of motion;Dry needling;Vestibular;Joint Manipulations    PT Next Visit Plan Recheck roll test and treat with Gufoni maneuver for presumed L horizontal cupulolithiasis, once clear complete DGI/FGA/BERG and initiate VRT as well as balance therapy    PT Home Exercise Plan None currently    Consulted and Agree with Plan of Care Patient           Patient will benefit from skilled therapeutic intervention in order to improve the following deficits and impairments:  Decreased balance, Dizziness  Visit Diagnosis: Dizziness and giddiness  Unsteadiness on feet     Problem List Patient Active Problem List   Diagnosis Date Noted  . Lichen sclerosus 184/69/6295 . Actinic keratosis 09/21/2018  . Malignant neoplasm of upper-inner quadrant of right breast in female, estrogen receptor negative ([place] 07/11/2018  . Benign essential HTN 09/08/2017  . Adult BMI 30+ 07/06/2015  . Hemorrhoid 07/06/2015  . [place]ropenia ([facility] 07/06/2015  . Disorder of  peripheral nervous system 07/06/2015  . Avitaminosis D 07/06/2015  . Vaginal atrophy 07/06/2015  . Abnormal LFTs 08/21/2009  . Cannot sleep 05/23/2000  . Hypertriglyceridemia 05/19/2000   DLady DeutscherPT, DPT #[id number] Deutscher6/30/2021, 11:08 AM  [email or web address]  [place], [place], [address] Phone: [phone]   Fax:  [phone]  Name: Kelly Clark MRN: [id number] Date of Birth: 10/27/1929   

## 2020-06-23 ENCOUNTER — Other Ambulatory Visit: Payer: Self-pay

## 2020-06-23 ENCOUNTER — Ambulatory Visit: Payer: PPO | Attending: Unknown Physician Specialty

## 2020-06-23 DIAGNOSIS — R2681 Unsteadiness on feet: Secondary | ICD-10-CM | POA: Diagnosis not present

## 2020-06-23 DIAGNOSIS — R42 Dizziness and giddiness: Secondary | ICD-10-CM | POA: Insufficient documentation

## 2020-06-23 NOTE — Therapy (Signed)
[place] MAIN [facility] SERVICES [address] [place], [place], [address] Phone: [phone]   Fax:  [phone]  Physical Therapy Treatment  Patient Details  Name: Kelly Clark MRN: [id number] Date of Birth: 06-Jun-1929 Referring Provider (PT): Dr. Tami Ribas   Encounter Date: 06/23/2020   PT End of Session - 06/23/20 1009    Visit Number 7    Number of Visits 9    Date for PT Re-Evaluation 07/10/20    Authorization Type eval: 05/15/20, FOTO COMPLETED    PT Start Time 1015    PT Stop Time 1100    PT Time Calculation (min) 45 min    Equipment Utilized During Treatment Gait belt    Activity Tolerance Patient tolerated treatment well    Behavior During Therapy [facility] for tasks assessed/performed           Past Medical History:  Diagnosis Date  . Basal cell carcinoma 2019   RIGHT THUMB REGION  . Breast cancer ([place]) 06/27/2018   RIGHT  . Cancer ([place]) 1977   melanoma . SURGERY RIGHT ARM  . Hemorrhoids   . Hypertension   . Hypertriglyceridemia   . Insomnia   . Menopausal disorder   . Neutropenia ([place])   . Paresthesia   . Peripheral neuropathy   . Tinea corporis   . Vitamin D deficiency     Past Surgical History:  Procedure Laterality Date  . ABDOMINAL HYSTERECTOMY  1977  . BREAST BIOPSY Bilateral 1977   benign  . BREAST BIOPSY Right 06/27/2018   INVASIVE MAMMARY CARCINOMA, NO SPECIAL TYPE. 2 oclock ER/PR negative HER2 positive  . BREAST EXCISIONAL BIOPSY Right 07/23/2018   lumpectomy  . BREAST LUMPECTOMY WITH SENTINEL LYMPH NODE BIOPSY Right 07/23/2018   6 mm ER/PR negative, Her 2 neu 3+; node negative.  Surgeon: Robert Bellow, MD;  Location: [facility] ORS;  Service: General;  Laterality: Right;  . COLONOSCOPY  2007  . ELECTROCARDIOGRAM    . EYE SURGERY Bilateral    CATARACT EXTRACTIONS    There were no vitals filed for this visit.   Subjective Assessment - 06/23/20 1009    Subjective Patient was able to walk in to the clinic  today from the parking lot. She reports continued "dizziness" when laying down on the left side for approximately 1 minute. She rates the intensity as 4/10 and is unable to clarify whether she is getting any vertigo or not. Otherwise she reports that she is doing "a lot better."    Pertinent History Pt reports that her dizziness symptoms began 03/01/20. She got up from her bed and she states that she started spinning. Feels like she is being pulled to the right or falling backwards. She underwent VNG testing at [facility] on 04/08/20 with abnormal saccades and smooth pursuit. She had a 60% caloric weakness in the right ear. Positive for L horizontal canal BPPV during VNG per report but not treated. She had a follow-up with Dr. Tami Ribas who referred her for VRT.    Limitations Walking    Diagnostic tests MRI ordered but not yet performed    Patient Stated Goals Decrease dizziness and improve her walking    Currently in Pain? No/denies              TREATMENT   Neuromuscular Re-education:  BPPV Testing Performed BPPV testing with patient with and without IR goggles in all positions. Results are somewhat difficult to interpret due to resting L horizontal beating nystagmus;  R Dix-Hallpike: Pure downbeating, mild dizziness L Dix Hallpike: geotrophic beating approximately 45s, no symptoms; R Roll Test: geotrophic pure horizontal, 2/10 dizziness L Roll Test: geotrophic pure horiziontal, appears slightly more vigorous than resting nystagmus, no dizziness;  VOR X 1 exercise:  Patient performed VOR X 1 horizontal in standing  3 reps of 1 minute each with verbal cues for technique. First bout performed with feet together, pt denies dizziness. Second and third bout performed in semitandem stance, pt denies dizziness but reports some unsteadiness;   Airex pad:  Feet together balance with horizontal head turns x 60s, and then horizontal head/body turns x 60s; Feet together balance with vertical head  turns x 60s; Alternating 6" step taps without UE support x 10 each; Staggerred stance balance with front foot on 6" step and rear-foot on Airex pad alternating forward LE x 60s each; Airex balloon passes with therapist, therapist adjusts distance and direction of balloon to challenge head turns as well as dynamic reaching;   Pt educated throughout session about proper posture and technique with exercises. Improved exercise technique, movement at target joints, use of target muscles after min to mod verbal, visual, tactile cues.    Patient with pure downbeating nystagmus on the R side today with mild dizziness. L Hallpike she presents with geotrophic beats of approximately 45s but no symptoms. Difficult to assess given her resting L horizontal beating nystagmus. R and L roll test are positive for geotrophic nystagmus bilaterally but pt only complains of dizziness on the L side. Findings of canal testing are unclear given that for the most part she has remained asymptomatic. She could have a R horizontal canalithiasis but deferred intervention due to the fact that she is mostly reporting resolution of her vertigo. Continued with gaze stabilization exercises today as well as habituation head turning. Pt will benefit from PT services to address deficits in strength, balance, and mobility in order to return to full function at home.                           PT Short Term Goals - 05/16/20 1710      PT SHORT TERM GOAL #1   Title Pt will be independent with HEP in order to improve strength and balance in order to decrease dizziness and fall risk and improve function at home.    Time 4    Period Weeks    Status New    Target Date 06/13/20             PT Long Term Goals - 05/16/20 1711      PT LONG TERM GOAL #1   Title Pt will improve ABC by at least 13% in order to demonstrate clinically significant improvement in balance confidence.    Baseline 05/15/20: 61.3%    Time 8     Period Weeks    Status New    Target Date 07/10/20      PT LONG TERM GOAL #2   Title Pt will decrease DHI score by at least 18 points in order to demonstrate clinically significant reduction in disability    Baseline 05/15/20: 24/100    Time 8    Period Weeks    Status New    Target Date 07/10/20      PT LONG TERM GOAL #3   Title Pt will improve FOTO to at least 83 in order to demonstrate significant improvement in function    Baseline 05/15/20:  76    Time 8    Period Weeks    Status New    Target Date 07/10/20      PT LONG TERM GOAL #4   Title Pt will report no further episodes of vertigo with laying down, getting up, or rolling over in bed    Time 8    Period Weeks    Status New    Target Date 07/10/20                 Plan - 06/23/20 1133    Clinical Impression Statement Patient with pure downbeating nystagmus on the R side today with mild dizziness. L Hallpike she presents with geotrophic beats of approximately 45s but no symptoms. Difficult to assess given her resting L horizontal beating nystagmus. R and L roll test are positive for geotrophic nystagmus bilaterally but pt only complains of dizziness on the L side. Findings of canal testing are unclear given that for the most part she has remained asymptomatic. She could have a R horizontal canalithiasis but deferred intervention due to the fact that she is mostly reporting resolution of her vertigo. Continued with gaze stabilization exercises today as well as habituation head turning. Pt will benefit from PT services to address deficits in strength, balance, and mobility in order to return to full function at home.    Personal Factors and Comorbidities Age;Comorbidity 2    Comorbidities HTN, breast CA, sleep disturbance    Examination-Activity Limitations Bed Mobility;Bend;Carry;Reach Overhead;Transfers    Examination-Participation Restrictions Community Activity;Driving;Volunteer    Stability/Clinical Decision Making  Unstable/Unpredictable    Rehab Potential Good    PT Frequency 1x / week    PT Duration 8 weeks    PT Treatment/Interventions ADLs/Self Care Home Management;Aquatic Therapy;Biofeedback;Canalith Repostioning;Cryotherapy;Electrical Stimulation;Iontophoresis '4mg'$ /ml Dexamethasone;Moist Heat;Traction;Ultrasound;DME Instruction;Gait training;Stair training;Functional mobility training;Therapeutic activities;Therapeutic exercise;Balance training;Neuromuscular re-education;Cognitive remediation;Patient/family education;Manual techniques;Passive range of motion;Dry needling;Vestibular;Joint Manipulations    PT Next Visit Plan Recheck roll test and treat with Gufoni maneuver for presumed L horizontal cupulolithiasis, once clear complete DGI/FGA/BERG and initiate VRT as well as balance therapy    PT Home Exercise Plan None currently    Consulted and Agree with Plan of Care Patient           Patient will benefit from skilled therapeutic intervention in order to improve the following deficits and impairments:  Decreased balance, Dizziness  Visit Diagnosis: Dizziness and giddiness  Unsteadiness on feet     Problem List Patient Active Problem List   Diagnosis Date Noted  . Lichen sclerosus 82/95/6213  . Actinic keratosis 09/21/2018  . Malignant neoplasm of upper-inner quadrant of right breast in female, estrogen receptor negative ([place]) 07/11/2018  . Benign essential HTN 09/08/2017  . Adult BMI 30+ 07/06/2015  . Hemorrhoid 07/06/2015  . Disorder of peripheral nervous system 07/06/2015  . Avitaminosis D 07/06/2015  . Vaginal atrophy 07/06/2015  . Abnormal LFTs 08/21/2009  . Cannot sleep 05/23/2000  . Hypertriglyceridemia 05/19/2000   Phillips Grout PT, DPT, GCS  Trigo Winterbottom 06/23/2020, 11:44 AM  [place] MAIN [facility] SERVICES [address] [place], [place], [address] Phone: [phone]   Fax:  [phone]  Name: Kelly Clark MRN: [id number] Date  of Birth: 11-Jan-1929

## 2020-06-30 ENCOUNTER — Ambulatory Visit: Payer: PPO

## 2020-06-30 ENCOUNTER — Other Ambulatory Visit: Payer: Self-pay

## 2020-06-30 DIAGNOSIS — R42 Dizziness and giddiness: Secondary | ICD-10-CM

## 2020-06-30 DIAGNOSIS — R2681 Unsteadiness on feet: Secondary | ICD-10-CM

## 2020-06-30 NOTE — Therapy (Signed)
[facility] MAIN [facility] SERVICES [address] [place], [place], [address] Phone: [phone]   Fax:  [phone]  Physical Therapy Treatment  Patient Details  Name: Kelly Clark MRN: [id number] Date of Birth: 02/07/1929 Referring Provider (PT): Dr. Jenne Campus   Encounter Date: 06/30/2020   PT End of Session - 06/30/20 1001    Visit Number 8    Number of Visits 9    Date for PT Re-Evaluation 07/10/20    Authorization Type eval: 05/15/20, FOTO COMPLETED    PT Start Time 1015    PT Stop Time 1100    PT Time Calculation (min) 45 min    Equipment Utilized During Treatment Gait belt    Activity Tolerance Patient tolerated treatment well    Behavior During Therapy [facility] for tasks assessed/performed           Past Medical History:  Diagnosis Date  . Basal cell carcinoma 2019   RIGHT THUMB REGION  . Breast cancer (HCC) 06/27/2018   RIGHT  . Cancer (HCC) 1977   melanoma . SURGERY RIGHT ARM  . Hemorrhoids   . Hypertension   . Hypertriglyceridemia   . Insomnia   . Menopausal disorder   . Neutropenia (HCC)   . Paresthesia   . Peripheral neuropathy   . Tinea corporis   . Vitamin D deficiency     Past Surgical History:  Procedure Laterality Date  . ABDOMINAL HYSTERECTOMY  1977  . BREAST BIOPSY Bilateral 1977   benign  . BREAST BIOPSY Right 06/27/2018   INVASIVE MAMMARY CARCINOMA, NO SPECIAL TYPE. 2 oclock ER/PR negative HER2 positive  . BREAST EXCISIONAL BIOPSY Right 07/23/2018   lumpectomy  . BREAST LUMPECTOMY WITH SENTINEL LYMPH NODE BIOPSY Right 07/23/2018   6 mm ER/PR negative, Her 2 neu 3+; node negative.  Surgeon: Earline Mayotte, MD;  Location: [facility] ORS;  Service: General;  Laterality: Right;  . COLONOSCOPY  2007  . ELECTROCARDIOGRAM    . EYE SURGERY Bilateral    CATARACT EXTRACTIONS    There were no vitals filed for this visit.   Subjective Assessment - 06/30/20 1000    Subjective Pt reports that she is doing well today.  She has not had any further bouts of vertigo since the last therapy session. No stumbles or falls reported. No pain reported upon arrival today.    Pertinent History Pt reports that her dizziness symptoms began 03/01/20. She got up from her bed and she states that she started spinning. Feels like she is being pulled to the right or falling backwards. She underwent VNG testing at [facility] on 04/08/20 with abnormal saccades and smooth pursuit. She had a 60% caloric weakness in the right ear. Positive for L horizontal canal BPPV during VNG per report but not treated. She had a follow-up with Dr. Jenne Campus who referred her for VRT.    Limitations Walking    Diagnostic tests MRI ordered but not yet performed    Patient Stated Goals Decrease dizziness and improve her walking    Currently in Pain? No/denies             TREATMENT    Neuromuscular Re-education: NuStep L2 x 5 minutes for warm-up during history (4 minutes unbilled);  VOR X 1 exercise:  VOR X 1 horizontal in standing NBOS x 1 minute with plain background, denies dizziness;  VOR X 1 vertical in standing NBOS x 1 minute with plain background, denies dizziness; VOR X 1 horizontal in  standing NBOS x 1 minute with pattenred background, denies dizziness;   Heel/Toe Raises: Patient performed 10 reps bilateral with no UE support, cues to hold heel raise for slightly longer as she tends to come down relatively quickly;   Airex pad:  Feet together balance with horizontal head turns x 60s, and then horizontal head/body turns x 60s; Feet together balance with vertical head turns x 60s; Alternating 6" step taps without UE support x 10 each; Sit to stand without UE support Airex under feet with alternating left and right head turns during transfer x 10; Patient performed static standing in // bars while passing ball over one shoulder with return catch over opposite shoulder with CGA with cue to track ball with her head and eyes, therapist  adjusting height between waist to overhead;  Patient performed static standing while tossing ball horizontally while tracking ball with head and eyes with verbal cues to turn the head and follow/track ball with her eyes.  Ambulation with head turns:  Patient performed 62' trials of forwards ambulation with horizontal and vertical head turns with CGA to Min A secondary to several episodes of staggering requiring Min A to correct;   Forward gait with horizontal ball toss/pass to therapist with head/eye follow x 75' to each side; Forward gait with horizontal ball bounce to therapist with head/eye follow x 75' to each side; Reviewed and progressed HEP with patient and provided handout;   Pt educated throughout session about proper posture and technique with exercises. Improved exercise technique, movement at target joints, use of target muscles after min to mod verbal, visual, tactile cues.    Patient demonstrates good motivation during session today. Continued with gaze stabilization exercises today as well as habituation head turning. She denies any dizziness with gaze stabilization exercises today. Denies any episodes of vertigo since last therapy session so BPPV testing/treatment deferred. Reviewed HEP with patient and provided handout. She will need updated outcome measures/goals at next therapy session as recertification vs discharge pending progress and plan of primary therapist. Pt will benefit from PT services to address deficits in strength, balance, and mobility in order to return to full function at home.                            PT Short Term Goals - 05/16/20 1710      PT SHORT TERM GOAL #1   Title Pt will be independent with HEP in order to improve strength and balance in order to decrease dizziness and fall risk and improve function at home.    Time 4    Period Weeks    Status New    Target Date 06/13/20             PT Long Term Goals - 05/16/20 1711       PT LONG TERM GOAL #1   Title Pt will improve ABC by at least 13% in order to demonstrate clinically significant improvement in balance confidence.    Baseline 05/15/20: 61.3%    Time 8    Period Weeks    Status New    Target Date 07/10/20      PT LONG TERM GOAL #2   Title Pt will decrease DHI score by at least 18 points in order to demonstrate clinically significant reduction in disability    Baseline 05/15/20: 24/100    Time 8    Period Weeks    Status New  Target Date 07/10/20      PT LONG TERM GOAL #3   Title Pt will improve FOTO to at least 83 in order to demonstrate significant improvement in function    Baseline 05/15/20: 76    Time 8    Period Weeks    Status New    Target Date 07/10/20      PT LONG TERM GOAL #4   Title Pt will report no further episodes of vertigo with laying down, getting up, or rolling over in bed    Time 8    Period Weeks    Status New    Target Date 07/10/20                 Plan - 06/30/20 1003    Clinical Impression Statement Patient demonstrates good motivation during session today. Continued with gaze stabilization exercises today as well as habituation head turning. She denies any dizziness with gaze stabilization exercises today. Denies any episodes of vertigo since last therapy session so BPPV testing/treatment deferred. Reviewed HEP with patient and provided handout. She will need updated outcome measures/goals at next therapy session as recertification vs discharge pending progress and plan of primary therapist. Pt will benefit from PT services to address deficits in strength, balance, and mobility in order to return to full function at home.    Personal Factors and Comorbidities Age;Comorbidity 2    Comorbidities HTN, breast CA, sleep disturbance    Examination-Activity Limitations Bed Mobility;Bend;Carry;Reach Overhead;Transfers    Examination-Participation Restrictions Community Activity;Driving;Volunteer    Stability/Clinical  Decision Making Unstable/Unpredictable    Rehab Potential Good    PT Frequency 1x / week    PT Duration 8 weeks    PT Treatment/Interventions ADLs/Self Care Home Management;Aquatic Therapy;Biofeedback;Canalith Repostioning;Cryotherapy;Electrical Stimulation;Iontophoresis '4mg'$ /ml Dexamethasone;Moist Heat;Traction;Ultrasound;DME Instruction;Gait training;Stair training;Functional mobility training;Therapeutic activities;Therapeutic exercise;Balance training;Neuromuscular re-education;Cognitive remediation;Patient/family education;Manual techniques;Passive range of motion;Dry needling;Vestibular;Joint Manipulations    PT Next Visit Plan Update outcome measures and goals, recert vs discharge pending progress and plan of primary therapist. As needed recheck roll test and treat with Gufoni maneuver for presumed L horizontal cupulolithiasis.    PT Home Exercise Plan Access Code: [email or web address]    Consulted and Agree with Plan of Care Patient           Patient will benefit from skilled therapeutic intervention in order to improve the following deficits and impairments:  Decreased balance, Dizziness  Visit Diagnosis: Dizziness and giddiness  Unsteadiness on feet     Problem List Patient Active Problem List   Diagnosis Date Noted  . Lichen sclerosus 75/64/3329  . Actinic keratosis 09/21/2018  . Malignant neoplasm of upper-inner quadrant of right breast in female, estrogen receptor negative ([place]) 07/11/2018  . Benign essential HTN 09/08/2017  . Adult BMI 30+ 07/06/2015  . Hemorrhoid 07/06/2015  . Disorder of peripheral nervous system 07/06/2015  . Avitaminosis D 07/06/2015  . Vaginal atrophy 07/06/2015  . Abnormal LFTs 08/21/2009  . Cannot sleep 05/23/2000  . Hypertriglyceridemia 05/19/2000   Phillips Grout PT, DPT, GCS  Shari Natt 06/30/2020, 2:06 PM  [place] MAIN [facility] SERVICES [address] [place], [place], [address] Phone: [phone]   Fax:   [phone]  Name: Kelly Clark MRN: [id number] Date of Birth: 07/03/1929

## 2020-06-30 NOTE — Patient Instructions (Addendum)
Access Code: [email or web address]  URL: [email or web address] Date: 06/30/2020 Prepared by: Roxana Hires  Exercises Standing Gaze Stabilization with Head Rotation - 4 x daily - 7 x weekly - 3 reps - 60s hold Standing Romberg to 1/2 Tandem Stance - 1 x daily - 7 x weekly - 3 x 30s with each foot forward hold Half Tandem Stance Balance with Head Rotation - 1 x daily - 7 x weekly - 3 x 30s with each foot forward hold Heel Toe Raises with Counter Support - 1 x daily - 7 x weekly - 2 sets - 10 reps - 3s hold Mini Squat with Counter Support - 1 x daily - 7 x weekly - 2 sets - 10 reps Sit to Stand without Arm Support - 1 x daily - 7 x weekly - 2 sets - 10 reps

## 2020-07-07 ENCOUNTER — Other Ambulatory Visit: Payer: Self-pay

## 2020-07-07 ENCOUNTER — Encounter: Payer: PPO | Admitting: Physical Therapy

## 2020-07-07 ENCOUNTER — Ambulatory Visit: Payer: PPO | Admitting: Physical Therapy

## 2020-07-07 ENCOUNTER — Encounter: Payer: Self-pay | Admitting: Physical Therapy

## 2020-07-07 DIAGNOSIS — R42 Dizziness and giddiness: Secondary | ICD-10-CM

## 2020-07-07 DIAGNOSIS — R2681 Unsteadiness on feet: Secondary | ICD-10-CM

## 2020-07-07 NOTE — Therapy (Signed)
[place] MAIN [facility] SERVICES [address] [place], [place], [address] Phone: [phone]   Fax:  [phone]  Physical Therapy Treatment/Recertification  Dates of Services: 9/62/8366-2/94/7654 with new recert end date of 6/50/3546  Patient Details  Name: Kelly Clark MRN: [id number] Date of Birth: 10-06-1929 Referring Provider (PT): Dr. Tami Ribas   Encounter Date: 07/07/2020   PT End of Session - 07/07/20 0914    Visit Number 9    Number of Visits 14    Date for PT Re-Evaluation 08/05/2020   Authorization Type eval: 05/15/20, FOTO COMPLETED    PT Start Time 0914    PT Stop Time 1002    PT Time Calculation (min) 48 min    Equipment Utilized During Treatment Gait belt    Activity Tolerance Patient tolerated treatment well    Behavior During Therapy [facility] for tasks assessed/performed           Past Medical History:  Diagnosis Date  . Basal cell carcinoma 2019   RIGHT THUMB REGION  . Breast cancer ([place]) 06/27/2018   RIGHT  . Cancer ([place]) 1977   melanoma . SURGERY RIGHT ARM  . Hemorrhoids   . Hypertension   . Hypertriglyceridemia   . Insomnia   . Menopausal disorder   . Neutropenia ([place])   . Paresthesia   . Peripheral neuropathy   . Tinea corporis   . Vitamin D deficiency     Past Surgical History:  Procedure Laterality Date  . ABDOMINAL HYSTERECTOMY  1977  . BREAST BIOPSY Bilateral 1977   benign  . BREAST BIOPSY Right 06/27/2018   INVASIVE MAMMARY CARCINOMA, NO SPECIAL TYPE. 2 oclock ER/PR negative HER2 positive  . BREAST EXCISIONAL BIOPSY Right 07/23/2018   lumpectomy  . BREAST LUMPECTOMY WITH SENTINEL LYMPH NODE BIOPSY Right 07/23/2018   6 mm ER/PR negative, Her 2 neu 3+; node negative.  Surgeon: Robert Bellow, MD;  Location: [facility] ORS;  Service: General;  Laterality: Right;  . COLONOSCOPY  2007  . ELECTROCARDIOGRAM    . EYE SURGERY Bilateral    CATARACT EXTRACTIONS    There were no vitals filed for this  visit.   Subjective Assessment - 07/07/20 0927    Subjective Patient reports she has been doing her home exercise program. Patient states that the bottom of her feet are sore and states she sometime does her exercises in soft bottom shoes and other times uses sneakers. Patient states her feet and toes are swollen.    Pertinent History Pt reports that her dizziness symptoms began 03/01/20. She got up from her bed and she states that she started spinning. Feels like she is being pulled to the right or falling backwards. She underwent VNG testing at [facility] on 04/08/20 with abnormal saccades and smooth pursuit. She had a 60% caloric weakness in the right ear. Positive for L horizontal canal BPPV during VNG per report but not treated. She had a follow-up with Dr. Tami Ribas who referred her for VRT.    Limitations Walking    Diagnostic tests MRI ordered but not yet performed    Patient Stated Goals Decrease dizziness and improve her walking           Neuromuscular Re-education:  Inspected patients feet. Patient with mild swelling in bilateral ankles. No swelling or bruising noted plantar surface of feet and skin was intact.  Patient denies any changes to her medications. Discussed wearing tennis shoes and to hold on the toe and heel raises  exercises as patient reports that she felt this might have increased her feet discomfort.    [facility] PT Assessment - 07/07/20 0947      Standardized Balance Assessment   Standardized Balance Assessment Berg Balance Test      Berg Balance Test   Sit to Stand Able to stand without using hands and stabilize independently    Standing Unsupported Able to stand safely 2 minutes    Sitting with Back Unsupported but Feet Supported on Floor or Stool Able to sit safely and securely 2 minutes    Stand to Sit Sits safely with minimal use of hands    Transfers Able to transfer safely, minor use of hands    Standing Unsupported with Eyes Closed Able to stand 10 seconds  with supervision    Standing Unsupported with Feet Together Able to place feet together independently and stand for 1 minute with supervision    From Standing, Reach Forward with Outstretched Arm Can reach confidently >25 cm (10")    From Standing Position, Pick up Object from Floor Able to pick up shoe safely and easily    From Standing Position, Turn to Look Behind Over each Shoulder Turn sideways only but maintains balance    Turn 360 Degrees Able to turn 360 degrees safely in 4 seconds or less    Standing Unsupported, Alternately Place Feet on Step/Stool Able to stand independently and safely and complete 8 steps in 20 seconds    Standing Unsupported, One Foot in Front Able to plae foot ahead of the other independently and hold 30 seconds    Standing on One Leg Able to lift leg independently and hold equal to or more than 3 seconds    Total Score 49           FUNCTIONAL OUTCOME MEASURES:  Results Comments  DHI 2/100 low perception of handicap  ABC Scale 81.3% normal  FOTO 97/100 Like patients would have a projected score of  83/100  Berg 49/56 In need of intervention   Mini-squats: Patient performed 15 reps mini-squats with 5 second holds with verbal cues to maintain upright posture and to not lean forward.  Slow Marching: Patient performed on firm surface, slow marching with 3 second holds with CGA 2 sets of 10 reps each leg.   Semi-Tandem Progressions exercise:  On firm surface, patient performed semi-tandem progressions with alternating lead leg with and without  horizontal and vertical head turns.  Performed multiple 1 minute reps of each. Patient challenged by this narrow base of support activity and needed cuing for foot placement to provide adequate and appropriate balance challenge.      PT Education - 07/07/20 0914    Education Details Discussed functional outcome testing and compared to prior testing; discussed progress towards goals and plan of care; Added slow   Marching to HEP   Person(s) Educated Patient    Methods Explanation; demonstration; hand out   Comprehension Verbalized understanding; returned demonstration           PT Short Term Goals - 07/09/20 0855      PT SHORT TERM GOAL #1   Title Pt will be independent with HEP in order to improve strength and balance in order to decrease dizziness and fall risk and improve function at home.    Time 4    Period Weeks    Status On-going    Target Date 06/13/20             PT Long  Term Goals - 07/09/20 3568      PT LONG TERM GOAL #1   Title Pt will improve ABC by at least 13% in order to demonstrate clinically significant improvement in balance confidence.    Baseline 05/15/20: 61.3%; scored 81.3% on 07/07/2020    Time 8    Period Weeks    Status Achieved      PT LONG TERM GOAL #2   Title Pt will decrease DHI score by at least 18 points in order to demonstrate clinically significant reduction in disability    Baseline 05/15/20: 24/100; scored 2/100 on 07/07/2020    Time 8    Period Weeks    Status Achieved      PT LONG TERM GOAL #3   Title Pt will improve FOTO to at least 83 in order to demonstrate significant improvement in function    Baseline 05/15/20: 76; scored 97/100 on 07/07/2020    Time 8    Period Weeks    Status Achieved      PT LONG TERM GOAL #4   Title Pt will report no further episodes of vertigo with laying down, getting up, or rolling over in bed    Baseline patient denies vertigo with all positions on 07/07/2020    Time 8    Period Weeks    Status Achieved      PT LONG TERM GOAL #5   Title Patient will have improved Berg balance score of 53/56 or better to demonstrate improvement in patient's balance to help decrease patient's falls risk.    Baseline scored 49/56 on 07/07/2020    Time 4    Period Weeks    Status New    Target Date 08/05/20                 Plan - 07/10/20 6168    Clinical Impression Statement Repeated functional outcome measure  testing this date and patient improved from 61 to 81.3% on the ABC scae and improved from 24/100 to 2/100 on the Dizziness handicap inventory. Patient impromved from 76/100 to 97/100 on the FOTO score indicating significant improvement in patient's mobility and ADL skills. Patient met 4/4 long term goals and STG is still on going. Patient reports that she would like to continue to work on her balance skills now that her vestibular issues have subsided. Patient scored 49/56 on the Berg balance scale and added a Berg goal to long term goals. Patient would benefit from a few additional sessions to further address balance deficits and goals as set on plan of care.    Personal Factors and Comorbidities Age;Comorbidity 2    Comorbidities HTN, breast CA, sleep disturbance    Examination-Activity Limitations Bed Mobility;Bend;Carry;Reach Overhead;Transfers    Examination-Participation Restrictions Community Activity;Driving;Volunteer    Stability/Clinical Decision Making Unstable/Unpredictable    Rehab Potential Good    PT Frequency 1x / week    PT Duration 8 weeks    PT Treatment/Interventions ADLs/Self Care Home Management;Aquatic Therapy;Biofeedback;Canalith Repostioning;Cryotherapy;Electrical Stimulation;Iontophoresis 59m/ml Dexamethasone;Moist Heat;Traction;Ultrasound;DME Instruction;Gait training;Stair training;Functional mobility training;Therapeutic activities;Therapeutic exercise;Balance training;Neuromuscular re-education;Cognitive remediation;Patient/family education;Manual techniques;Passive range of motion;Dry needling;Vestibular;Joint Manipulations    PT Next Visit Plan Update outcome measures and goals, recert vs discharge pending progress and plan of primary therapist. As needed recheck roll test and treat with Gufoni maneuver for presumed L horizontal cupulolithiasis.    PT Home Exercise Plan Access Code: [email or web address]; [email or web address] added slow marching to HEP    Consulted and Agree with Plan of Care  Patient  Patient will benefit from skilled therapeutic intervention in order to improve the following deficits and impairments:  Decreased balance, Dizziness  Visit Diagnosis: Dizziness and giddiness  Unsteadiness on feet     Problem List Patient Active Problem List   Diagnosis Date Noted  . Lichen sclerosus 03/50/0938  . Actinic keratosis 09/21/2018  . Malignant neoplasm of upper-inner quadrant of right breast in female, estrogen receptor negative ([place]) 07/11/2018  . Benign essential HTN 09/08/2017  . Adult BMI 30+ 07/06/2015  . Hemorrhoid 07/06/2015  . Disorder of peripheral nervous system 07/06/2015  . Avitaminosis D 07/06/2015  . Vaginal atrophy 07/06/2015  . Abnormal LFTs 08/21/2009  . Cannot sleep 05/23/2000  . Hypertriglyceridemia 05/19/2000   Lady Deutscher PT, DPT [phone] Lady Deutscher 07/07/2020, 10:08 AM  [place] MAIN [facility] [address] [place], [place], [address] Phone: [phone]   Fax:  [phone]  Name: Kelly Clark MRN: [id number] Date of Birth: 1929/10/19

## 2020-07-07 NOTE — Patient Instructions (Signed)
Access Code: [email or web address] URL: [email or web address] Date: 07/07/2020 Prepared by: Lady Deutscher  Exercises Standing Gaze Stabilization with Head Rotation - 4 x daily - 7 x weekly - 3 reps - 60s hold Standing Romberg to 1/2 Tandem Stance - 1 x daily - 7 x weekly - 3 x 30s with each foot forward hold Half Tandem Stance Balance with Head Rotation - 1 x daily - 7 x weekly - 3 x 30s with each foot forward hold Heel Toe Raises with Counter Support - 1 x daily - 7 x weekly - 2 sets - 10 reps - 3s hold Mini Squat with Counter Support - 1 x daily - 7 x weekly - 2 sets - 10 reps Sit to Stand without Arm Support - 1 x daily - 7 x weekly - 2 sets - 10 reps Standing Single Leg Stance with Unilateral Counter Support - 1 x daily - 7 x weekly - 2 sets - 10 reps - 3-5 hold

## 2020-07-14 ENCOUNTER — Ambulatory Visit: Payer: PPO | Admitting: Physical Therapy

## 2020-07-14 ENCOUNTER — Other Ambulatory Visit: Payer: Self-pay

## 2020-07-14 ENCOUNTER — Encounter: Payer: Self-pay | Admitting: Physical Therapy

## 2020-07-14 VITALS — BP 141/65 | HR 86

## 2020-07-14 DIAGNOSIS — R42 Dizziness and giddiness: Secondary | ICD-10-CM

## 2020-07-14 DIAGNOSIS — R2681 Unsteadiness on feet: Secondary | ICD-10-CM

## 2020-07-14 NOTE — Therapy (Signed)
[place] MAIN [facility] SERVICES [address] [place], [place], [address] Phone: [phone]   Fax:  [phone]  Physical Therapy Treatment  Patient Details  Name: Kelly Clark MRN: [id number] Date of Birth: [birth year] Referring Provider (PT): Dr. Tami Ribas   Encounter Date: 07/14/2020   PT End of Session - 07/14/20 1119    Visit Number 10    Number of Visits 14    Date for PT Re-Evaluation 08/05/20    Authorization Type eval: 05/15/20, FOTO COMPLETED    PT Start Time 1118    PT Stop Time 1200    PT Time Calculation (min) 42 min    Equipment Utilized During Treatment Gait belt    Activity Tolerance Patient tolerated treatment well    Behavior During Therapy [facility] for tasks assessed/performed           Past Medical History:  Diagnosis Date  . Basal cell carcinoma 2019   RIGHT THUMB REGION  . Breast cancer ([place]) 06/27/2018   RIGHT  . Cancer ([place]) 1977   melanoma . SURGERY RIGHT ARM  . Hemorrhoids   . Hypertension   . Hypertriglyceridemia   . Insomnia   . Menopausal disorder   . Neutropenia ([place])   . Paresthesia   . Peripheral neuropathy   . Tinea corporis   . Vitamin D deficiency     Past Surgical History:  Procedure Laterality Date  . ABDOMINAL HYSTERECTOMY  1977  . BREAST BIOPSY Bilateral 1977   benign  . BREAST BIOPSY Right 06/27/2018   INVASIVE MAMMARY CARCINOMA, NO SPECIAL TYPE. 2 oclock ER/PR negative HER2 positive  . BREAST EXCISIONAL BIOPSY Right 07/23/2018   lumpectomy  . BREAST LUMPECTOMY WITH SENTINEL LYMPH NODE BIOPSY Right 07/23/2018   6 mm ER/PR negative, Her 2 neu 3+; node negative.  Surgeon: Robert Bellow, MD;  Location: [facility] ORS;  Service: General;  Laterality: Right;  . COLONOSCOPY  2007  . ELECTROCARDIOGRAM    . EYE SURGERY Bilateral    CATARACT EXTRACTIONS    Vitals:   07/14/20 1148  BP: (!) 141/65  Pulse: 86     Subjective Assessment - 07/15/20 0852    Subjective Patient reports the  exercises went pretty good at home. Patient states her toes are still sore. Patient asking about trying to return to [place] at the [facility] on [address]. Discussed returning to [employer] and standing near wall or having chair in front of her to touch as needed for balance. Patient continues to deny vertigo.    Pertinent History Pt reports that her dizziness symptoms began 03/01/20. She got up from her bed and she states that she started spinning. Feels like she is being pulled to the right or falling backwards. She underwent VNG testing at [facility] on 04/08/20 with abnormal saccades and smooth pursuit. She had a 60% caloric weakness in the right ear. Positive for L horizontal canal BPPV during VNG per report but not treated. She had a follow-up with Dr. Tami Ribas who referred her for VRT.    Limitations Walking    Diagnostic tests MRI ordered but not yet performed    Patient Stated Goals Decrease dizziness and improve her walking          Patient reports the exercises went pretty good at home. Patient states her toes are still sore. Patient asking about trying to return to [place] at the [facility] on [address] [place]. Discussed returning to [employer] and  standing near wall or having chair in front of her to touch as needed for balance. Patient continues to deny vertigo.   Neuromuscular Re-education:  Airex pad:  On firm surface and then on Airex pad, patient performed semi-tandem progressions with alternating lead leg with and without horizontal and vertical head turns with CGA in // bars.  Patient demonstrates good ankle strategies with these activities.  Patient required verbal cues for technique with semi-tandem progressions.  Slow Marching: Patient performed on firm surface, slow marching with 5 second holds with CGA.  Patient performed 2 sets of 10 reps.   Step Ups: Patient performed step up, over and backward return on 5" wooden step without upper extremity support  multiple reps, approximately 10 reps.  Patient performed multiple reps sideways step up over and return onto 5" step and then Airex pad without upper extremity support, approximately 10 reps.   Tandem walking: Patient performed semi-tandem walking 8' times 4 reps in // bars with CGA. Patient reports that she has been doing semi-tandem walking at home for HEP, but states she has not been standing next to anything. Discussed standing next to wall while performing for HEP for safety and patient in agreement and verbalized understanding.   Airex balance beam: On Airex balance beam, patient performed sidestepping left/right multiple reps times 5' each with CGA.  Performed on Airex balance beam tandem walking 5' times multiple reps with CGA and verbal cuing for foot placement.    PT Education - 07/15/20 0847    Education Details Added progression of standing on pillow for semi-tandem progressions with head turns for HEP. discussed technique for semi-tandem progressions    Person(s) Educated Patient    Methods Explanation;Verbal cues;Demonstration    Comprehension Verbalized understanding;Returned demonstration         Added progression of standing on pillow for semi-tandem progressions with head turns for HEP.    PT Short Term Goals - 07/09/20 0855      PT SHORT TERM GOAL #1   Title Pt will be independent with HEP in order to improve strength and balance in order to decrease dizziness and fall risk and improve function at home.    Time 4    Period Weeks    Status On-going    Target Date 06/13/20             PT Long Term Goals - 07/09/20 1610      PT LONG TERM GOAL #1   Title Pt will improve ABC by at least 13% in order to demonstrate clinically significant improvement in balance confidence.    Baseline 05/15/20: 61.3%; scored 81.3% on 07/07/2020    Time 8    Period Weeks    Status Achieved      PT LONG TERM GOAL #2   Title Pt will decrease DHI score by at least 18 points in order  to demonstrate clinically significant reduction in disability    Baseline 05/15/20: 24/100; scored 2/100 on 07/07/2020    Time 8    Period Weeks    Status Achieved      PT LONG TERM GOAL #3   Title Pt will improve FOTO to at least 83 in order to demonstrate significant improvement in function    Baseline 05/15/20: 76; scored 97/100 on 07/07/2020    Time 8    Period Weeks    Status Achieved      PT LONG TERM GOAL #4   Title Pt will report no further episodes of  vertigo with laying down, getting up, or rolling over in bed    Baseline patient denies vertigo with all positions on 07/07/2020    Time 8    Period Weeks    Status Achieved      PT LONG TERM GOAL #5   Title Patient will have improved Berg balance score of 53/56 or better to demonstrate improvement in patient's balance to help decrease patient's falls risk.    Baseline scored 49/56 on 07/07/2020    Time 4    Period Weeks    Status New    Target Date 08/05/20                 Plan - 07/15/20 0853    Clinical Impression Statement Patient displays excellent motivation during session. Patient reports compiance with home exercise program and reviewed safety with HEP this date. Patient would like to return to [employer] program that she was doing pre-Covid and encouraged patient to return and discussed safety measures. Patient demonstrated good improvement this last week with slow marching as she was able to increase from2-3 second holds to consistent 4-5 second holds. Patient challenged by tandem walking, single leg stance and narrow base of support activities. Patient would benefit from continued PT services to further address goals and balance deficits.    Personal Factors and Comorbidities Age;Comorbidity 2    Comorbidities HTN, breast CA, sleep disturbance    Examination-Activity Limitations Bed Mobility;Bend;Carry;Reach Overhead;Transfers    Examination-Participation Restrictions Community Activity;Driving;Volunteer     Stability/Clinical Decision Making Unstable/Unpredictable    Rehab Potential Good    PT Frequency 1x / week    PT Duration 8 weeks    PT Treatment/Interventions ADLs/Self Care Home Management;Aquatic Therapy;Biofeedback;Canalith Repostioning;Cryotherapy;Electrical Stimulation;Iontophoresis 49m/ml Dexamethasone;Moist Heat;Traction;Ultrasound;DME Instruction;Gait training;Stair training;Functional mobility training;Therapeutic activities;Therapeutic exercise;Balance training;Neuromuscular re-education;Cognitive remediation;Patient/family education;Manual techniques;Passive range of motion;Dry needling;Vestibular;Joint Manipulations    PT Next Visit Plan Update outcome measures and goals, recert vs discharge pending progress and plan of primary therapist. As needed recheck roll test and treat with Gufoni maneuver for presumed L horizontal cupulolithiasis.    PT Home Exercise Plan Access Code: [email or web address]; [email or web address] added slow marching to HEP    Consulted and Agree with Plan of Care Patient           Patient will benefit from skilled therapeutic intervention in order to improve the following deficits and impairments:  Decreased balance, Dizziness  Visit Diagnosis: Dizziness and giddiness  Unsteadiness on feet     Problem List Patient Active Problem List   Diagnosis Date Noted  . Lichen sclerosus 181/12/7508 . Actinic keratosis 09/21/2018  . Malignant neoplasm of upper-inner quadrant of right breast in female, estrogen receptor negative ([place] 07/11/2018  . Benign essential HTN 09/08/2017  . Adult BMI 30+ 07/06/2015  . Hemorrhoid 07/06/2015  . Disorder of peripheral nervous system 07/06/2015  . Avitaminosis D 07/06/2015  . Vaginal atrophy 07/06/2015  . Abnormal LFTs 08/21/2009  . Cannot sleep 05/23/2000  . Hypertriglyceridemia 05/19/2000   DLady DeutscherPT, DPT #[id number] Deutscher7/28/2021, 8:57 AM  [place] [facility]SERVICES [address]RT[address] [place] [address]Phone: [phone]  Fax:  [phone] Name: HDARLEEN MOFFITTMRN: [id number] of Birth: 510/10/1929

## 2020-07-22 ENCOUNTER — Other Ambulatory Visit: Payer: Self-pay

## 2020-07-22 ENCOUNTER — Ambulatory Visit: Payer: PPO | Attending: Unknown Physician Specialty

## 2020-07-22 DIAGNOSIS — R42 Dizziness and giddiness: Secondary | ICD-10-CM | POA: Diagnosis not present

## 2020-07-22 DIAGNOSIS — R2681 Unsteadiness on feet: Secondary | ICD-10-CM | POA: Diagnosis not present

## 2020-07-22 NOTE — Therapy (Signed)
[place] MAIN [facility] SERVICES [address] [place], [place], [address] Phone: [phone]   Fax:  [phone]  Physical Therapy Treatment  Patient Details  Name: Kelly Clark MRN: [id number] Date of Birth: 07-04-1929 Referring Provider (PT): Dr. Tami Ribas   Encounter Date: 07/22/2020   PT End of Session - 07/22/20 1351    Visit Number 11    Number of Visits 14    Date for PT Re-Evaluation 08/05/20    Authorization Type eval: 05/15/20, FOTO COMPLETED    PT Start Time 0147    PT Stop Time 0230    PT Time Calculation (min) 43 min    Equipment Utilized During Treatment Gait belt    Activity Tolerance Patient tolerated treatment well    Behavior During Therapy [facility] for tasks assessed/performed           Past Medical History:  Diagnosis Date  . Basal cell carcinoma 2019   RIGHT THUMB REGION  . Breast cancer ([place]) 06/27/2018   RIGHT  . Cancer ([place]) 1977   melanoma . SURGERY RIGHT ARM  . Hemorrhoids   . Hypertension   . Hypertriglyceridemia   . Insomnia   . Menopausal disorder   . Neutropenia ([place])   . Paresthesia   . Peripheral neuropathy   . Tinea corporis   . Vitamin D deficiency     Past Surgical History:  Procedure Laterality Date  . ABDOMINAL HYSTERECTOMY  1977  . BREAST BIOPSY Bilateral 1977   benign  . BREAST BIOPSY Right 06/27/2018   INVASIVE MAMMARY CARCINOMA, NO SPECIAL TYPE. 2 oclock ER/PR negative HER2 positive  . BREAST EXCISIONAL BIOPSY Right 07/23/2018   lumpectomy  . BREAST LUMPECTOMY WITH SENTINEL LYMPH NODE BIOPSY Right 07/23/2018   6 mm ER/PR negative, Her 2 neu 3+; node negative.  Surgeon: Robert Bellow, MD;  Location: [facility] ORS;  Service: General;  Laterality: Right;  . COLONOSCOPY  2007  . ELECTROCARDIOGRAM    . EYE SURGERY Bilateral    CATARACT EXTRACTIONS    There were no vitals filed for this visit.   Subjective Assessment - 07/22/20 1349    Subjective Patient reports doing well today and has  no updates. She notes she has not gone to the [facility] yet, but will once she finishes physical therapy. Patient continues to deny vertigo. No concerns or questions at this time.    Pertinent History Pt reports that her dizziness symptoms began 03/01/20. She got up from her bed and she states that she started spinning. Feels like she is being pulled to the right or falling backwards. She underwent VNG testing at [facility] on 04/08/20 with abnormal saccades and smooth pursuit. She had a 60% caloric weakness in the right ear. Positive for L horizontal canal BPPV during VNG per report but not treated. She had a follow-up with Dr. Tami Ribas who referred her for VRT.    Limitations Walking    Diagnostic tests MRI ordered but not yet performed    Patient Stated Goals Decrease dizziness and improve her walking    Currently in Pain? No/denies               TREATMENT      Neuromuscular Re-education: CGA with all exercises NuStep L2-3 x 5 minutes for warm-up during history, changes of interval throughout (3 minutes unbilled); Walking forwards/backwards with horizontal ball tosses 62' each; patient cued to walk slower backwards; Walking forwards/backwards with vertical ball tosses 75' each; Tapping ball off  cone x multiple bouts each leg, working on single leg balance, cued to not hit cone, improved with more reps. Balancing on one foot x 30 secs each foot, loss balance ~2 times. Agility ladder steps forward x 3 lengths, cued to not step on rings and to take big steps; Agility ladder side steps x 2 lengths, cued for proper foot positioning Agility ladder forward in and outs, verbal cues needed for proper technique and challenged single leg balance. Airex pad sandwich 6" step: lateral step up/down with focus on foot placement, spatial awareness, and negotiation of changing surfaces X 8 each direction Airex balance beams tandem stance x 3 lengths with no UE support, difficulty with single leg stance, loss  balance multiple times; patient notes hardest exercise. Airex balance beam side steps x 2 lengths, with no UE support, cued for proper foot positioning; Soccer kicks x multiple bouts, practicing single leg stance and weight shifts to kick ball, improve with more reps.   Pt educated throughout session about proper posture and technique with exercises. Improved exercise technique, movement at target joints, use of target muscles after min to mod verbal, visual, tactile cues.      Patient demonstrates good motivation during session today. Continued with dynamic balance exercises and dual tasking with gait. Patient is mildly unsafe walking backwards unless with CGA as she has an increased cadence with small step lengths. Patient cued to slow down speed. Patient challenged by tandem walking, single leg stance, and narrow base of support activities. Pt will benefit from PT services to address deficits in strength, balance, and mobility in order to return to full function at home.          PT Short Term Goals - 07/09/20 0855      PT SHORT TERM GOAL #1   Title Pt will be independent with HEP in order to improve strength and balance in order to decrease dizziness and fall risk and improve function at home.    Time 4    Period Weeks    Status On-going    Target Date 06/13/20             PT Long Term Goals - 07/09/20 6759      PT LONG TERM GOAL #1   Title Pt will improve ABC by at least 13% in order to demonstrate clinically significant improvement in balance confidence.    Baseline 05/15/20: 61.3%; scored 81.3% on 07/07/2020    Time 8    Period Weeks    Status Achieved      PT LONG TERM GOAL #2   Title Pt will decrease DHI score by at least 18 points in order to demonstrate clinically significant reduction in disability    Baseline 05/15/20: 24/100; scored 2/100 on 07/07/2020    Time 8    Period Weeks    Status Achieved      PT LONG TERM GOAL #3   Title Pt will improve FOTO to at  least 83 in order to demonstrate significant improvement in function    Baseline 05/15/20: 76; scored 97/100 on 07/07/2020    Time 8    Period Weeks    Status Achieved      PT LONG TERM GOAL #4   Title Pt will report no further episodes of vertigo with laying down, getting up, or rolling over in bed    Baseline patient denies vertigo with all positions on 07/07/2020    Time 8    Period Weeks  Status Achieved      PT LONG TERM GOAL #5   Title Patient will have improved Berg balance score of 53/56 or better to demonstrate improvement in patient's balance to help decrease patient's falls risk.    Baseline scored 49/56 on 07/07/2020    Time 4    Period Weeks    Status New    Target Date 08/05/20                 Plan - 07/22/20 1351    Clinical Impression Statement Patient demonstrates good motivation during session today. Continued with dynamic balance exercises and dual tasking with gait. Patient is mildly unsafe walking backwards unless with CGA as she has an increased cadence with small step lengths. Patient cued to slow down speed. Patient challenged by tandem walking, single leg stance, and narrow base of support activities. Pt will benefit from PT services to address deficits in strength, balance, and mobility in order to return to full function at home.    Personal Factors and Comorbidities Age;Comorbidity 2    Comorbidities HTN, breast CA, sleep disturbance    Examination-Activity Limitations Bed Mobility;Bend;Carry;Reach Overhead;Transfers    Examination-Participation Restrictions Community Activity;Driving;Volunteer    Stability/Clinical Decision Making Unstable/Unpredictable    Rehab Potential Good    PT Frequency 1x / week    PT Duration 8 weeks    PT Treatment/Interventions ADLs/Self Care Home Management;Aquatic Therapy;Biofeedback;Canalith Repostioning;Cryotherapy;Electrical Stimulation;Iontophoresis '4mg'$ /ml Dexamethasone;Moist Heat;Traction;Ultrasound;DME  Instruction;Gait training;Stair training;Functional mobility training;Therapeutic activities;Therapeutic exercise;Balance training;Neuromuscular re-education;Cognitive remediation;Patient/family education;Manual techniques;Passive range of motion;Dry needling;Vestibular;Joint Manipulations    PT Next Visit Plan Update outcome measures and goals, recert vs discharge pending progress and plan of primary therapist. As needed recheck roll test and treat with Gufoni maneuver for presumed L horizontal cupulolithiasis.    PT Home Exercise Plan Access Code: [email or web address]; [email or web address] added slow marching to HEP    Consulted and Agree with Plan of Care Patient           Patient will benefit from skilled therapeutic intervention in order to improve the following deficits and impairments:  Decreased balance, Dizziness  Visit Diagnosis: Dizziness and giddiness  Unsteadiness on feet     Problem List Patient Active Problem List   Diagnosis Date Noted  . Lichen sclerosus 16/09/9603  . Actinic keratosis 09/21/2018  . Malignant neoplasm of upper-inner quadrant of right breast in female, estrogen receptor negative ([place]) 07/11/2018  . Benign essential HTN 09/08/2017  . Adult BMI 30+ 07/06/2015  . Hemorrhoid 07/06/2015  . Disorder of peripheral nervous system 07/06/2015  . Avitaminosis D 07/06/2015  . Vaginal atrophy 07/06/2015  . Abnormal LFTs 08/21/2009  . Cannot sleep 05/23/2000  . Hypertriglyceridemia 05/19/2000    This entire session was performed under direct supervision and direction of a licensed therapist/therapist assistant . I have personally read, edited and approve of the note as written.   Phillips Grout PT, DPT, GCS  Kimmie Carless Slatten SPT Huprich,Jason 07/23/2020, 10:07 AM  [place] MAIN [facility] SERVICES [address] [place], [place], [address] Phone: [phone]   Fax:  [phone]  Name: Kelly Clark MRN: [id number] Date of Birth: July 28, 1929

## 2020-07-28 ENCOUNTER — Ambulatory Visit: Payer: PPO | Admitting: Podiatry

## 2020-07-28 ENCOUNTER — Other Ambulatory Visit: Payer: Self-pay

## 2020-07-28 ENCOUNTER — Ambulatory Visit: Payer: PPO | Admitting: Physical Therapy

## 2020-07-28 ENCOUNTER — Ambulatory Visit (INDEPENDENT_AMBULATORY_CARE_PROVIDER_SITE_OTHER): Payer: PPO

## 2020-07-28 ENCOUNTER — Encounter: Payer: PPO | Admitting: Physical Therapy

## 2020-07-28 DIAGNOSIS — M10072 Idiopathic gout, left ankle and foot: Secondary | ICD-10-CM

## 2020-07-28 DIAGNOSIS — M7752 Other enthesopathy of left foot: Secondary | ICD-10-CM

## 2020-07-28 MED ORDER — COLCHICINE 0.6 MG PO TABS: 0.6000 mg | ORAL_TABLET | Freq: Every day | ORAL | 0 refills | Status: DC

## 2020-07-28 NOTE — Progress Notes (Signed)
   HPI: 86 y.o. female presenting today as a new patient for evaluation of left foot pain.  Patient states that last Wednesday she began to notice pain in her left foot.  She denies injury or trauma to the area.  She states that "the pain snuck up out of nowhere".  She is currently taking Advil for the pain.  Over the past several days the pain began to become very painful and red and swollen to her great toe of her left foot.  She has never had a history of gout before.  She presents for further treatment and evaluation  Past Medical History:  Diagnosis Date  . Basal cell carcinoma 2019   RIGHT THUMB REGION  . Breast cancer ([place]) 06/27/2018   RIGHT  . Cancer ([place]) 1977   melanoma . SURGERY RIGHT ARM  . Hemorrhoids   . Hypertension   . Hypertriglyceridemia   . Insomnia   . Menopausal disorder   . Neutropenia ([place])   . Paresthesia   . Peripheral neuropathy   . Tinea corporis   . Vitamin D deficiency      Physical Exam: General: The patient is alert and oriented x3 in no acute distress.  Dermatology: Skin is warm, dry and supple bilateral lower extremities. Negative for open lesions or macerations.  Vascular: Palpable pedal pulses bilaterally.  Heavy edema with erythema noted encompassing the great toe joint left foot.  Capillary refill within normal limits.  Neurological: Epicritic and protective threshold grossly intact bilaterally.   Musculoskeletal Exam: Range of motion within normal limits to all pedal and ankle joints bilateral. Muscle strength 5/5 in all groups bilateral.  Erythema plus edema and pain on palpation noted to the great toe joint left foot findings consistent of gout.  Radiographic Exam:  Diffuse radiolucency noted throughout the foot consistent with osteopenia which would be expected due to the patient's given age. Joint spaces preserved. No fracture/dislocation/boney destruction.    Assessment: 1.  Acute idiopathic gout left great toe  Plan of Care:  1.  Patient evaluated. X-Rays reviewed.  2.  Injection of 0.5 cc Celestone Soluspan injected into the left great toe joint first MTPJ 3.  Prescription for colchicine 0.6 mg daily 4.  Postoperative shoe dispensed weightbearing as tolerated 5.  Return to clinic in 3 weeks      Edrick Kins, DPM [facility]  Dr. Edrick Kins, DPM    [address], [place] [address]                Office [phone]  Fax [phone]

## 2020-08-04 ENCOUNTER — Encounter: Payer: Self-pay | Admitting: Physical Therapy

## 2020-08-04 ENCOUNTER — Other Ambulatory Visit: Payer: Self-pay

## 2020-08-04 ENCOUNTER — Ambulatory Visit: Payer: PPO | Admitting: Physical Therapy

## 2020-08-04 DIAGNOSIS — R2681 Unsteadiness on feet: Secondary | ICD-10-CM

## 2020-08-04 DIAGNOSIS — R42 Dizziness and giddiness: Secondary | ICD-10-CM

## 2020-08-04 NOTE — Therapy (Signed)
[place] MAIN [facility] SERVICES [address] [place], [place], [address] Phone: [phone]   Fax:  [phone]  Physical Therapy Treatment/Discharge Summary Dates of Service:  05/15/20-08/04/20   Patient Details  Name: Kelly Clark MRN: [id number] Date of Birth: June 07, 1929 Referring Provider (PT): Dr. Tami Ribas   Encounter Date: 08/04/2020   PT End of Session - 08/04/20 1112    Visit Number 12    Number of Visits 14    Date for PT Re-Evaluation 08/05/20    Authorization Type eval: 05/15/20, FOTO COMPLETED    PT Start Time 1112    PT Stop Time 1200    PT Time Calculation (min) 48 min    Equipment Utilized During Treatment Gait belt    Activity Tolerance Patient tolerated treatment well    Behavior During Therapy [facility] for tasks assessed/performed           Past Medical History:  Diagnosis Date  . Basal cell carcinoma 2019   RIGHT THUMB REGION  . Breast cancer ([place]) 06/27/2018   RIGHT  . Cancer ([place]) 1977   melanoma . SURGERY RIGHT ARM  . Hemorrhoids   . Hypertension   . Hypertriglyceridemia   . Insomnia   . Menopausal disorder   . Neutropenia ([place])   . Paresthesia   . Peripheral neuropathy   . Tinea corporis   . Vitamin D deficiency     Past Surgical History:  Procedure Laterality Date  . ABDOMINAL HYSTERECTOMY  1977  . BREAST BIOPSY Bilateral 1977   benign  . BREAST BIOPSY Right 06/27/2018   INVASIVE MAMMARY CARCINOMA, NO SPECIAL TYPE. 2 oclock ER/PR negative HER2 positive  . BREAST EXCISIONAL BIOPSY Right 07/23/2018   lumpectomy  . BREAST LUMPECTOMY WITH SENTINEL LYMPH NODE BIOPSY Right 07/23/2018   6 mm ER/PR negative, Her 2 neu 3+; node negative.  Surgeon: Robert Bellow, MD;  Location: [facility] ORS;  Service: General;  Laterality: Right;  . COLONOSCOPY  2007  . ELECTROCARDIOGRAM    . EYE SURGERY Bilateral    CATARACT EXTRACTIONS    There were no vitals filed for this visit.   Subjective Assessment - 08/04/20 1112     Subjective Patient states she has not been able to do her exercises this last week due to her foot discomfort. Patient states she saw a podiatricts and she states she has gout in her left foot. Patient reports she had an injection for the foot and is on antibiotics. Patient reports her foot is feeling better and she is not having pain now.    Pertinent History Pt reports that her dizziness symptoms began 03/01/20. She got up from her bed and she states that she started spinning. Feels like she is being pulled to the right or falling backwards. She underwent VNG testing at [facility] on 04/08/20 with abnormal saccades and smooth pursuit. She had a 60% caloric weakness in the right ear. Positive for L horizontal canal BPPV during VNG per report but not treated. She had a follow-up with Dr. Tami Ribas who referred her for VRT.    Limitations Walking    Diagnostic tests MRI ordered but not yet performed    Patient Stated Goals Decrease dizziness and improve her walking             [facility] PT Assessment - 08/04/20 0001      Berg Balance Test   Sit to Stand Able to stand without using hands and stabilize independently  Standing Unsupported Able to stand safely 2 minutes    Sitting with Back Unsupported but Feet Supported on Floor or Stool Able to sit safely and securely 2 minutes    Stand to Sit Sits safely with minimal use of hands    Transfers Able to transfer safely, minor use of hands    Standing Unsupported with Eyes Closed Able to stand 10 seconds with supervision    Standing Unsupported with Feet Together Able to place feet together independently and stand 1 minute safely    From Standing, Reach Forward with Outstretched Arm Can reach confidently >25 cm (10")   10.5 inches   From Standing Position, Pick up Object from Floor Able to pick up shoe safely and easily    From Standing Position, Turn to Look Behind Over each Shoulder Looks behind from both sides and weight shifts well    Turn 360  Degrees Able to turn 360 degrees safely in 4 seconds or less    Standing Unsupported, Alternately Place Feet on Step/Stool Able to stand independently and safely and complete 8 steps in 20 seconds    Standing Unsupported, One Foot in Front Able to plae foot ahead of the other independently and hold 30 seconds    Standing on One Leg Able to lift leg independently and hold > 10 seconds   18 seconds   Total Score 54           Neuromuscular Re-education:  Patient states for two days this past week she was veering when she walked on Sunday and a little bit on Monday and states today she does not have it and states she feels "almost normal".  Patient states that her ankles and feet  have been so swollen that she has not been able to put on her regular shoes.  Patient talked with the podiatrist about this issue. Recommended that patient make an appointment with her primary care physician. Performed Berg balance test and patient improved from 40/56 to 54/56.   Discussed and reviewed HEP and discussed progress towards goals and discharge plans.  Patient reports that she had mild dizziness last night when she laid down on her left side.  On mat table, performed left and right Dix-Hallpike testing and it was negative with no nystagmus and patient denied vertigo, but patient reported mild dizziness with sitting up.  After Dix-Hallpike testing, performed ambulation with and without head turns and patient noted to have unsteadiness with left horizontal head turns.   Canalith Repositioning Maneuver: On inverted mat table performed left Dix-Hallpike testing and it was potentially positive with no nystagmus observed but patient reported 3/10 vertigo.  Performed left canalith repositioning maneuver (CRT). Patient declined to do a second maneuver as patient states that she has the home Epley handout that was provided and she states she feels confident that she can perform this at home until it fully clears.       PT Education - 08/04/20 1112    Education Details Reviewed HEP and reviewed left self-Epley maneuver; discussed Berg balance test results and compared to prior testing and progress towards goals; discussed discharge plans    Person(s) Educated Patient    Methods Explanation    Comprehension Verbalized understanding            PT Short Term Goals - 08/04/20 1225      PT SHORT TERM GOAL #1   Title Pt will be independent with HEP in order to improve strength and balance in order  to decrease dizziness and fall risk and improve function at home.    Time 4    Period Weeks    Status Achieved    Target Date 06/13/20             PT Long Term Goals - 08/04/20 1225      PT LONG TERM GOAL #1   Title Pt will improve ABC by at least 13% in order to demonstrate clinically significant improvement in balance confidence.    Baseline 05/15/20: 61.3%; scored 81.3% on 07/07/2020    Time 8    Period Weeks    Status Achieved      PT LONG TERM GOAL #2   Title Pt will decrease DHI score by at least 18 points in order to demonstrate clinically significant reduction in disability    Baseline 05/15/20: 24/100; scored 2/100 on 07/07/2020    Time 8    Period Weeks    Status Achieved      PT LONG TERM GOAL #3   Title Pt will improve FOTO to at least 83 in order to demonstrate significant improvement in function    Baseline 05/15/20: 76; scored 97/100 on 07/07/2020    Time 8    Period Weeks    Status Achieved      PT LONG TERM GOAL #4   Title Pt will report no further episodes of vertigo with laying down, getting up, or rolling over in bed    Baseline patient denies vertigo with all positions on 07/07/2020    Time 8    Period Weeks    Status Achieved      PT LONG TERM GOAL #5   Title Patient will have improved Berg balance score of 53/56 or better to demonstrate improvement in patient's balance to help decrease patient's falls risk.    Baseline scored 49/56 on 7/20/202; scored 54/56 on  08/04/2020    Time 4    Period Weeks    Status Achieved                 Plan - 08/04/20 1238    Clinical Impression Statement Patient scored 54/56 on the Berg balance test this date. Patient has met all short and long term goals as set on plan of care. Patient plans on continuing to perform her home exercise program and to return to [place] program upon discharge. Patient did report that she noticed some mild veering when walking on Sunday and had brief dizziness when she laid down on her left side last night. Performed Dix-Hallpike testing and noted no nystagmus, but patient reported 3/10 dizziness with left testing on inverted mat table and performed one Epley maneuver. Patient has handout for self-Epley maneuver and patient plans to perform left self-Epley maneuver until her symptoms clear. Patient in agreement with discharge from PT services at this time with all goals met.    Personal Factors and Comorbidities Age;Comorbidity 2    Comorbidities HTN, breast CA, sleep disturbance    Examination-Activity Limitations Bed Mobility;Bend;Carry;Reach Overhead;Transfers    Examination-Participation Restrictions Community Activity;Driving;Volunteer    Stability/Clinical Decision Making Unstable/Unpredictable    Rehab Potential Good    PT Frequency 1x / week    PT Duration 8 weeks    PT Treatment/Interventions ADLs/Self Care Home Management;Aquatic Therapy;Biofeedback;Canalith Repostioning;Cryotherapy;Electrical Stimulation;Iontophoresis '4mg'$ /ml Dexamethasone;Moist Heat;Traction;Ultrasound;DME Instruction;Gait training;Stair training;Functional mobility training;Therapeutic activities;Therapeutic exercise;Balance training;Neuromuscular re-education;Cognitive remediation;Patient/family education;Manual techniques;Passive range of motion;Dry needling;Vestibular;Joint Manipulations    PT Next Visit Plan Update outcome measures and goals,  recert vs discharge pending progress and plan  of primary therapist. As needed recheck roll test and treat with Gufoni maneuver for presumed L horizontal cupulolithiasis.    PT Home Exercise Plan Access Code: [email or web address]; [email or web address] added slow marching to HEP    Consulted and Agree with Plan of Care Patient           Patient will benefit from skilled therapeutic intervention in order to improve the following deficits and impairments:  Decreased balance, Dizziness  Visit Diagnosis: Dizziness and giddiness  Unsteadiness on feet     Problem List Patient Active Problem List   Diagnosis Date Noted  . Lichen sclerosus 25/04/3975  . Actinic keratosis 09/21/2018  . Malignant neoplasm of upper-inner quadrant of right breast in female, estrogen receptor negative ([place]) 07/11/2018  . Benign essential HTN 09/08/2017  . Adult BMI 30+ 07/06/2015  . Hemorrhoid 07/06/2015  . Disorder of peripheral nervous system 07/06/2015  . Avitaminosis D 07/06/2015  . Vaginal atrophy 07/06/2015  . Abnormal LFTs 08/21/2009  . Cannot sleep 05/23/2000  . Hypertriglyceridemia 05/19/2000   Lady Deutscher PT, DPT [phone] Lady Deutscher 08/04/2020, 12:45 PM  [place] MAIN [facility] SERVICES [address] [place], [place], [address] Phone: [phone]   Fax:  [phone]  Name: Kelly Clark MRN: [id number] Date of Birth: 11-17-1929

## 2020-08-18 ENCOUNTER — Other Ambulatory Visit: Payer: Self-pay

## 2020-08-18 ENCOUNTER — Ambulatory Visit: Payer: PPO | Admitting: Podiatry

## 2020-08-18 DIAGNOSIS — M10072 Idiopathic gout, left ankle and foot: Secondary | ICD-10-CM

## 2020-08-18 NOTE — Progress Notes (Signed)
  Subjective:  Patient ID: Enis Gash, female    DOB: 26-Mar-1929,  MRN: [id number]  Chief Complaint  Patient presents with  . Follow-up    3WK F/U- Pt states she is doing alot better- overall feels the improvement withinjections and medication that was sent-     86 y.o. female presents with the above complaint. History confirmed with patient. Doing much better after injection and colchicine  Objective:  Physical Exam: warm, good capillary refill, no trophic changes or ulcerative lesions, normal DP and PT pulses and normal sensory exam. Left Foot: no edema or warmth or pain today  Assessment:   1. Acute idiopathic gout involving toe of left foot      Plan:  Patient was evaluated and treated and all questions answered.   - Discussed etiology and treatment for acute and chronic gout - Info on low purine diet given - Return PRN if flares  Return if symptoms worsen or fail to improve.

## 2020-08-18 NOTE — Patient Instructions (Signed)
Low-Purine Eating Plan A low-purine eating plan involves making food choices to limit your intake of purine. Purine is a kind of uric acid. Too much uric acid in your blood can cause certain conditions, such as gout and kidney stones. Eating a low-purine diet can help control these conditions. What are tips for following this plan? Reading food labels   Avoid foods with saturated or Trans fat.  Check the ingredient list of grains-based foods, such as bread and cereal, to make sure that they contain whole grains.  Check the ingredient list of sauces or soups to make sure they do not contain meat or fish.  When choosing soft drinks, check the ingredient list to make sure they do not contain high-fructose corn syrup. Shopping  Buy plenty of fresh fruits and vegetables.  Avoid buying canned or fresh fish.  Buy dairy products labeled as low-fat or nonfat.  Avoid buying premade or processed foods. These foods are often high in fat, salt (sodium), and added sugar. Cooking  Use olive oil instead of butter when cooking. Oils like olive oil, canola oil, and sunflower oil contain healthy fats. Meal planning  Learn which foods do or do not affect you. If you find out that a food tends to cause your gout symptoms to flare up, avoid eating that food. You can enjoy foods that do not cause problems. If you have any questions about a food item, talk with your dietitian or health care provider.  Limit foods high in fat, especially saturated fat. Fat makes it harder for your body to get rid of uric acid.  Choose foods that are lower in fat and are lean sources of protein. General guidelines  Limit alcohol intake to no more than 1 drink a day for nonpregnant women and 2 drinks a day for men. One drink equals 12 oz of beer, 5 oz of wine, or 1 oz of hard liquor. Alcohol can affect the way your body gets rid of uric acid.  Drink plenty of water to keep your urine clear or pale yellow. Fluids can help  remove uric acid from your body.  If directed by your health care provider, take a vitamin C supplement.  Work with your health care provider and dietitian to develop a plan to achieve or maintain a healthy weight. Losing weight can help reduce uric acid in your blood. What foods are recommended? The items listed may not be a complete list. Talk with your dietitian about what dietary choices are best for you. Foods low in purines Foods low in purines do not need to be limited. These include:  All fruits.  All low-purine vegetables, pickles, and olives.  Breads, pasta, rice, cornbread, and popcorn. Cake and other baked goods.  All dairy foods.  Eggs, nuts, and nut butters.  Spices and condiments, such as salt, herbs, and vinegar.  Plant oils, butter, and margarine.  Water, sugar-free soft drinks, tea, coffee, and cocoa.  Vegetable-based soups, broths, sauces, and gravies. Foods moderate in purines Foods moderate in purines should be limited to the amounts listed.   cup of asparagus, cauliflower, spinach, mushrooms, or green peas, each day.  2/3 cup uncooked oatmeal, each day.   cup dry wheat bran or wheat germ, each day.  2-3 ounces of meat or poultry, each day.  4-6 ounces of shellfish, such as crab, lobster, oysters, or shrimp, each day.  1 cup cooked beans, peas, or lentils, each day.  Soup, broths, or bouillon made from meat or   fish. Limit these foods as much as possible. What foods are not recommended? The items listed may not be a complete list. Talk with your dietitian about what dietary choices are best for you. Limit your intake of foods high in purines, including:  Beer and other alcohol.  Meat-based gravy or sauce.  Canned or fresh fish, such as: ? Anchovies, sardines, herring, and tuna. ? Mussels and scallops. ? Codfish, trout, and haddock.  Berniece Salines.  Organ meats, such as: ? Liver or kidney. ? Tripe. ? Sweetbreads (thymus gland or  pancreas).  Wild Clinical biochemist.  Yeast or yeast extract supplements.  Drinks sweetened with high-fructose corn syrup. Summary  Eating a low-purine diet can help control conditions caused by too much uric acid in the body, such as gout or kidney stones.  Choose low-purine foods, limit alcohol, and limit foods high in fat.  You will learn over time which foods do or do not affect you. If you find out that a food tends to cause your gout symptoms to flare up, avoid eating that food. This information is not intended to replace advice given to you by your health care provider. Make sure you discuss any questions you have with your health care provider. Document Revised: 11/17/2017 Document Reviewed: 01/18/2017 Elsevier Patient Education  [place].  Gout  Gout is painful swelling of your joints. Gout is a type of arthritis. It is caused by having too much uric acid in your body. Uric acid is a chemical that is made when your body breaks down substances called purines. If your body has too much uric acid, sharp crystals can form and build up in your joints. This causes pain and swelling. Gout attacks can happen quickly and be very painful (acute gout). Over time, the attacks can affect more joints and happen more often (chronic gout). What are the causes?  Too much uric acid in your blood. This can happen because: ? Your kidneys do not remove enough uric acid from your blood. ? Your body makes too much uric acid. ? You eat too many foods that are high in purines. These foods include organ meats, some seafood, and beer.  Trauma or stress. What increases the risk?  Having a family history of gout.  Being female and middle-aged.  Being female and having gone through menopause.  Being very overweight (obese).  Drinking alcohol, especially beer.  Not having enough water in the body (being dehydrated).  Losing weight too quickly.  Having an organ transplant.  Having lead  poisoning.  Taking certain medicines.  Having kidney disease.  Having a skin condition called psoriasis. What are the signs or symptoms? An attack of acute gout usually happens in just one joint. The most common place is the big toe. Attacks often start at night. Other joints that may be affected include joints of the feet, ankle, knee, fingers, wrist, or elbow. Symptoms of an attack may include:  Very bad pain.  Warmth.  Swelling.  Stiffness.  Shiny, red, or purple skin.  Tenderness. The affected joint may be very painful to touch.  Chills and fever. Chronic gout may cause symptoms more often. More joints may be involved. You may also have white or yellow lumps (tophi) on your hands or feet or in other areas near your joints. How is this treated?  Treatment for this condition has two phases: treating an acute attack and preventing future attacks.  Acute gout treatment may include: ? NSAIDs. ? Steroids. These  are taken by mouth or injected into a joint. ? Colchicine. This medicine relieves pain and swelling. It can be given by mouth or through an IV tube.  Preventive treatment may include: ? Taking small doses of NSAIDs or colchicine daily. ? Using a medicine that reduces uric acid levels in your blood. ? Making changes to your diet. You may need to see a food expert (dietitian) about what to eat and drink to prevent gout. Follow these instructions at home: During a gout attack   If told, put ice on the painful area: ? Put ice in a plastic bag. ? Place a towel between your skin and the bag. ? Leave the ice on for 20 minutes, 2-3 times a day.  Raise (elevate) the painful joint above the level of your heart as often as you can.  Rest the joint as much as possible. If the joint is in your leg, you may be given crutches.  Follow instructions from your doctor about what you cannot eat or drink. Avoiding future gout attacks  Eat a low-purine diet. Avoid foods and drinks  such as: ? Liver. ? Kidney. ? Anchovies. ? Asparagus. ? Herring. ? Mushrooms. ? Mussels. ? Beer.  Stay at a healthy weight. If you want to lose weight, talk with your doctor. Do not lose weight too fast.  Start or continue an exercise plan as told by your doctor. Eating and drinking  Drink enough fluids to keep your pee (urine) pale yellow.  If you drink alcohol: ? Limit how much you use to:  0-1 drink a day for women.  0-2 drinks a day for men. ? Be aware of how much alcohol is in your drink. In the [place] one drink equals one 12 oz bottle of beer (355 mL), one 5 oz glass of wine (148 mL), or one 1 oz glass of hard liquor (44 mL). General instructions  Take over-the-counter and prescription medicines only as told by your doctor.  Do not drive or use heavy machinery while taking prescription pain medicine.  Return to your normal activities as told by your doctor. Ask your doctor what activities are safe for you.  Keep all follow-up visits as told by your doctor. This is important. Contact a doctor if:  You have another gout attack.  You still have symptoms of a gout attack after 10 days of treatment.  You have problems (side effects) because of your medicines.  You have chills or a fever.  You have burning pain when you pee (urinate).  You have pain in your lower back or belly. Get help right away if:  You have very bad pain.  Your pain cannot be controlled.  You cannot pee. Summary  Gout is painful swelling of the joints.  The most common site of pain is the big toe, but it can affect other joints.  Medicines and avoiding some foods can help to prevent and treat gout attacks. This information is not intended to replace advice given to you by your health care provider. Make sure you discuss any questions you have with your health care provider. Document Revised: 06/27/2018 Document Reviewed: 06/27/2018 Elsevier Patient Education  [place].

## 2020-09-08 ENCOUNTER — Other Ambulatory Visit: Payer: Self-pay

## 2020-09-08 ENCOUNTER — Other Ambulatory Visit: Payer: PPO

## 2020-09-08 DIAGNOSIS — Z20822 Contact with and (suspected) exposure to covid-19: Secondary | ICD-10-CM | POA: Diagnosis not present

## 2020-09-10 ENCOUNTER — Ambulatory Visit: Payer: Self-pay | Admitting: Family Medicine

## 2020-09-10 LAB — NOVEL CORONAVIRUS, NAA: SARS-CoV-2, NAA: NOT DETECTED

## 2020-09-10 LAB — SARS-COV-2, NAA 2 DAY TAT

## 2020-09-10 LAB — SPECIMEN STATUS REPORT

## 2020-09-10 NOTE — Telephone Encounter (Signed)
Pt states had visit with [facility] nurse and "I was told to see my PCP within 24 hours."  Reports "Very mild chest heaviness, I don't even notice it unless I'm idle." States went to [facility] last Thursday, "Was my usual energetic self, the next morning felt yucky."  Chest heaviness onset at that time. Denies injury. Denies chest pain, no SOB. "Not like an elephant is on my chest, just feels abnormal." Also reports LGT for 1 week 98.8-99.0. Reports increased fatigue, less energy than usual. States BP is "Good." Also reports covid tested past Tuesday, received results this AM, negative.   Pt called during lunch break. SHe is willing to see another provider. Advised ED/UC if symptoms worsen.  Please advise:  [phone]  Reason for Disposition . [1] Chest pain lasts > 5 minutes AND [2] occurred > 3 days ago (72 hours) AND [3] NO chest pain or cardiac symptoms now  Answer Assessment - Initial Assessment Questions 1. LOCATION: "Where does it hurt?"       "Feels heavy" 2. RADIATION: "Does the pain go anywhere else?" (e.g., into neck, jaw, arms, back)     No pain 3. ONSET: "When did the chest pain begin?" (Minutes, hours or days)      Last Thursday 4. PATTERN "Does the pain come and go, or has it been constant since it started?"  "Does it get worse with exertion?"      Intermittent, no worse with exertion 5. DURATION: "How long does it last" (e.g., seconds, minutes, hours)     Not sure. I don't notice when busy." 6. SEVERITY: "How bad is the pain?"  (e.g., Scale 1-10; mild, moderate, or severe)    - MILD (1-3): doesn't interfere with normal activities     - MODERATE (4-7): interferes with normal activities or awakens from sleep    - SEVERE (8-10): excruciating pain, unable to do any normal activities      Dull, "I don't notice unless I'm Idle."  7. CARDIAC RISK FACTORS: "Do you have any history of heart problems or risk factors for heart disease?" (e.g., angina, prior heart attack;  diabetes, high blood pressure, high cholesterol, smoker, or strong family history of heart disease)     *No Answer* 8. PULMONARY RISK FACTORS: "Do you have any history of lung disease?"  (e.g., blood clots in lung, asthma, emphysema, birth control pills)     *No Answer* 9. CAUSE: "What do you think is causing the chest pain?"     Not sure 10. OTHER SYMPTOMS: "Do you have any other symptoms?" (e.g., dizziness, nausea, vomiting, sweating, fever, difficulty breathing, cough)       LGT  98.8-99.0  Protocols used: CHEST PAIN-A-AH

## 2020-09-10 NOTE — Telephone Encounter (Signed)
Appointment tomorrow if still feeling bad.  I can see her next week but not before.

## 2020-09-11 ENCOUNTER — Telehealth: Payer: Self-pay | Admitting: Family Medicine

## 2020-09-11 NOTE — Telephone Encounter (Signed)
LMOVM for pt to return call 

## 2020-09-11 NOTE — Telephone Encounter (Signed)
Pt states she is feeling much better.  Pt states this is the first day in 7 days that she does not have fever.  Pt states she had covid test last week and it was negative, so she does not believe it was covid. Pt states she does not feel she needs to come in.  Pt so very excited she is feeling better. And thanks you for calling!

## 2020-09-14 ENCOUNTER — Ambulatory Visit: Payer: Self-pay

## 2020-09-14 NOTE — Telephone Encounter (Signed)
Pt. Reports she started having "nausea, headache, chest heaviness." "I'm not in pain." "Heaviness is 3/10." Denies shortness of breath. Reports Tylenol "makes the headache and chest heaviness go away." Discomfort right side of chest,no radiation. No pain at present. Has an appointment for tomorrow. Instructed to go to ED for any worsening of symptoms.  Reason for Disposition . [1] Chest pain lasting < 5 minutes AND [2] NO chest pain or cardiac symptoms (e.g., breathing difficulty, sweating) now (Exception: chest pains that last only a few seconds)  Answer Assessment - Initial Assessment Questions 1. LOCATION: "Where does it hurt?"       Right side  2. RADIATION: "Does the pain go anywhere else?" (e.g., into neck, jaw, arms, back)     No 3. ONSET: "When did the chest pain begin?" (Minutes, hours or days)      Last week 4. PATTERN "Does the pain come and go, or has it been constant since it started?"  "Does it get worse with exertion?"      Comes and goes 5. DURATION: "How long does it last" (e.g., seconds, minutes, hours)     Minutes 6. SEVERITY: "How bad is the pain?"  (e.g., Scale 1-10; mild, moderate, or severe)    - MILD (1-3): doesn't interfere with normal activities     - MODERATE (4-7): interferes with normal activities or awakens from sleep    - SEVERE (8-10): excruciating pain, unable to do any normal activities       3 7. CARDIAC RISK FACTORS: "Do you have any history of heart problems or risk factors for heart disease?" (e.g., angina, prior heart attack; diabetes, high blood pressure, high cholesterol, smoker, or strong family history of heart disease)     No 8. PULMONARY RISK FACTORS: "Do you have any history of lung disease?"  (e.g., blood clots in lung, asthma, emphysema, birth control pills)     No 9. CAUSE: "What do you think is causing the chest pain?"     Unsure 10. OTHER SYMPTOMS: "Do you have any other symptoms?" (e.g., dizziness, nausea, vomiting, sweating, fever,  difficulty breathing, cough)       Nausea and headache 11. PREGNANCY: "Is there any chance you are pregnant?" "When was your last menstrual period?"       No  Protocols used: CHEST PAIN-A-AH

## 2020-09-15 ENCOUNTER — Ambulatory Visit (INDEPENDENT_AMBULATORY_CARE_PROVIDER_SITE_OTHER): Payer: PPO | Admitting: Physician Assistant

## 2020-09-15 ENCOUNTER — Other Ambulatory Visit: Payer: Self-pay

## 2020-09-15 ENCOUNTER — Encounter: Payer: Self-pay | Admitting: Physician Assistant

## 2020-09-15 VITALS — BP 133/80 | HR 89 | Temp 98.1°F | Wt 179.8 lb

## 2020-09-15 DIAGNOSIS — K30 Functional dyspepsia: Secondary | ICD-10-CM

## 2020-09-15 DIAGNOSIS — R140 Abdominal distension (gaseous): Secondary | ICD-10-CM | POA: Diagnosis not present

## 2020-09-15 DIAGNOSIS — R0789 Other chest pain: Secondary | ICD-10-CM | POA: Diagnosis not present

## 2020-09-15 MED ORDER — SUCRALFATE 1 G PO TABS: 1.0000 g | ORAL_TABLET | Freq: Three times a day (TID) | ORAL | 0 refills | Status: DC

## 2020-09-15 NOTE — Progress Notes (Signed)
Established patient visit   Patient: Kelly Clark   DOB: Aug 30, 1929   86 y.o. Female  MRN: [id number] Visit Date: 09/15/2020  Today's healthcare provider: Trinna Post, PA-C   Chief Complaint  Patient presents with  . Headache  . Bloated  I,Porsha C McClurkin,acting as a Education administrator for [employer], PA-C.,have documented all relevant documentation on the behalf of Trinna Post, PA-C,as directed by  Trinna Post, PA-C while in the presence of Trinna Post, PA-C.   Subjective    Headache  This is a new problem. The current episode started 1 to 4 weeks ago. The problem occurs intermittently. The pain is located in the frontal and right unilateral region. The pain does not radiate. The pain quality is not similar to prior headaches. The quality of the pain is described as dull. The pain is at a severity of 3/10. The pain is mild. Pertinent negatives include no abdominal pain, back pain, blurred vision, dizziness, eye pain, eye redness, eye watering, hearing loss, insomnia, loss of balance, numbness, phonophobia, photophobia, scalp tenderness, tinnitus or visual change. Nothing aggravates the symptoms. She has tried acetaminophen for the symptoms. The treatment provided mild relief.  Abdominal bloating Patient reports she has abdominal bloating since 09/03/2020. Patient reports that belching helps with her abdominal bloating. She denies any chest pain or pressure however several days ago she reports a heaviness on her chest. She can do her housework and chores like normally. She has not taken anything for the symptoms.   She reports normally she feels quite well and goes to an exercise class on a regular basis.     Medications: Outpatient Medications Prior to Visit  Medication Sig  . amLODipine (NORVASC) 10 MG tablet TAKE 1 TABLET(10 MG) BY MOUTH DAILY  . Cholecalciferol (VITAMIN D3) 10000 units TABS Take 10,000 Units by mouth at bedtime.  . clobetasol ointment  (TEMOVATE) 7.67 % Apply 1 application topically once a week. Applied to affected area of vagina and rectum   . gabapentin (NEURONTIN) 600 MG tablet TAKE 1 TABLET BY MOUTH AT BEDTIME  . Ginkgo Biloba 40 MG TABS Take 1 tablet by mouth daily. @@ noon  . hydrochlorothiazide (HYDRODIURIL) 12.5 MG tablet TAKE 1 TABLET BY MOUTH ONCE DAILY  . latanoprost (XALATAN) 0.005 % ophthalmic solution Place 1 drop into both eyes at bedtime.   . Multiple Vitamin (MULTIVITAMIN) tablet Take 1 tablet by mouth daily.  . multivitamin-lutein (OCUVITE-LUTEIN) CAPS capsule Take 1 capsule by mouth every morning.   . polyethylene glycol powder (MIRALAX) powder Take 17 g by mouth at bedtime.   Marland Kitchen RESVERATROL-GRAPE PO Take 1 capsule by mouth daily. CoQ10 Resveratrol & Grape Seed  . colchicine 0.6 MG tablet Take 1 tablet (0.6 mg total) by mouth daily. Take 2 tablets (1.2 mg total) by mouth stat.  Then 1 tablet by mouth daily (Patient not taking: Reported on 09/15/2020)  . meclizine (ANTIVERT) 25 MG tablet Take 25 mg by mouth 3 (three) times daily as needed. (Patient not taking: Reported on 09/15/2020)  . Omega-3 Fatty Acids (FISH OIL) 1000 MG CAPS Take by mouth. (Patient not taking: Reported on 09/15/2020)   No facility-administered medications prior to visit.    Review of Systems  HENT: Negative for hearing loss and tinnitus.   Eyes: Negative for blurred vision, photophobia, pain and redness.  Gastrointestinal: Negative for abdominal pain.  Musculoskeletal: Negative for back pain.  Neurological: Positive for headaches. Negative for dizziness, numbness and  loss of balance.  Psychiatric/Behavioral: The patient does not have insomnia.       Objective    There were no vitals taken for this visit.   Physical Exam Constitutional:      Appearance: Normal appearance.  Cardiovascular:     Rate and Rhythm: Normal rate and regular rhythm.     Heart sounds: Normal heart sounds.  Pulmonary:     Effort: Pulmonary effort is  normal.     Breath sounds: Normal breath sounds.  Abdominal:     General: Bowel sounds are normal.     Palpations: Abdomen is soft.  Skin:    General: Skin is warm and dry.  Neurological:     General: No focal deficit present.     Mental Status: She is alert and oriented to person, place, and time. Mental status is at baseline.  Psychiatric:        Mood and Affect: Mood normal.        Behavior: Behavior normal.       No results found for any visits on 09/15/20.  Assessment & Plan    1. Chest pressure  EKG stable compared to prior.  - EKG 12-Lead  2. Bloating  Suspect indigestion, can take H2RA like PEPcid 30 min before meal on empty stomach and sucralfate as below. If issue is not improving, she should be re-evaluated.  - sucralfate (CARAFATE) 1 g tablet; Take 1 tablet (1 g total) by mouth 4 (four) times daily -  with meals and at bedtime.  Dispense: 120 tablet; Refill: 0  3. Indigestion  - sucralfate (CARAFATE) 1 g tablet; Take 1 tablet (1 g total) by mouth 4 (four) times daily -  with meals and at bedtime.  Dispense: 120 tablet; Refill: 0    No follow-ups on file.      ITrinna Post, PA-C, have reviewed all documentation for this visit. The documentation on 09/16/20 for the exam, diagnosis, procedures, and orders are all accurate and complete.  The entirety of the information documented in the History of Present Illness, Review of Systems and Physical Exam were personally obtained by me. Portions of this information were initially documented by [facility] and reviewed by me for thoroughness and accuracy.   I spent 30 minutes dedicated to the care of this patient on the date of this encounter to include pre-visit review of records, face-to-face time with the patient discussing chest pressure, indgestion, and post visit ordering of testing.    Paulene Floor  [facility] [phone] (phone) [phone] (fax)  [place]

## 2020-09-15 NOTE — Telephone Encounter (Signed)
See follow up message.

## 2020-09-15 NOTE — Telephone Encounter (Signed)
Noted  

## 2020-09-15 NOTE — Patient Instructions (Addendum)
Pepcid (famotidine) 20 mg daily 30 minutes before a meal - this is over the counter Carafate - this is a prescription.

## 2020-09-23 NOTE — Progress Notes (Signed)
Subjective:   Kelly Clark is a 86 y.o. female who presents for Medicare Annual (Subsequent) preventive examination.  I connected with [place] today by telephone and verified that I am speaking with the correct person using two identifiers. Location patient: home Location provider: work Persons participating in the virtual visit: patient, provider.   I discussed the limitations, risks, security and privacy concerns of performing an evaluation and management service by telephone and the availability of in person appointments. I also discussed with the patient that there may be a patient responsible charge related to this service. The patient expressed understanding and verbally consented to this telephonic visit.    Interactive audio and video telecommunications were attempted between this provider and patient, however failed, due to patient having technical difficulties OR patient did not have access to video capability.  We continued and completed visit with audio only.   Review of Systems    N/A  Cardiac Risk Factors include: advanced age (>51men, >26 women);hypertension     Objective:    There were no vitals filed for this visit. There is no height or weight on file to calculate BMI.  Advanced Directives 09/24/2020 09/23/2019 11/02/2018 09/19/2018 07/23/2018 07/17/2018 07/09/2018  Does Patient Have a Medical Advance Directive? Yes Yes Yes Yes Yes Yes Yes  Type of Paramedic of [place];Living will [place];Living will - [place];Living will [place];Living will [place];Living will [place];Living will  Does patient want to make changes to medical advance directive? - - - - No - Patient declined No - Patient declined -  Copy of [place] in Chart? No - copy requested Yes - validated most recent copy scanned in chart (See row information) - No  - copy requested Yes Yes -    Current Medications (verified) Outpatient Encounter Medications as of 09/24/2020  Medication Sig  . amLODipine (NORVASC) 10 MG tablet TAKE 1 TABLET(10 MG) BY MOUTH DAILY  . Cholecalciferol (VITAMIN D3) 10000 units TABS Take 10,000 Units by mouth at bedtime.  . clobetasol ointment (TEMOVATE) 7.09 % Apply 1 application topically once a week. Applied to affected area of vagina and rectum   . gabapentin (NEURONTIN) 600 MG tablet TAKE 1 TABLET BY MOUTH AT BEDTIME  . Ginkgo Biloba 40 MG TABS Take 1 tablet by mouth daily. @@ noon  . hydrochlorothiazide (HYDRODIURIL) 12.5 MG tablet TAKE 1 TABLET BY MOUTH ONCE DAILY  . latanoprost (XALATAN) 0.005 % ophthalmic solution Place 1 drop into both eyes at bedtime.   . Multiple Vitamin (MULTIVITAMIN) tablet Take 1 tablet by mouth daily.  . multivitamin-lutein (OCUVITE-LUTEIN) CAPS capsule Take 1 capsule by mouth every morning.   . polyethylene glycol powder (MIRALAX) powder Take 17 g by mouth at bedtime.   Marland Kitchen RESVERATROL-GRAPE PO Take 1 capsule by mouth daily. CoQ10 Resveratrol & Grape Seed  . colchicine 0.6 MG tablet Take 1 tablet (0.6 mg total) by mouth daily. Take 2 tablets (1.2 mg total) by mouth stat.  Then 1 tablet by mouth daily (Patient not taking: Reported on 09/15/2020)  . meclizine (ANTIVERT) 25 MG tablet Take 25 mg by mouth 3 (three) times daily as needed. (Patient not taking: Reported on 09/15/2020)  . Omega-3 Fatty Acids (FISH OIL) 1000 MG CAPS Take by mouth. (Patient not taking: Reported on 09/15/2020)  . sucralfate (CARAFATE) 1 g tablet Take 1 tablet (1 g total) by mouth 4 (four) times daily -  with meals and at bedtime.   No facility-administered encounter medications on file as of 09/24/2020.    Allergies (verified) Patient has no known allergies.   History: Past Medical History:  Diagnosis Date  . Basal cell carcinoma 2019   RIGHT THUMB REGION  . Breast cancer ([place]) 06/27/2018   RIGHT  . Cancer ([place]) 1977     melanoma . SURGERY RIGHT ARM  . Hemorrhoids   . Hypertension   . Hypertriglyceridemia   . Insomnia   . Menopausal disorder   . Neutropenia ([place])   . Paresthesia   . Peripheral neuropathy   . Tinea corporis   . Vitamin D deficiency    Past Surgical History:  Procedure Laterality Date  . ABDOMINAL HYSTERECTOMY  1977  . BREAST BIOPSY Bilateral 1977   benign  . BREAST BIOPSY Right 06/27/2018   INVASIVE MAMMARY CARCINOMA, NO SPECIAL TYPE. 2 oclock ER/PR negative HER2 positive  . BREAST EXCISIONAL BIOPSY Right 07/23/2018   lumpectomy  . BREAST LUMPECTOMY WITH SENTINEL LYMPH NODE BIOPSY Right 07/23/2018   6 mm ER/PR negative, Her 2 neu 3+; node negative.  Surgeon: Robert Bellow, MD;  Location: [facility] ORS;  Service: General;  Laterality: Right;  . COLONOSCOPY  2007  . ELECTROCARDIOGRAM    . EYE SURGERY Bilateral    CATARACT EXTRACTIONS   Family History  Problem Relation Age of Onset  . Heart attack Mother   . Congestive Heart Failure Mother   . Stroke Father   . Colon cancer Sister 37  . Throat cancer Maternal Grandmother 53  . Healthy Daughter   . Breast cancer Neg Hx    Social History   Socioeconomic History  . Marital status: Widowed    Spouse name: Not on file  . Number of children: 1  . Years of education: H/S  . Highest education level: Some college, no degree  Occupational History  . Occupation: Retired  Tobacco Use  . Smoking status: Never Smoker  . Smokeless tobacco: Never Used  Vaping Use  . Vaping Use: Never used  Substance and Sexual Activity  . Alcohol use: No  . Drug use: No  . Sexual activity: Not Currently  Other Topics Concern  . Not on file  Social History Narrative  . Not on file   Social Determinants of Health   Financial Resource Strain: Low Risk   . Difficulty of Paying Living Expenses: Not hard at all  Food Insecurity: No Food Insecurity  . Worried About Charity fundraiser in the Last Year: Never true  . Ran Out of Food in the  Last Year: Never true  Transportation Needs: No Transportation Needs  . Lack of Transportation (Medical): No  . Lack of Transportation (Non-Medical): No  Physical Activity: Insufficiently Active  . Days of Exercise per Week: 2 days  . Minutes of Exercise per Session: 40 min  Stress: No Stress Concern Present  . Feeling of Stress : Only a little  Social Connections: Moderately Integrated  . Frequency of Communication with Friends and Family: More than three times a week  . Frequency of Social Gatherings with Friends and Family: More than three times a week  . Attends Religious Services: More than 4 times per year  . Active Member of Clubs or Organizations: Yes  . Attends Archivist Meetings: More than 4 times per year  . Marital Status: Widowed    Tobacco Counseling Counseling given: Not Answered   Clinical Intake:  Pre-visit preparation completed:  Yes  Pain : No/denies pain     Nutritional Risks: None Diabetes: No  How often do you need to have someone help you when you read instructions, pamphlets, or other written materials from your doctor or pharmacy?: 1 - Never  Diabetic? No  Interpreter Needed?: No  Information entered by :: [facility], LPN   Activities of Daily Living In your present state of health, do you have any difficulty performing the following activities: 09/24/2020  Hearing? N  Vision? N  Difficulty concentrating or making decisions? N  Walking or climbing stairs? N  Dressing or bathing? N  Doing errands, shopping? N  Preparing Food and eating ? N  Using the Toilet? N  In the past six months, have you accidently leaked urine? Y  Comment Wears protection daily.  Do you have problems with loss of bowel control? N  Managing your Medications? N  Managing your Finances? N  Housekeeping or managing your Housekeeping? N  Some recent data might be hidden    Patient Care Team: [name]., MD as PCP - General (Family  Medicine) Lorelee Cover., MD as Consulting Physician (Ophthalmology) Oneta Rack, MD as Consulting Physician (Dermatology) Bary Castilla Forest Gleason, MD as Consulting Physician (General Surgery)  Indicate any recent Medical Services you may have received from other than [facility] providers in the past year (date may be approximate).     Assessment:   This is a routine wellness examination for [place].  Hearing/Vision screen No exam data present  Dietary issues and exercise activities discussed: Current Exercise Habits: Structured exercise class, Type of exercise: strength training/weights;stretching, Time (Minutes): 45, Frequency (Times/Week): 2, Weekly Exercise (Minutes/Week): 90, Exercise limited by: None identified  Goals    . Exercise 150 minutes per week (moderate activity)    . Increase water intake     Recommend increasing water intake to 4-6 glasses a day.       Depression Screen PHQ 2/9 Scores 09/24/2020 09/23/2019 09/19/2018 09/19/2018 07/06/2017 10/05/2016 09/23/2015  PHQ - 2 Score 0 0 0 0 0 0 0  PHQ- 9 Score - - 0 - - - -    Fall Risk Fall Risk  09/24/2020 09/23/2019 02/06/2019 09/19/2018 07/06/2017  Falls in the past year? 0 0 0 No No  Number falls in past yr: 0 0 0 - -  Injury with Fall? 0 0 0 - -    Any stairs in or around the home? Yes  If so, are there any without handrails? No  Home free of loose throw rugs in walkways, pet beds, electrical cords, etc? Yes  Adequate lighting in your home to reduce risk of falls? Yes   ASSISTIVE DEVICES UTILIZED TO PREVENT FALLS:  Life alert? Yes  Use of a cane, walker or w/c? No  Grab bars in the bathroom? Yes  Shower chair or bench in shower? No  Elevated toilet seat or a handicapped toilet? No    Cognitive Function:     6CIT Screen 09/24/2020 09/23/2019 07/06/2017 10/05/2016  What Year? 0 points 0 points 0 points 0 points  What month? 0 points 0 points 0 points 0 points  What time? 0 points 0 points 0 points 0 points  Count  back from 20 0 points 0 points 0 points 0 points  Months in reverse 0 points 0 points 0 points 0 points  Repeat phrase 2 points 0 points 0 points 0 points  Total Score 2 0 0 0    Immunizations Immunization  History  Administered Date(s) Administered  . Fluad Quad(high Dose 65+) 09/24/2019  . Influenza, High Dose Seasonal PF 09/23/2015, 10/05/2016, 11/01/2017, 09/19/2018  . Moderna SARS-COVID-2 Vaccination 12/31/2019, 01/28/2020  . Pneumococcal Conjugate-13 08/01/2014  . Pneumococcal Polysaccharide-23 06/03/1999  . Td 01/18/2007    TDAP status: Due, Education has been provided regarding the importance of this vaccine. Advised may receive this vaccine at local pharmacy or Health Dept. Aware to provide a copy of the vaccination record if obtained from local pharmacy or Health Dept. Verbalized acceptance and understanding. Flu Vaccine status: Declined, Education has been provided regarding the importance of this vaccine but patient still declined. Advised may receive this vaccine at local pharmacy or Health Dept. Aware to provide a copy of the vaccination record if obtained from local pharmacy or Health Dept. Verbalized acceptance and understanding. Pneumococcal vaccine status: Up to date Covid-19 vaccine status: Completed vaccines  Qualifies for Shingles Vaccine? Yes   Zostavax completed No   Shingrix Completed?: No.    Education has been provided regarding the importance of this vaccine. Patient has been advised to call insurance company to determine out of pocket expense if they have not yet received this vaccine. Advised may also receive vaccine at local pharmacy or Health Dept. Verbalized acceptance and understanding.  Screening Tests Health Maintenance  Topic Date Due  . INFLUENZA VACCINE  07/19/2020  . TETANUS/TDAP  12/19/2026 (Originally 01/18/2017)  . DEXA SCAN  Completed  . COVID-19 Vaccine  Completed  . PNA vac Low Risk Adult  Completed    Health Maintenance  Health  Maintenance Due  Topic Date Due  . INFLUENZA VACCINE  07/19/2020    Colorectal cancer screening: No longer required.  Mammogram status: No longer required.  Bone Density status: Completed 10/06/10. Results reflect: Previous DEXA scan was normal. No repeat needed unless advised by a physician.  Lung Cancer Screening: (Low Dose CT Chest recommended if Age 38-80 years, 30 pack-year currently smoking OR have quit w/in 15years.) does not qualify.   Additional Screening:  Vision Screening: Recommended annual ophthalmology exams for early detection of glaucoma and other disorders of the eye. Is the patient up to date with their annual eye exam?  Yes  Who is the provider or what is the name of the office in which the patient attends annual eye exams? Dr Alvester Morin  If pt is not established with a provider, would they like to be referred to a provider to establish care? No .   Dental Screening: Recommended annual dental exams for proper oral hygiene  Community Resource Referral / Chronic Care Management: CRR required this visit?  No   CCM required this visit?  No      Plan:     I have personally reviewed and noted the following in the patient's chart:   . Medical and social history . Use of alcohol, tobacco or illicit drugs  . Current medications and supplements . Functional ability and status . Nutritional status . Physical activity . Advanced directives . List of other physicians . Hospitalizations, surgeries, and ER visits in previous 12 months . Vitals . Screenings to include cognitive, depression, and falls . Referrals and appointments  In addition, I have reviewed and discussed with patient certain preventive protocols, quality metrics, and best practice recommendations. A written personalized care plan for preventive services as well as general preventive health recommendations were provided to patient.     Donavon Kimrey [place], [place]   [phone]   Nurse Notes: Pt to receive the  flu shot at next in office apt.

## 2020-09-24 ENCOUNTER — Encounter: Payer: PPO | Admitting: Family Medicine

## 2020-09-24 ENCOUNTER — Other Ambulatory Visit: Payer: Self-pay

## 2020-09-24 ENCOUNTER — Ambulatory Visit (INDEPENDENT_AMBULATORY_CARE_PROVIDER_SITE_OTHER): Payer: PPO

## 2020-09-24 DIAGNOSIS — Z0000 Encounter for general adult medical examination without abnormal findings: Secondary | ICD-10-CM

## 2020-09-24 NOTE — Patient Instructions (Signed)
Kelly Clark , Thank you for taking time to come for your Medicare Wellness Visit. I appreciate your ongoing commitment to your health goals. Please review the following plan we discussed and let me know if I can assist you in the future.   Screening recommendations/referrals: Colonoscopy: No longer required.  Mammogram: No longer required.  Bone Density: Previous DEXA scan was normal. No repeat needed unless advised by a physician. Recommended yearly ophthalmology/optometry visit for glaucoma screening and checkup Recommended yearly dental visit for hygiene and checkup  Vaccinations: Influenza vaccine: Currently due. Will receive at next in office apt. Pneumococcal vaccine: Completed series Tdap vaccine: Currently due, declined at this time.  Shingles vaccine: Shingrix discussed. Please contact your pharmacy for coverage information.     Advanced directives: Currently on file.  Conditions/risks identified: Recommend to increase water intake to 6-8 8 oz glasses a day.  Next appointment: 09/28/20 @ 3:00 PM with Dr Rosanna Randy    Preventive Care 23 Years and Older, Female Preventive care refers to lifestyle choices and visits with your health care provider that can promote health and wellness. What does preventive care include?  A yearly physical exam. This is also called an annual well check.  Dental exams once or twice a year.  Routine eye exams. Ask your health care provider how often you should have your eyes checked.  Personal lifestyle choices, including:  Daily care of your teeth and gums.  Regular physical activity.  Eating a healthy diet.  Avoiding tobacco and drug use.  Limiting alcohol use.  Practicing safe sex.  Taking low-dose aspirin every day.  Taking vitamin and mineral supplements as recommended by your health care provider. What happens during an annual well check? The services and screenings done by your health care provider during your annual well check  will depend on your age, overall health, lifestyle risk factors, and family history of disease. Counseling  Your health care provider may ask you questions about your:  Alcohol use.  Tobacco use.  Drug use.  Emotional well-being.  Home and relationship well-being.  Sexual activity.  Eating habits.  History of falls.  Memory and ability to understand (cognition).  Work and work Statistician.  Reproductive health. Screening  You may have the following tests or measurements:  Height, weight, and BMI.  Blood pressure.  Lipid and cholesterol levels. These may be checked every 5 years, or more frequently if you are over 48 years old.  Skin check.  Lung cancer screening. You may have this screening every year starting at age 55 if you have a 30-pack-year history of smoking and currently smoke or have quit within the past 15 years.  Fecal occult blood test (FOBT) of the stool. You may have this test every year starting at age 66.  Flexible sigmoidoscopy or colonoscopy. You may have a sigmoidoscopy every 5 years or a colonoscopy every 10 years starting at age 26.  Hepatitis C blood test.  Hepatitis B blood test.  Sexually transmitted disease (STD) testing.  Diabetes screening. This is done by checking your blood sugar (glucose) after you have not eaten for a while (fasting). You may have this done every 1-3 years.  Bone density scan. This is done to screen for osteoporosis. You may have this done starting at age 67.  Mammogram. This may be done every 1-2 years. Talk to your health care provider about how often you should have regular mammograms. Talk with your health care provider about your test results, treatment options, and  if necessary, the need for more tests. Vaccines  Your health care provider may recommend certain vaccines, such as:  Influenza vaccine. This is recommended every year.  Tetanus, diphtheria, and acellular pertussis (Tdap, Td) vaccine. You may  need a Td booster every 10 years.  Zoster vaccine. You may need this after age 40.  Pneumococcal 13-valent conjugate (PCV13) vaccine. One dose is recommended after age 70.  Pneumococcal polysaccharide (PPSV23) vaccine. One dose is recommended after age 46. Talk to your health care provider about which screenings and vaccines you need and how often you need them. This information is not intended to replace advice given to you by your health care provider. Make sure you discuss any questions you have with your health care provider. Document Released: 01/01/2016 Document Revised: 08/24/2016 Document Reviewed: 10/06/2015 Elsevier Interactive Patient Education  2017 [place] Prevention in the Home Falls can cause injuries. They can happen to people of all ages. There are many things you can do to make your home safe and to help prevent falls. What can I do on the outside of my home?  Regularly fix the edges of walkways and driveways and fix any cracks.  Remove anything that might make you trip as you walk through a door, such as a raised step or threshold.  Trim any bushes or trees on the path to your home.  Use bright outdoor lighting.  Clear any walking paths of anything that might make someone trip, such as rocks or tools.  Regularly check to see if handrails are loose or broken. Make sure that both sides of any steps have handrails.  Any raised decks and porches should have guardrails on the edges.  Have any leaves, snow, or ice cleared regularly.  Use sand or salt on walking paths during winter.  Clean up any spills in your garage right away. This includes oil or grease spills. What can I do in the bathroom?  Use night lights.  Install grab bars by the toilet and in the tub and shower. Do not use towel bars as grab bars.  Use non-skid mats or decals in the tub or shower.  If you need to sit down in the shower, use a plastic, non-slip stool.  Keep the floor  dry. Clean up any water that spills on the floor as soon as it happens.  Remove soap buildup in the tub or shower regularly.  Attach bath mats securely with double-sided non-slip rug tape.  Do not have throw rugs and other things on the floor that can make you trip. What can I do in the bedroom?  Use night lights.  Make sure that you have a light by your bed that is easy to reach.  Do not use any sheets or blankets that are too big for your bed. They should not hang down onto the floor.  Have a firm chair that has side arms. You can use this for support while you get dressed.  Do not have throw rugs and other things on the floor that can make you trip. What can I do in the kitchen?  Clean up any spills right away.  Avoid walking on wet floors.  Keep items that you use a lot in easy-to-reach places.  If you need to reach something above you, use a strong step stool that has a grab bar.  Keep electrical cords out of the way.  Do not use floor polish or wax that makes floors slippery. If you  must use wax, use non-skid floor wax.  Do not have throw rugs and other things on the floor that can make you trip. What can I do with my stairs?  Do not leave any items on the stairs.  Make sure that there are handrails on both sides of the stairs and use them. Fix handrails that are broken or loose. Make sure that handrails are as long as the stairways.  Check any carpeting to make sure that it is firmly attached to the stairs. Fix any carpet that is loose or worn.  Avoid having throw rugs at the top or bottom of the stairs. If you do have throw rugs, attach them to the floor with carpet tape.  Make sure that you have a light switch at the top of the stairs and the bottom of the stairs. If you do not have them, ask someone to add them for you. What else can I do to help prevent falls?  Wear shoes that:  Do not have high heels.  Have rubber bottoms.  Are comfortable and fit you  well.  Are closed at the toe. Do not wear sandals.  If you use a stepladder:  Make sure that it is fully opened. Do not climb a closed stepladder.  Make sure that both sides of the stepladder are locked into place.  Ask someone to hold it for you, if possible.  Clearly mark and make sure that you can see:  Any grab bars or handrails.  First and last steps.  Where the edge of each step is.  Use tools that help you move around (mobility aids) if they are needed. These include:  Canes.  Walkers.  Scooters.  Crutches.  Turn on the lights when you go into a dark area. Replace any light bulbs as soon as they burn out.  Set up your furniture so you have a clear path. Avoid moving your furniture around.  If any of your floors are uneven, fix them.  If there are any pets around you, be aware of where they are.  Review your medicines with your doctor. Some medicines can make you feel dizzy. This can increase your chance of falling. Ask your doctor what other things that you can do to help prevent falls. This information is not intended to replace advice given to you by your health care provider. Make sure you discuss any questions you have with your health care provider. Document Released: 10/01/2009 Document Revised: 05/12/2016 Document Reviewed: 01/09/2015 Elsevier Interactive Patient Education  2017 [employer].

## 2020-09-25 NOTE — Progress Notes (Signed)
I,Kelly Clark,acting as a scribe for Wilhemena Durie, MD.,have documented all relevant documentation on the behalf of Wilhemena Durie, MD,as directed by  Wilhemena Durie, MD while in the presence of Wilhemena Durie, MD.   Complete physical exam   Patient: Kelly Clark   DOB: 01-21-1929   86 y.o. Female  MRN: [id number] Visit Date: 09/28/2020  Today's healthcare provider: Wilhemena Durie, MD   Chief Complaint  Patient presents with  . Annual Exam   Subjective    Kelly Clark is a 86 y.o. female who presents today for a complete physical exam.  She reports consuming a general diet. Home exercise routine includes [employer] snickers. She generally feels fairly well. She reports sleeping fairly well. She does not have additional problems to discuss today.  HPI  Patient had AWV with [facility] on 09/24/2020.  Past Medical History:  Diagnosis Date  . Basal cell carcinoma 2019   RIGHT THUMB REGION  . Breast cancer ([place]) 06/27/2018   RIGHT  . Cancer ([place]) 1977   melanoma . SURGERY RIGHT ARM  . Hemorrhoids   . Hypertension   . Hypertriglyceridemia   . Insomnia   . Menopausal disorder   . Neutropenia ([place])   . Paresthesia   . Peripheral neuropathy   . Tinea corporis   . Vitamin D deficiency    Past Surgical History:  Procedure Laterality Date  . ABDOMINAL HYSTERECTOMY  1977  . BREAST BIOPSY Bilateral 1977   benign  . BREAST BIOPSY Right 06/27/2018   INVASIVE MAMMARY CARCINOMA, NO SPECIAL TYPE. 2 oclock ER/PR negative HER2 positive  . BREAST EXCISIONAL BIOPSY Right 07/23/2018   lumpectomy  . BREAST LUMPECTOMY WITH SENTINEL LYMPH NODE BIOPSY Right 07/23/2018   6 mm ER/PR negative, Her 2 neu 3+; node negative.  Surgeon: Robert Bellow, MD;  Location: [facility] ORS;  Service: General;  Laterality: Right;  . COLONOSCOPY  2007  . ELECTROCARDIOGRAM    . EYE SURGERY Bilateral    CATARACT EXTRACTIONS   Social History   Socioeconomic History  . Marital status:  Widowed    Spouse name: Not on file  . Number of children: 1  . Years of education: H/S  . Highest education level: Some college, no degree  Occupational History  . Occupation: Retired  Tobacco Use  . Smoking status: Never Smoker  . Smokeless tobacco: Never Used  Vaping Use  . Vaping Use: Never used  Substance and Sexual Activity  . Alcohol use: No  . Drug use: No  . Sexual activity: Not Currently  Other Topics Concern  . Not on file  Social History Narrative  . Not on file   Social Determinants of Health   Financial Resource Strain: Low Risk   . Difficulty of Paying Living Expenses: Not hard at all  Food Insecurity: No Food Insecurity  . Worried About Charity fundraiser in the Last Year: Never true  . Ran Out of Food in the Last Year: Never true  Transportation Needs: No Transportation Needs  . Lack of Transportation (Medical): No  . Lack of Transportation (Non-Medical): No  Physical Activity: Insufficiently Active  . Days of Exercise per Week: 2 days  . Minutes of Exercise per Session: 40 min  Stress: No Stress Concern Present  . Feeling of Stress : Only a little  Social Connections: Moderately Integrated  . Frequency of Communication with Friends and Family: More than three times a week  . Frequency  of Social Gatherings with Friends and Family: More than three times a week  . Attends Religious Services: More than 4 times per year  . Active Member of Clubs or Organizations: Yes  . Attends Archivist Meetings: More than 4 times per year  . Marital Status: Widowed  Intimate Partner Violence: Not At Risk  . Fear of Current or Ex-Partner: No  . Emotionally Abused: No  . Physically Abused: No  . Sexually Abused: No   Family Status  Relation Name Status  . Mother  Deceased at age 94  . Father  Deceased at age 47  . Sister  Deceased  . MGM  Deceased at age 64  . Daughter  Alive  . Neg Hx  (Not Specified)   Family History  Problem Relation Age of Onset   . Heart attack Mother   . Congestive Heart Failure Mother   . Stroke Father   . Colon cancer Sister 57  . Throat cancer Maternal Grandmother 5  . Healthy Daughter   . Breast cancer Neg Hx    No Known Allergies  Patient Care Team: [name]., MD as PCP - General (Family Medicine) Lorelee Cover., MD as Consulting Physician (Ophthalmology) Oneta Rack, MD as Consulting Physician (Dermatology) Bary Castilla Forest Gleason, MD as Consulting Physician (General Surgery)   Medications: Outpatient Medications Prior to Visit  Medication Sig  . Cholecalciferol (VITAMIN D3) 10000 units TABS Take 10,000 Units by mouth at bedtime.  . clobetasol ointment (TEMOVATE) 3.33 % Apply 1 application topically once a week. Applied to affected area of vagina and rectum   . gabapentin (NEURONTIN) 600 MG tablet TAKE 1 TABLET BY MOUTH AT BEDTIME  . Ginkgo Biloba 40 MG TABS Take 1 tablet by mouth daily. @@ noon  . hydrochlorothiazide (HYDRODIURIL) 12.5 MG tablet TAKE 1 TABLET BY MOUTH ONCE DAILY  . latanoprost (XALATAN) 0.005 % ophthalmic solution Place 1 drop into both eyes at bedtime.   . Multiple Vitamin (MULTIVITAMIN) tablet Take 1 tablet by mouth daily.  . multivitamin-lutein (OCUVITE-LUTEIN) CAPS capsule Take 1 capsule by mouth every morning.   . polyethylene glycol powder (MIRALAX) powder Take 17 g by mouth at bedtime.   Marland Kitchen RESVERATROL-GRAPE PO Take 1 capsule by mouth daily. CoQ10 Resveratrol & Grape Seed  . [DISCONTINUED] amLODipine (NORVASC) 10 MG tablet TAKE 1 TABLET(10 MG) BY MOUTH DAILY  . colchicine 0.6 MG tablet Take 1 tablet (0.6 mg total) by mouth daily. Take 2 tablets (1.2 mg total) by mouth stat.  Then 1 tablet by mouth daily (Patient not taking: Reported on 09/15/2020)  . meclizine (ANTIVERT) 25 MG tablet Take 25 mg by mouth 3 (three) times daily as needed. (Patient not taking: Reported on 09/15/2020)  . Omega-3 Fatty Acids (FISH OIL) 1000 MG CAPS Take by mouth. (Patient not taking:  Reported on 09/15/2020)  . sucralfate (CARAFATE) 1 g tablet Take 1 tablet (1 g total) by mouth 4 (four) times daily -  with meals and at bedtime.   No facility-administered medications prior to visit.    Review of Systems  All other systems reviewed and are negative.      Objective    BP (!) 163/81 (BP Location: Left Arm, Patient Position: Sitting, Cuff Size: Large)   Pulse 82   Temp 98.2 F (36.8 C) (Oral)   Resp 18   Ht $R'5\' 2"'IZ$  (1.575 m)   Wt 180 lb (81.6 kg)   SpO2 98%   BMI 32.92 kg/m  Physical Exam Vitals reviewed.  Constitutional:      Appearance: Normal appearance.  HENT:     Right Ear: Tympanic membrane and external ear normal.     Left Ear: Tympanic membrane and external ear normal.     Mouth/Throat:     Pharynx: Oropharynx is clear.  Eyes:     General: No scleral icterus.    Conjunctiva/sclera: Conjunctivae normal.  Cardiovascular:     Rate and Rhythm: Normal rate and regular rhythm.     Heart sounds: Normal heart sounds.  Pulmonary:     Effort: Pulmonary effort is normal.     Breath sounds: Normal breath sounds.  Abdominal:     General: Bowel sounds are normal.     Palpations: Abdomen is soft.  Musculoskeletal:     Comments: Trace edema.  Skin:    General: Skin is warm and dry.  Neurological:     General: No focal deficit present.     Mental Status: She is alert and oriented to person, place, and time.  Psychiatric:        Mood and Affect: Mood normal.        Behavior: Behavior normal.        Thought Content: Thought content normal.        Judgment: Judgment normal.       Last depression screening scores PHQ 2/9 Scores 09/24/2020 09/23/2019 09/19/2018  PHQ - 2 Score 0 0 0  PHQ- 9 Score - - 0   Last fall risk screening Fall Risk  09/24/2020  Falls in the past year? 0  Number falls in past yr: 0  Injury with Fall? 0   Last Audit-C alcohol use screening Alcohol Use Disorder Test (AUDIT) 09/24/2020  1. How often do you have a drink  containing alcohol? 0  2. How many drinks containing alcohol do you have on a typical day when you are drinking? 0  3. How often do you have six or more drinks on one occasion? 0  AUDIT-C Score 0  Alcohol Brief Interventions/Follow-up AUDIT Score <7 follow-up not indicated   A score of 3 or more in women, and 4 or more in men indicates increased risk for alcohol abuse, EXCEPT if all of the points are from question 1   No results found for any visits on 09/28/20.  Assessment & Plan    Routine Health Maintenance and Physical Exam  Exercise Activities and Dietary recommendations Goals    . Exercise 150 minutes per week (moderate activity)    . Increase water intake     Recommend increasing water intake to 4-6 glasses a day.        Immunization History  Administered Date(s) Administered  . Fluad Quad(high Dose 65+) 09/24/2019, 09/28/2020  . Influenza, High Dose Seasonal PF 09/23/2015, 10/05/2016, 11/01/2017, 09/19/2018  . Moderna SARS-COVID-2 Vaccination 12/31/2019, 01/28/2020  . Pneumococcal Conjugate-13 08/01/2014  . Pneumococcal Polysaccharide-23 06/03/1999  . Td 01/18/2007    Health Maintenance  Topic Date Due  . TETANUS/TDAP  12/19/2026 (Originally 01/18/2017)  . INFLUENZA VACCINE  Completed  . DEXA SCAN  Completed  . COVID-19 Vaccine  Completed  . PNA vac Low Risk Adult  Completed    Discussed health benefits of physical activity, and encouraged her to engage in regular exercise appropriate for her age and condition.  1. Encounter for annual physical exam Patient declines any further screening testing.  Appropriate at age 73.   2. Need for influenza vaccination  - Flu Vaccine QUAD  High Dose(Fluad)  3. Benign essential HTN Amlodipine is decreased from 10 mg to 5 mg and dizziness is better.  Blood pressure at home is in the 1 20-1 40 over 70s to 80 region - CBC with Differential - Comprehensive Metabolic Panel (CMET)  4. Hypertriglyceridemia   5. Gout,  unspecified cause, unspecified chronicity, unspecified site  - Uric acid   No follow-ups on file.     I, Wilhemena Durie, MD, have reviewed all documentation for this visit. The documentation on 10/03/20 for the exam, diagnosis, procedures, and orders are all accurate and complete.    Azura Tufaro Cranford Mon, MD  [facility] [phone] (phone) [phone] (fax)  [place]

## 2020-09-28 ENCOUNTER — Other Ambulatory Visit: Payer: Self-pay

## 2020-09-28 ENCOUNTER — Ambulatory Visit (INDEPENDENT_AMBULATORY_CARE_PROVIDER_SITE_OTHER): Payer: PPO | Admitting: Family Medicine

## 2020-09-28 ENCOUNTER — Other Ambulatory Visit: Payer: Self-pay | Admitting: Family Medicine

## 2020-09-28 ENCOUNTER — Encounter: Payer: Self-pay | Admitting: Family Medicine

## 2020-09-28 VITALS — BP 163/81 | HR 82 | Temp 98.2°F | Resp 18 | Ht 62.0 in | Wt 180.0 lb

## 2020-09-28 DIAGNOSIS — M109 Gout, unspecified: Secondary | ICD-10-CM | POA: Diagnosis not present

## 2020-09-28 DIAGNOSIS — Z0000 Encounter for general adult medical examination without abnormal findings: Secondary | ICD-10-CM

## 2020-09-28 DIAGNOSIS — E781 Pure hyperglyceridemia: Secondary | ICD-10-CM

## 2020-09-28 DIAGNOSIS — Z23 Encounter for immunization: Secondary | ICD-10-CM | POA: Diagnosis not present

## 2020-09-28 DIAGNOSIS — I10 Essential (primary) hypertension: Secondary | ICD-10-CM | POA: Diagnosis not present

## 2020-09-28 MED ORDER — AMLODIPINE BESYLATE 5 MG PO TABS
5.0000 mg | ORAL_TABLET | Freq: Every day | ORAL | 1 refills | Status: DC
Start: 2020-09-28 — End: 2021-03-08

## 2020-09-28 NOTE — Telephone Encounter (Signed)
Requested Prescriptions  Pending Prescriptions Disp Refills  . hydrochlorothiazide (HYDRODIURIL) 12.5 MG tablet [Pharmacy Med Name: HYDROCHLOROTHIAZIDE 12.5MG  TABLETS] 90 tablet 3    Sig: TAKE 1 TABLET BY MOUTH ONCE DAILY     Cardiovascular: Diuretics - Thiazide Failed - 09/28/2020  3:16 AM      Failed - Ca in normal range and within 360 days    Calcium  Date Value Ref Range Status  09/24/2019 9.6 8.7 - 10.3 mg/dL Final         Failed - Cr in normal range and within 360 days    Creat  Date Value Ref Range Status  10/10/2017 0.94 (H) 0.60 - 0.88 mg/dL Final    Comment:    For patients >25 years of age, the reference limit for Creatinine is approximately 13% higher for people identified as African-American. .    Creatinine, Ser  Date Value Ref Range Status  09/24/2019 0.99 0.57 - 1.00 mg/dL Final         Failed - K in normal range and within 360 days    Potassium  Date Value Ref Range Status  09/24/2019 3.7 3.5 - 5.2 mmol/L Final         Failed - Na in normal range and within 360 days    Sodium  Date Value Ref Range Status  09/24/2019 142 134 - 144 mmol/L Final         Passed - Last BP in normal range    BP Readings from Last 1 Encounters:  09/15/20 133/80         Passed - Valid encounter within last 6 months    Recent Outpatient Visits          1 week ago Chest pressure   [place], [place], [place]   3 months ago Disorder of peripheral nervous system   [facility] [name]., MD   4 months ago Leg swelling   [facility] [name]., MD   11 months ago Vaginal irritation   [place] [name]., MD   1 year ago Encounter for annual physical exam   [facility] [name]., MD      Future Appointments            Today [name]., MD [facility], [place], [place]

## 2020-09-29 DIAGNOSIS — M109 Gout, unspecified: Secondary | ICD-10-CM | POA: Diagnosis not present

## 2020-09-29 DIAGNOSIS — I10 Essential (primary) hypertension: Secondary | ICD-10-CM | POA: Diagnosis not present

## 2020-09-30 LAB — COMPREHENSIVE METABOLIC PANEL
ALT: 13 IU/L (ref 0–32)
AST: 21 IU/L (ref 0–40)
Albumin/Globulin Ratio: 1.7 (ref 1.2–2.2)
Albumin: 4 g/dL (ref 3.6–4.6)
Alkaline Phosphatase: 47 IU/L (ref 44–121)
BUN/Creatinine Ratio: 14 (ref 12–28)
BUN: 14 mg/dL (ref 8–27)
Bilirubin Total: 0.4 mg/dL (ref 0.0–1.2)
CO2: 27 mmol/L (ref 20–29)
Calcium: 10 mg/dL (ref 8.7–10.3)
Chloride: 102 mmol/L (ref 96–106)
Creatinine, Ser: 0.98 mg/dL (ref 0.57–1.00)
GFR calc Af Amer: 59 mL/min/{1.73_m2} — ABNORMAL LOW (ref >59)
GFR calc non Af Amer: 51 mL/min/{1.73_m2} — ABNORMAL LOW (ref >59)
Globulin, Total: 2.3 g/dL (ref 1.5–4.5)
Glucose: 94 mg/dL (ref 65–99)
Potassium: 3.6 mmol/L (ref 3.5–5.2)
Sodium: 144 mmol/L (ref 134–144)
Total Protein: 6.3 g/dL (ref 6.0–8.5)

## 2020-09-30 LAB — CBC WITH DIFFERENTIAL/PLATELET
Basophils Absolute: 0 10*3/uL (ref 0.0–0.2)
Basos: 0 %
EOS (ABSOLUTE): 0.3 10*3/uL (ref 0.0–0.4)
Eos: 6 %
Hematocrit: 41.2 % (ref 34.0–46.6)
Hemoglobin: 13.4 g/dL (ref 11.1–15.9)
Immature Grans (Abs): 0 10*3/uL (ref 0.0–0.1)
Immature Granulocytes: 0 %
Lymphocytes Absolute: 0.9 10*3/uL (ref 0.7–3.1)
Lymphs: 18 %
MCH: 30.5 pg (ref 26.6–33.0)
MCHC: 32.5 g/dL (ref 31.5–35.7)
MCV: 94 fL (ref 79–97)
Monocytes Absolute: 0.5 10*3/uL (ref 0.1–0.9)
Monocytes: 10 %
Neutrophils Absolute: 3.4 10*3/uL (ref 1.4–7.0)
Neutrophils: 66 %
Platelets: 224 10*3/uL (ref 150–450)
RBC: 4.4 x10E6/uL (ref 3.77–5.28)
RDW: 14.1 % (ref 11.7–15.4)
WBC: 5.2 10*3/uL (ref 3.4–10.8)

## 2020-09-30 LAB — URIC ACID: Uric Acid: 8.8 mg/dL — ABNORMAL HIGH (ref 3.1–7.9)

## 2020-10-04 ENCOUNTER — Other Ambulatory Visit: Payer: Self-pay | Admitting: Family Medicine

## 2020-10-12 ENCOUNTER — Other Ambulatory Visit: Payer: Self-pay | Admitting: Physician Assistant

## 2020-10-12 DIAGNOSIS — R140 Abdominal distension (gaseous): Secondary | ICD-10-CM

## 2020-10-12 DIAGNOSIS — K30 Functional dyspepsia: Secondary | ICD-10-CM

## 2020-10-13 DIAGNOSIS — H40153 Residual stage of open-angle glaucoma, bilateral: Secondary | ICD-10-CM | POA: Diagnosis not present

## 2020-12-24 ENCOUNTER — Other Ambulatory Visit: Payer: Self-pay | Admitting: General Surgery

## 2020-12-24 DIAGNOSIS — D2262 Melanocytic nevi of left upper limb, including shoulder: Secondary | ICD-10-CM | POA: Diagnosis not present

## 2020-12-24 DIAGNOSIS — C50911 Malignant neoplasm of unspecified site of right female breast: Secondary | ICD-10-CM

## 2020-12-24 DIAGNOSIS — L02426 Furuncle of left lower limb: Secondary | ICD-10-CM | POA: Diagnosis not present

## 2020-12-24 DIAGNOSIS — Z85828 Personal history of other malignant neoplasm of skin: Secondary | ICD-10-CM | POA: Diagnosis not present

## 2020-12-24 DIAGNOSIS — I788 Other diseases of capillaries: Secondary | ICD-10-CM | POA: Diagnosis not present

## 2020-12-24 DIAGNOSIS — L900 Lichen sclerosus et atrophicus: Secondary | ICD-10-CM | POA: Diagnosis not present

## 2020-12-24 DIAGNOSIS — L821 Other seborrheic keratosis: Secondary | ICD-10-CM | POA: Diagnosis not present

## 2020-12-24 DIAGNOSIS — Z85820 Personal history of malignant melanoma of skin: Secondary | ICD-10-CM | POA: Diagnosis not present

## 2020-12-24 NOTE — Progress Notes (Signed)
[id number]  

## 2021-01-06 ENCOUNTER — Other Ambulatory Visit: Payer: Self-pay | Admitting: General Surgery

## 2021-01-06 DIAGNOSIS — C50211 Malignant neoplasm of upper-inner quadrant of right female breast: Secondary | ICD-10-CM

## 2021-01-06 DIAGNOSIS — Z171 Estrogen receptor negative status [ER-]: Secondary | ICD-10-CM

## 2021-01-06 NOTE — Addendum Note (Signed)
Addended byHervey Ard on: 01/06/2021 03:16 PM   Modules accepted: Orders

## 2021-01-26 ENCOUNTER — Other Ambulatory Visit: Payer: Self-pay

## 2021-01-26 ENCOUNTER — Ambulatory Visit
Admission: RE | Admit: 2021-01-26 | Discharge: 2021-01-26 | Disposition: A | Discharge status: Home / Self Care | Payer: PPO | Source: Ambulatory Visit | Attending: General Surgery | Admitting: General Surgery

## 2021-01-26 DIAGNOSIS — C50211 Malignant neoplasm of upper-inner quadrant of right female breast: Secondary | ICD-10-CM

## 2021-01-26 DIAGNOSIS — Z853 Personal history of malignant neoplasm of breast: Secondary | ICD-10-CM | POA: Diagnosis not present

## 2021-01-26 DIAGNOSIS — Z1231 Encounter for screening mammogram for malignant neoplasm of breast: Secondary | ICD-10-CM | POA: Insufficient documentation

## 2021-02-02 DIAGNOSIS — Z853 Personal history of malignant neoplasm of breast: Secondary | ICD-10-CM | POA: Diagnosis not present

## 2021-02-08 DIAGNOSIS — H40153 Residual stage of open-angle glaucoma, bilateral: Secondary | ICD-10-CM | POA: Diagnosis not present

## 2021-02-15 DIAGNOSIS — I10 Essential (primary) hypertension: Secondary | ICD-10-CM | POA: Diagnosis not present

## 2021-02-15 DIAGNOSIS — H26492 Other secondary cataract, left eye: Secondary | ICD-10-CM | POA: Diagnosis not present

## 2021-03-08 ENCOUNTER — Other Ambulatory Visit: Payer: Self-pay | Admitting: Family Medicine

## 2021-03-26 NOTE — Progress Notes (Signed)
I,April Miller,acting as a scribe for Wilhemena Durie, MD.,have documented all relevant documentation on the behalf of Wilhemena Durie, MD,as directed by  Wilhemena Durie, MD while in the presence of Wilhemena Durie, MD.   Established patient visit   Patient: Kelly Clark   DOB: 1929/09/30   87 y.o. Female  MRN: [id number] Visit Date: 03/29/2021  Today's healthcare provider: Wilhemena Durie, MD   Chief Complaint  Patient presents with  . Follow-up  . Hypertension   Subjective    HPI  Patient comes in today for follow-up of hypertension but has had onset 2 weeks ago unsteady gait where she feels like she stumbles.  Sometimes to the right sometimes of the left and sometimes backwards.  She occasionally feels dizziness.  She does have decreased sensation in her feet and legs.  He has not fallen. No known trauma.  No other neurologic symptoms.  No palpitations syncope or presyncope. Hypertension, follow-up  BP Readings from Last 3 Encounters:  03/29/21 132/74  09/28/20 (!) 163/81  09/15/20 133/80   Wt Readings from Last 3 Encounters:  03/29/21 182 lb (82.6 kg)  09/28/20 180 lb (81.6 kg)  09/15/20 179 lb 12.8 oz (81.6 kg)     She was last seen for hypertension 6 months ago.  BP at that visit was 163/81. Management since that visit includes; Continue amlodipine 5 mg. She reports good compliance with treatment. She is not having side effects. none She is exercising, at gym. She is adherent to low salt diet.   Outside blood pressures are 130/79.  She does not smoke.  Use of agents associated with hypertension: none.   --------------------------------------------------------------------       Medications: Outpatient Medications Prior to Visit  Medication Sig  . amLODipine (NORVASC) 5 MG tablet TAKE 1 TABLET(5 MG) BY MOUTH DAILY  . Cholecalciferol (VITAMIN D3) 10000 units TABS Take 10,000 Units by mouth at bedtime.  . clobetasol ointment (TEMOVATE)  9.62 % Apply 1 application topically once a week. Applied to affected area of vagina and rectum  . gabapentin (NEURONTIN) 600 MG tablet TAKE 1 TABLET BY MOUTH AT BEDTIME  . Ginkgo Biloba 40 MG TABS Take 1 tablet by mouth daily. @@ noon  . hydrochlorothiazide (HYDRODIURIL) 12.5 MG tablet TAKE 1 TABLET BY MOUTH ONCE DAILY  . latanoprost (XALATAN) 0.005 % ophthalmic solution Place 1 drop into both eyes at bedtime.   . Multiple Vitamin (MULTIVITAMIN) tablet Take 1 tablet by mouth daily.  . multivitamin-lutein (OCUVITE-LUTEIN) CAPS capsule Take 1 capsule by mouth every morning.   . polyethylene glycol powder (GLYCOLAX/MIRALAX) 17 GM/SCOOP powder Take 17 g by mouth at bedtime.   Marland Kitchen RESVERATROL-GRAPE PO Take 1 capsule by mouth daily. CoQ10 Resveratrol & Grape Seed  . colchicine 0.6 MG tablet Take 1 tablet (0.6 mg total) by mouth daily. Take 2 tablets (1.2 mg total) by mouth stat.  Then 1 tablet by mouth daily (Patient taking differently: Take 0.6 mg by mouth daily. Take 2 tablets (1.2 mg total) by mouth stat.  Then 1 tablet by mouth daily)  . meclizine (ANTIVERT) 25 MG tablet Take 25 mg by mouth 3 (three) times daily as needed.  . Omega-3 Fatty Acids (FISH OIL) 1000 MG CAPS Take by mouth.  . sucralfate (CARAFATE) 1 g tablet TAKE 1 TABLET(1 GRAM) BY MOUTH FOUR TIMES DAILY AT BEDTIME WITH MEALS   No facility-administered medications prior to visit.    Review of Systems  Constitutional: Negative  for appetite change, chills, fatigue and fever.  Respiratory: Negative for chest tightness and shortness of breath.   Cardiovascular: Negative for chest pain and palpitations.  Gastrointestinal: Negative for abdominal pain, nausea and vomiting.  Neurological: Negative for dizziness and weakness.        Objective    BP 132/74 (BP Location: Left Arm, Patient Position: Sitting, Cuff Size: Large)   Pulse 79   Temp 98.2 F (36.8 C) (Oral)   Resp 16   Ht 5\' 2"  (1.575 m)   Wt 182 lb (82.6 kg)   SpO2 97%    BMI 33.29 kg/m  BP Readings from Last 3 Encounters:  03/29/21 132/74  09/28/20 (!) 163/81  09/15/20 133/80   Wt Readings from Last 3 Encounters:  03/29/21 182 lb (82.6 kg)  09/28/20 180 lb (81.6 kg)  09/15/20 179 lb 12.8 oz (81.6 kg)       Physical Exam Vitals reviewed.  Constitutional:      Appearance: Normal appearance.  HENT:     Right Ear: Tympanic membrane and external ear normal.     Left Ear: Tympanic membrane and external ear normal.     Mouth/Throat:     Pharynx: Oropharynx is clear.  Eyes:     General: No scleral icterus.    Conjunctiva/sclera: Conjunctivae normal.  Cardiovascular:     Rate and Rhythm: Normal rate and regular rhythm.     Heart sounds: Normal heart sounds.  Pulmonary:     Effort: Pulmonary effort is normal.     Breath sounds: Normal breath sounds.  Abdominal:     General: Bowel sounds are normal.     Palpations: Abdomen is soft.  Musculoskeletal:     Comments: Trace edema.  Skin:    General: Skin is warm and dry.  Neurological:     General: No focal deficit present.     Mental Status: She is alert and oriented to person, place, and time.     Comments: Patient walks without assistance.  Initially she pain to the right for the first couple of steps but then everything was normal after that.  Mild nystagmus noted on exam.  Cranial nerves are intact and there is no focal deficit on exam.  Psychiatric:        Mood and Affect: Mood normal.        Behavior: Behavior normal.        Thought Content: Thought content normal.        Judgment: Judgment normal.       No results found for any visits on 03/29/21.  Assessment & Plan     1. Unsteady gait Initially her first couple steps were unsteady but then the rest of her walk for me was normal.  At this time obtain head CT and refer to neurology. - CT Head Wo Contrast; Future - Ambulatory referral to Neurology  2. Vertigo of central origin Think this is contributing to her unsteady gait. - CT  Head Wo Contrast; Future  3. Benign essential HTN   4. Disorder of peripheral nervous system Neuropathy also contributing to gait issues  5. Avitaminosis D   6. Malignant neoplasm of upper-inner quadrant of right breast in female, estrogen receptor negative ([place])    No follow-ups on file.      I, Wilhemena Durie, MD, have reviewed all documentation for this visit. The documentation on 04/04/21 for the exam, diagnosis, procedures, and orders are all accurate and complete.    Carah Barrientes Cranford Mon,  MD  [facility] [phone] (phone) [phone] (fax)  [place]

## 2021-03-29 ENCOUNTER — Ambulatory Visit (INDEPENDENT_AMBULATORY_CARE_PROVIDER_SITE_OTHER): Payer: PPO | Admitting: Family Medicine

## 2021-03-29 ENCOUNTER — Other Ambulatory Visit: Payer: Self-pay

## 2021-03-29 ENCOUNTER — Encounter: Payer: Self-pay | Admitting: Family Medicine

## 2021-03-29 VITALS — BP 132/74 | HR 79 | Temp 98.2°F | Resp 16 | Ht 62.0 in | Wt 182.0 lb

## 2021-03-29 DIAGNOSIS — C50211 Malignant neoplasm of upper-inner quadrant of right female breast: Secondary | ICD-10-CM | POA: Diagnosis not present

## 2021-03-29 DIAGNOSIS — H814 Vertigo of central origin: Secondary | ICD-10-CM | POA: Diagnosis not present

## 2021-03-29 DIAGNOSIS — I10 Essential (primary) hypertension: Secondary | ICD-10-CM

## 2021-03-29 DIAGNOSIS — Z171 Estrogen receptor negative status [ER-]: Secondary | ICD-10-CM

## 2021-03-29 DIAGNOSIS — E559 Vitamin D deficiency, unspecified: Secondary | ICD-10-CM | POA: Diagnosis not present

## 2021-03-29 DIAGNOSIS — G64 Other disorders of peripheral nervous system: Secondary | ICD-10-CM | POA: Diagnosis not present

## 2021-03-29 DIAGNOSIS — R2681 Unsteadiness on feet: Secondary | ICD-10-CM | POA: Diagnosis not present

## 2021-04-15 ENCOUNTER — Ambulatory Visit: Payer: PPO

## 2021-04-15 ENCOUNTER — Telehealth: Payer: Self-pay

## 2021-04-15 NOTE — Telephone Encounter (Deleted)
.  ho

## 2021-04-16 ENCOUNTER — Other Ambulatory Visit: Payer: Self-pay

## 2021-04-16 ENCOUNTER — Ambulatory Visit
Admission: RE | Admit: 2021-04-16 | Discharge: 2021-04-16 | Disposition: A | Discharge status: Home / Self Care | Payer: PPO | Source: Ambulatory Visit | Attending: Family Medicine | Admitting: Family Medicine

## 2021-04-16 DIAGNOSIS — H814 Vertigo of central origin: Secondary | ICD-10-CM | POA: Insufficient documentation

## 2021-04-16 DIAGNOSIS — I6782 Cerebral ischemia: Secondary | ICD-10-CM | POA: Diagnosis not present

## 2021-04-16 DIAGNOSIS — R2681 Unsteadiness on feet: Secondary | ICD-10-CM | POA: Insufficient documentation

## 2021-04-16 DIAGNOSIS — R42 Dizziness and giddiness: Secondary | ICD-10-CM | POA: Diagnosis not present

## 2021-04-16 DIAGNOSIS — G319 Degenerative disease of nervous system, unspecified: Secondary | ICD-10-CM | POA: Diagnosis not present

## 2021-04-20 DIAGNOSIS — R42 Dizziness and giddiness: Secondary | ICD-10-CM | POA: Diagnosis not present

## 2021-04-20 DIAGNOSIS — H8112 Benign paroxysmal vertigo, left ear: Secondary | ICD-10-CM | POA: Diagnosis not present

## 2021-04-22 NOTE — Telephone Encounter (Signed)
No further notes

## 2021-04-27 DIAGNOSIS — H8112 Benign paroxysmal vertigo, left ear: Secondary | ICD-10-CM | POA: Diagnosis not present

## 2021-06-03 DIAGNOSIS — H40153 Residual stage of open-angle glaucoma, bilateral: Secondary | ICD-10-CM | POA: Diagnosis not present

## 2021-06-04 ENCOUNTER — Other Ambulatory Visit: Payer: Self-pay | Admitting: Family Medicine

## 2021-06-28 ENCOUNTER — Ambulatory Visit: Payer: Self-pay | Admitting: Family Medicine

## 2021-08-27 ENCOUNTER — Other Ambulatory Visit: Payer: Self-pay | Admitting: Family Medicine

## 2021-08-27 NOTE — Telephone Encounter (Signed)
Patient was given 6 month supply- will run short before next appointment- RF x1

## 2021-09-06 ENCOUNTER — Telehealth: Payer: Self-pay

## 2021-09-06 NOTE — Telephone Encounter (Signed)
Please advise. Patient has ov scheduled for 09/29/2021?

## 2021-09-06 NOTE — Telephone Encounter (Signed)
Copied from [place] [id number] [id number]. Topic: General - Other >> Sep 06, 2021  1:21 PM Bayard Beaver wrote: Reason for [id number] wants t schedule labs for lipid test.

## 2021-09-16 ENCOUNTER — Other Ambulatory Visit: Payer: Self-pay | Admitting: *Deleted

## 2021-09-16 DIAGNOSIS — I10 Essential (primary) hypertension: Secondary | ICD-10-CM

## 2021-09-16 DIAGNOSIS — M109 Gout, unspecified: Secondary | ICD-10-CM

## 2021-09-16 DIAGNOSIS — E781 Pure hyperglyceridemia: Secondary | ICD-10-CM

## 2021-09-16 DIAGNOSIS — Z0000 Encounter for general adult medical examination without abnormal findings: Secondary | ICD-10-CM

## 2021-09-16 NOTE — Telephone Encounter (Signed)
Labs ordered.

## 2021-09-17 NOTE — Telephone Encounter (Signed)
Left detailed message on pt's vm

## 2021-09-20 DIAGNOSIS — M25532 Pain in left wrist: Secondary | ICD-10-CM | POA: Insufficient documentation

## 2021-09-20 DIAGNOSIS — S63502A Unspecified sprain of left wrist, initial encounter: Secondary | ICD-10-CM | POA: Diagnosis not present

## 2021-09-21 DIAGNOSIS — H40153 Residual stage of open-angle glaucoma, bilateral: Secondary | ICD-10-CM | POA: Diagnosis not present

## 2021-09-22 ENCOUNTER — Other Ambulatory Visit: Payer: PPO

## 2021-09-22 DIAGNOSIS — M109 Gout, unspecified: Secondary | ICD-10-CM | POA: Diagnosis not present

## 2021-09-22 DIAGNOSIS — Z0000 Encounter for general adult medical examination without abnormal findings: Secondary | ICD-10-CM | POA: Diagnosis not present

## 2021-09-22 DIAGNOSIS — E781 Pure hyperglyceridemia: Secondary | ICD-10-CM | POA: Diagnosis not present

## 2021-09-22 DIAGNOSIS — I10 Essential (primary) hypertension: Secondary | ICD-10-CM | POA: Diagnosis not present

## 2021-09-23 LAB — COMPREHENSIVE METABOLIC PANEL
ALT: 15 IU/L (ref 0–32)
AST: 22 IU/L (ref 0–40)
Albumin/Globulin Ratio: 1.8 (ref 1.2–2.2)
Albumin: 3.9 g/dL (ref 3.5–4.6)
Alkaline Phosphatase: 54 IU/L (ref 44–121)
BUN/Creatinine Ratio: 13 (ref 12–28)
BUN: 12 mg/dL (ref 10–36)
Bilirubin Total: 0.6 mg/dL (ref 0.0–1.2)
CO2: 24 mmol/L (ref 20–29)
Calcium: 9 mg/dL (ref 8.7–10.3)
Chloride: 104 mmol/L (ref 96–106)
Creatinine, Ser: 0.93 mg/dL (ref 0.57–1.00)
Globulin, Total: 2.2 g/dL (ref 1.5–4.5)
Glucose: 100 mg/dL — ABNORMAL HIGH (ref 70–99)
Potassium: 4 mmol/L (ref 3.5–5.2)
Sodium: 142 mmol/L (ref 134–144)
Total Protein: 6.1 g/dL (ref 6.0–8.5)
eGFR: 58 mL/min/{1.73_m2} — ABNORMAL LOW (ref >59)

## 2021-09-23 LAB — LIPID PANEL
Chol/HDL Ratio: 3.2 ratio (ref 0.0–4.4)
Cholesterol, Total: 149 mg/dL (ref 100–199)
HDL: 47 mg/dL (ref >39)
LDL Chol Calc (NIH): 77 mg/dL (ref 0–99)
Triglycerides: 146 mg/dL (ref 0–149)
VLDL Cholesterol Cal: 25 mg/dL (ref 5–40)

## 2021-09-23 LAB — CBC WITH DIFFERENTIAL/PLATELET
Basophils Absolute: 0 10*3/uL (ref 0.0–0.2)
Basos: 1 %
EOS (ABSOLUTE): 0.1 10*3/uL (ref 0.0–0.4)
Eos: 2 %
Hematocrit: 39.4 % (ref 34.0–46.6)
Hemoglobin: 13.8 g/dL (ref 11.1–15.9)
Immature Grans (Abs): 0 10*3/uL (ref 0.0–0.1)
Immature Granulocytes: 0 %
Lymphocytes Absolute: 1.1 10*3/uL (ref 0.7–3.1)
Lymphs: 25 %
MCH: 31.9 pg (ref 26.6–33.0)
MCHC: 35 g/dL (ref 31.5–35.7)
MCV: 91 fL (ref 79–97)
Monocytes Absolute: 0.4 10*3/uL (ref 0.1–0.9)
Monocytes: 10 %
Neutrophils Absolute: 2.7 10*3/uL (ref 1.4–7.0)
Neutrophils: 62 %
Platelets: 187 10*3/uL (ref 150–450)
RBC: 4.33 x10E6/uL (ref 3.77–5.28)
RDW: 13.2 % (ref 11.7–15.4)
WBC: 4.3 10*3/uL (ref 3.4–10.8)

## 2021-09-23 LAB — URIC ACID: Uric Acid: 6.2 mg/dL (ref 3.1–7.9)

## 2021-09-23 LAB — TSH: TSH: 2.72 u[IU]/mL (ref 0.450–4.500)

## 2021-09-28 DIAGNOSIS — S63502A Unspecified sprain of left wrist, initial encounter: Secondary | ICD-10-CM | POA: Diagnosis not present

## 2021-09-28 DIAGNOSIS — S92912A Unspecified fracture of left toe(s), initial encounter for closed fracture: Secondary | ICD-10-CM | POA: Diagnosis not present

## 2021-09-29 ENCOUNTER — Ambulatory Visit (INDEPENDENT_AMBULATORY_CARE_PROVIDER_SITE_OTHER): Payer: PPO | Admitting: Family Medicine

## 2021-09-29 ENCOUNTER — Other Ambulatory Visit: Payer: Self-pay

## 2021-09-29 VITALS — BP 154/75 | HR 72 | Temp 98.4°F | Ht 62.0 in | Wt 182.0 lb

## 2021-09-29 DIAGNOSIS — L900 Lichen sclerosus et atrophicus: Secondary | ICD-10-CM

## 2021-09-29 DIAGNOSIS — I10 Essential (primary) hypertension: Secondary | ICD-10-CM

## 2021-09-29 DIAGNOSIS — Z0000 Encounter for general adult medical examination without abnormal findings: Secondary | ICD-10-CM

## 2021-09-29 DIAGNOSIS — C50211 Malignant neoplasm of upper-inner quadrant of right female breast: Secondary | ICD-10-CM

## 2021-09-29 DIAGNOSIS — Z171 Estrogen receptor negative status [ER-]: Secondary | ICD-10-CM | POA: Diagnosis not present

## 2021-09-29 DIAGNOSIS — Z23 Encounter for immunization: Secondary | ICD-10-CM | POA: Diagnosis not present

## 2021-09-29 NOTE — Progress Notes (Signed)
Complete physical exam   Patient: Kelly Clark   DOB: 01-16-1929   87 y.o. Female  MRN: [id number] Visit Date: 09/29/2021  Today's healthcare provider: Wilhemena Durie, MD   No chief complaint on file.  Subjective    Kelly Clark is a 87 y.o. female who presents today for a complete physical exam.  Annual wellness She reports consuming a general diet.  She generally feels well. She reports sleeping well. She does not have additional problems to discuss today.  Patient continues to live independently.  Blood pressures at home runs 130s over 80s. She feels well. HPI    Past Medical History:  Diagnosis Date   Basal cell carcinoma 2019   RIGHT THUMB REGION   Breast cancer ([place]) 06/27/2018   RIGHT   Cancer ([place]) 1977   melanoma . SURGERY RIGHT ARM   Hemorrhoids    Hypertension    Hypertriglyceridemia    Insomnia    Menopausal disorder    Neutropenia ([facility])    Paresthesia    Peripheral neuropathy    Tinea corporis    Vitamin D deficiency    Past Surgical History:  Procedure Laterality Date   ABDOMINAL HYSTERECTOMY  1977   BREAST BIOPSY Bilateral 1977   benign   BREAST BIOPSY Right 06/27/2018   INVASIVE MAMMARY CARCINOMA, NO SPECIAL TYPE. 2 oclock ER/PR negative HER2 positive   BREAST EXCISIONAL BIOPSY Right 07/23/2018   lumpectomy   BREAST LUMPECTOMY Right 2019   [facility]   BREAST LUMPECTOMY WITH SENTINEL LYMPH NODE BIOPSY Right 07/23/2018   6 mm ER/PR negative, Her 2 neu 3+; node negative.  Surgeon: Robert Bellow, MD;  Location: [facility] ORS;  Service: General;  Laterality: Right;   COLONOSCOPY  2007   ELECTROCARDIOGRAM     EYE SURGERY Bilateral    CATARACT EXTRACTIONS   Social History   Socioeconomic History   Marital status: Widowed    Spouse name: Not on file   Number of children: 1   Years of education: H/S   Highest education level: Some college, no degree  Occupational History   Occupation: Retired  Tobacco Use   Smoking status: Never    Smokeless tobacco: Never  Vaping Use   Vaping Use: Never used  Substance and Sexual Activity   Alcohol use: No   Drug use: No   Sexual activity: Not Currently  Other Topics Concern   Not on file  Social History Narrative   Not on file   Social Determinants of Health   Financial Resource Strain: Not on file  Food Insecurity: Not on file  Transportation Needs: Not on file  Physical Activity: Not on file  Stress: Not on file  Social Connections: Not on file  Intimate Partner Violence: Not on file   Family Status  Relation Name Status   Mother  Deceased at age 48   Father  Deceased at age 56   Sister  Deceased   MGM  Deceased at age 18   Daughter  Alive   Neg Hx  (Not Specified)   Family History  Problem Relation Age of Onset   Heart attack Mother    Congestive Heart Failure Mother    Stroke Father    Colon cancer Sister 77   Throat cancer Maternal Grandmother 48   Healthy Daughter    Breast cancer Neg Hx    No Known Allergies  Patient Care Team: [name]., MD as PCP - General (Family  Medicine) Irene Limbo., MD as Consulting Physician (Ophthalmology) Debbrah Alar, MD as Consulting Physician (Dermatology) Earline Mayotte, MD as Consulting Physician (General Surgery)   Medications: Outpatient Medications Prior to Visit  Medication Sig   amLODipine (NORVASC) 5 MG tablet TAKE 1 TABLET(5 MG) BY MOUTH DAILY   Cholecalciferol (VITAMIN D3) 10000 units TABS Take 10,000 Units by mouth at bedtime.   clobetasol ointment (TEMOVATE) 0.05 % Apply 1 application topically once a week. Applied to affected area of vagina and rectum   gabapentin (NEURONTIN) 600 MG tablet TAKE 1 TABLET BY MOUTH AT BEDTIME   Ginkgo Biloba 40 MG TABS Take 1 tablet by mouth daily. @@ noon   hydrochlorothiazide (HYDRODIURIL) 12.5 MG tablet TAKE 1 TABLET BY MOUTH ONCE DAILY   latanoprost (XALATAN) 0.005 % ophthalmic solution Place 1 drop into both eyes at bedtime.    Multiple  Vitamin (MULTIVITAMIN) tablet Take 1 tablet by mouth daily.   multivitamin-lutein (OCUVITE-LUTEIN) CAPS capsule Take 1 capsule by mouth every morning.    polyethylene glycol powder (GLYCOLAX/MIRALAX) 17 GM/SCOOP powder Take 17 g by mouth at bedtime.    RESVERATROL-GRAPE PO Take 1 capsule by mouth daily. CoQ10 Resveratrol & Grape Seed   colchicine 0.6 MG tablet Take 1 tablet (0.6 mg total) by mouth daily. Take 2 tablets (1.2 mg total) by mouth stat.  Then 1 tablet by mouth daily (Patient taking differently: Take 0.6 mg by mouth daily. Take 2 tablets (1.2 mg total) by mouth stat.  Then 1 tablet by mouth daily)   meclizine (ANTIVERT) 25 MG tablet Take 25 mg by mouth 3 (three) times daily as needed.   Omega-3 Fatty Acids (FISH OIL) 1000 MG CAPS Take by mouth.   sucralfate (CARAFATE) 1 g tablet TAKE 1 TABLET(1 GRAM) BY MOUTH FOUR TIMES DAILY AT BEDTIME WITH MEALS   No facility-administered medications prior to visit.    Review of Systems  Constitutional: Negative.   HENT: Negative.    Eyes: Negative.   Respiratory: Negative.    Cardiovascular:  Positive for leg swelling.  Gastrointestinal: Negative.   Endocrine: Negative.   Genitourinary:  Positive for frequency.  Musculoskeletal:  Positive for gait problem.  Skin: Negative.   Allergic/Immunologic: Negative.   Hematological: Negative.   Psychiatric/Behavioral: Negative.        Objective    BP (!) 154/75 (BP Location: Left Arm, Patient Position: Sitting, Cuff Size: Normal)   Pulse 72   Temp 98.4 F (36.9 C) (Oral)   Ht 5\' 2"  (1.575 m)   Wt 182 lb (82.6 kg)   SpO2 98%   BMI 33.29 kg/m  BP Readings from Last 3 Encounters:  09/29/21 (!) 154/75  03/29/21 132/74  09/28/20 (!) 163/81   Wt Readings from Last 3 Encounters:  09/29/21 182 lb (82.6 kg)  03/29/21 182 lb (82.6 kg)  09/28/20 180 lb (81.6 kg)      Physical Exam Constitutional:      Appearance: Normal appearance. She is normal weight.  HENT:     Head: Normocephalic  and atraumatic.     Right Ear: Tympanic membrane, ear canal and external ear normal.     Left Ear: Tympanic membrane, ear canal and external ear normal.     Nose: Nose normal.     Mouth/Throat:     Mouth: Mucous membranes are moist.     Pharynx: Oropharynx is clear.  Eyes:     Extraocular Movements: Extraocular movements intact.     Conjunctiva/sclera: Conjunctivae normal.  Pupils: Pupils are equal, round, and reactive to light.  Cardiovascular:     Rate and Rhythm: Normal rate and regular rhythm.     Pulses: Normal pulses.     Heart sounds: Normal heart sounds.  Pulmonary:     Effort: Pulmonary effort is normal.     Breath sounds: Normal breath sounds.  Abdominal:     General: Abdomen is flat. Bowel sounds are normal.     Palpations: Abdomen is soft.  Musculoskeletal:     Cervical back: Normal range of motion and neck supple.  Skin:    General: Skin is warm and dry.  Neurological:     General: No focal deficit present.     Mental Status: She is alert and oriented to person, place, and time. Mental status is at baseline.  Psychiatric:        Mood and Affect: Mood normal.        Behavior: Behavior normal.        Thought Content: Thought content normal.        Judgment: Judgment normal.      Last depression screening scores PHQ 2/9 Scores 09/29/2021 09/24/2020 09/23/2019  PHQ - 2 Score 0 0 0  PHQ- 9 Score 0 - -   Last fall risk screening Fall Risk  09/29/2021  Falls in the past year? 1  Comment -  Number falls in past yr: 0  Injury with Fall? 1   Last Audit-C alcohol use screening Alcohol Use Disorder Test (AUDIT) 09/29/2021  1. How often do you have a drink containing alcohol? 0  2. How many drinks containing alcohol do you have on a typical day when you are drinking? 0  3. How often do you have six or more drinks on one occasion? 0  AUDIT-C Score 0  Alcohol Brief Interventions/Follow-up -   A score of 3 or more in women, and 4 or more in men indicates  increased risk for alcohol abuse, EXCEPT if all of the points are from question 1   No results found for any visits on 09/29/21.  Assessment & Plan    Routine Health Maintenance and Physical Exam  Exercise Activities and Dietary recommendations  Goals      Exercise 150 minutes per week (moderate activity)     Increase water intake     Recommend increasing water intake to 4-6 glasses a day.         Immunization History  Administered Date(s) Administered   Fluad Quad(high Dose 65+) 09/24/2019, 09/28/2020   Influenza, High Dose Seasonal PF 09/23/2015, 10/05/2016, 11/01/2017, 09/19/2018   Moderna Sars-Covid-2 Vaccination 12/31/2019, 01/28/2020   Pneumococcal Conjugate-13 08/01/2014   Pneumococcal Polysaccharide-23 06/03/1999   Td 01/18/2007    Health Maintenance  Topic Date Due   Zoster Vaccines- Shingrix (1 of 2) Never done   COVID-19 Vaccine (3 - Moderna risk series) 02/25/2020   INFLUENZA VACCINE  07/19/2021   TETANUS/TDAP  12/19/2026 (Originally 01/18/2017)   DEXA SCAN  Completed   HPV VACCINES  Aged Out    Discussed health benefits of physical activity, and encouraged her to engage in regular exercise appropriate for her age and condition.  1. Encounter for Medicare annual wellness exam   2. Annual physical exam   3. Need for influenza vaccination  - Flu Vaccine QUAD High Dose(Fluad)  4. Benign essential HTN   5. Malignant neoplasm of upper-inner quadrant of right breast in female, estrogen receptor negative ([place])   6. Lichen sclerosus Asymptomatic  No follow-ups on file.     I, Wilhemena Durie, MD, have reviewed all documentation for this visit. The documentation on 10/09/21 for the exam, diagnosis, procedures, and orders are all accurate and complete.    Anaysia Germer Cranford Mon, MD  [facility] [phone] (phone) [phone] (fax)  [place]

## 2021-10-06 LAB — HEMOGLOBIN A1C: Hemoglobin A1C: 5.3

## 2021-10-14 ENCOUNTER — Encounter: Payer: Self-pay | Admitting: *Deleted

## 2021-11-22 ENCOUNTER — Other Ambulatory Visit: Payer: Self-pay | Admitting: Family Medicine

## 2021-12-21 ENCOUNTER — Other Ambulatory Visit: Payer: Self-pay | Admitting: Family Medicine

## 2021-12-21 DIAGNOSIS — Z1231 Encounter for screening mammogram for malignant neoplasm of breast: Secondary | ICD-10-CM

## 2021-12-29 DIAGNOSIS — Z85828 Personal history of other malignant neoplasm of skin: Secondary | ICD-10-CM | POA: Diagnosis not present

## 2021-12-29 DIAGNOSIS — L309 Dermatitis, unspecified: Secondary | ICD-10-CM | POA: Diagnosis not present

## 2021-12-29 DIAGNOSIS — L821 Other seborrheic keratosis: Secondary | ICD-10-CM | POA: Diagnosis not present

## 2021-12-29 DIAGNOSIS — Z85820 Personal history of malignant melanoma of skin: Secondary | ICD-10-CM | POA: Diagnosis not present

## 2021-12-29 DIAGNOSIS — Z08 Encounter for follow-up examination after completed treatment for malignant neoplasm: Secondary | ICD-10-CM | POA: Diagnosis not present

## 2022-01-28 ENCOUNTER — Other Ambulatory Visit: Payer: Self-pay

## 2022-01-28 ENCOUNTER — Ambulatory Visit
Admission: RE | Admit: 2022-01-28 | Discharge: 2022-01-28 | Disposition: A | Discharge status: Home / Self Care | Payer: PPO | Source: Ambulatory Visit | Attending: Family Medicine | Admitting: Family Medicine

## 2022-01-28 DIAGNOSIS — Z1231 Encounter for screening mammogram for malignant neoplasm of breast: Secondary | ICD-10-CM | POA: Insufficient documentation

## 2022-01-28 DIAGNOSIS — S61208A Unspecified open wound of other finger without damage to nail, initial encounter: Secondary | ICD-10-CM | POA: Diagnosis not present

## 2022-01-28 DIAGNOSIS — Z23 Encounter for immunization: Secondary | ICD-10-CM | POA: Diagnosis not present

## 2022-02-03 DIAGNOSIS — Z853 Personal history of malignant neoplasm of breast: Secondary | ICD-10-CM | POA: Diagnosis not present

## 2022-02-10 DIAGNOSIS — H40153 Residual stage of open-angle glaucoma, bilateral: Secondary | ICD-10-CM | POA: Diagnosis not present

## 2022-02-19 ENCOUNTER — Other Ambulatory Visit: Payer: Self-pay | Admitting: Family Medicine

## 2022-03-30 ENCOUNTER — Encounter: Payer: Self-pay | Admitting: Family Medicine

## 2022-03-30 ENCOUNTER — Ambulatory Visit (INDEPENDENT_AMBULATORY_CARE_PROVIDER_SITE_OTHER): Payer: PPO | Admitting: Family Medicine

## 2022-03-30 VITALS — BP 151/82 | HR 66 | Temp 98.0°F | Resp 16

## 2022-03-30 DIAGNOSIS — I10 Essential (primary) hypertension: Secondary | ICD-10-CM | POA: Diagnosis not present

## 2022-03-30 DIAGNOSIS — R2681 Unsteadiness on feet: Secondary | ICD-10-CM | POA: Diagnosis not present

## 2022-03-30 DIAGNOSIS — N952 Postmenopausal atrophic vaginitis: Secondary | ICD-10-CM | POA: Diagnosis not present

## 2022-03-30 DIAGNOSIS — G64 Other disorders of peripheral nervous system: Secondary | ICD-10-CM

## 2022-03-30 DIAGNOSIS — C50211 Malignant neoplasm of upper-inner quadrant of right female breast: Secondary | ICD-10-CM | POA: Diagnosis not present

## 2022-03-30 DIAGNOSIS — E559 Vitamin D deficiency, unspecified: Secondary | ICD-10-CM | POA: Diagnosis not present

## 2022-03-30 DIAGNOSIS — L900 Lichen sclerosus et atrophicus: Secondary | ICD-10-CM | POA: Diagnosis not present

## 2022-03-30 DIAGNOSIS — Z171 Estrogen receptor negative status [ER-]: Secondary | ICD-10-CM | POA: Diagnosis not present

## 2022-03-30 MED ORDER — LOSARTAN POTASSIUM 50 MG PO TABS: 50.0000 mg | ORAL_TABLET | Freq: Every day | ORAL | 1 refills | Status: DC

## 2022-03-30 NOTE — Progress Notes (Signed)
?  ? ? ?Established patient visit ? ?I,April Miller,acting as a scribe for Wilhemena Durie, MD.,have documented all relevant documentation on the behalf of Wilhemena Durie, MD,as directed by  Wilhemena Durie, MD while in the presence of Wilhemena Durie, MD. ? ? ? ?Patient: Kelly Clark   DOB: [birth year]   88 y.o. Female  MRN: [id number] ?Visit Date: 03/30/2022 ? ?Today's healthcare provider: Wilhemena Durie, MD  ? ?Chief Complaint  ?Patient presents with  ? Follow-up  ? Hypertension  ? ?Subjective  ?  ?HPI  ?Patient is a very young 88 year old who comes in today for follow-up.  She is going to [place] soon to see her daughter.  Blood pressure at home runs about 140/80.  She is asymptomatic.  Patient does complain of overactive bladder.  She is very active for her age. ? ?Hypertension, follow-up ? ?BP Readings from Last 3 Encounters:  ?03/30/22 (!) 151/82  ?09/29/21 (!) 154/75  ?03/29/21 132/74  ? Wt Readings from Last 3 Encounters:  ?09/29/21 182 lb (82.6 kg)  ?03/29/21 182 lb (82.6 kg)  ?09/28/20 180 lb (81.6 kg)  ?  ? ?She was last seen for hypertension 7 months ago.  ?BP at that visit was 154/75.  ?Management since that visit includes on amlodipine and HCTZ. ? ?She reports good compliance with treatment. ?She is not having side effects. none ?She is following a Regular, Low Sodium diet. ?She is exercising. ?She does not smoke. ? ?Use of agents associated with hypertension: none.  ? ?Outside blood pressures are 150/82-140/80. ? ?Pertinent labs ?Lab Results  ?Component Value Date  ? CHOL 149 09/22/2021  ? HDL 47 09/22/2021  ? [place] 77 09/22/2021  ? TRIG 146 09/22/2021  ? CHOLHDL 3.2 09/22/2021  ? Lab Results  ?Component Value Date  ? NA 142 09/22/2021  ? K 4.0 09/22/2021  ? CREATININE 0.93 09/22/2021  ? EGFR 58 (L) 09/22/2021  ? GLUCOSE 100 (H) 09/22/2021  ? TSH 2.720 09/22/2021  ?  ? ?The ASCVD Risk score (Arnett DK, et al., 2019) failed to calculate for the following reasons: ?  The 2019 ASCVD  risk score is only valid for ages 36 to 17 ? ?--------------------------------------------------------------------------------------------------- ? ? ?Medications: ?Outpatient Medications Prior to Visit  ?Medication Sig  ? amLODipine (NORVASC) 5 MG tablet TAKE 1 TABLET(5 MG) BY MOUTH DAILY  ? Cholecalciferol (VITAMIN D3) 10000 units TABS Take 10,000 Units by mouth at bedtime.  ? clobetasol ointment (TEMOVATE) 6.01 % Apply 1 application topically once a week. Applied to affected area of vagina and rectum  ? colchicine 0.6 MG tablet Take 1 tablet (0.6 mg total) by mouth daily. Take 2 tablets (1.2 mg total) by mouth stat.  Then 1 tablet by mouth daily (Patient taking differently: Take 0.6 mg by mouth daily. Take 2 tablets (1.2 mg total) by mouth stat.  Then 1 tablet by mouth daily)  ? gabapentin (NEURONTIN) 600 MG tablet TAKE 1 TABLET BY MOUTH AT BEDTIME  ? Ginkgo Biloba 40 MG TABS Take 1 tablet by mouth daily. @@ noon  ? hydrochlorothiazide (HYDRODIURIL) 12.5 MG tablet TAKE 1 TABLET BY MOUTH ONCE DAILY  ? latanoprost (XALATAN) 0.005 % ophthalmic solution Place 1 drop into both eyes at bedtime.   ? Multiple Vitamin (MULTIVITAMIN) tablet Take 1 tablet by mouth daily.  ? multivitamin-lutein (OCUVITE-LUTEIN) CAPS capsule Take 1 capsule by mouth every morning.   ? Omega-3 Fatty Acids (FISH OIL) 1000 MG CAPS Take by mouth.  ?  polyethylene glycol powder (GLYCOLAX/MIRALAX) 17 GM/SCOOP powder Take 17 g by mouth at bedtime.   ? RESVERATROL-GRAPE PO Take 1 capsule by mouth daily. CoQ10 Resveratrol & Grape Seed  ? [DISCONTINUED] meclizine (ANTIVERT) 25 MG tablet Take 25 mg by mouth 3 (three) times daily as needed.  ? [DISCONTINUED] sucralfate (CARAFATE) 1 g tablet TAKE 1 TABLET(1 GRAM) BY MOUTH FOUR TIMES DAILY AT BEDTIME WITH MEALS  ? ?No facility-administered medications prior to visit.  ? ? ?Review of Systems  ?Constitutional:  Negative for appetite change, chills, fatigue and fever.  ?Respiratory:  Negative for chest  tightness and shortness of breath.   ?Cardiovascular:  Negative for chest pain and palpitations.  ?Gastrointestinal:  Negative for abdominal pain, nausea and vomiting.  ?Neurological:  Negative for dizziness and weakness.  ? ?Last metabolic panel ?Lab Results  ?Component Value Date  ? GLUCOSE 100 (H) 09/22/2021  ? NA 142 09/22/2021  ? K 4.0 09/22/2021  ? CL 104 09/22/2021  ? CO2 24 09/22/2021  ? BUN 12 09/22/2021  ? CREATININE 0.93 09/22/2021  ? EGFR 58 (L) 09/22/2021  ? CALCIUM 9.0 09/22/2021  ? PHOS 4.0 09/19/2018  ? PROT 6.1 09/22/2021  ? ALBUMIN 3.9 09/22/2021  ? LABGLOB 2.2 09/22/2021  ? AGRATIO 1.8 09/22/2021  ? BILITOT 0.6 09/22/2021  ? ALKPHOS 54 09/22/2021  ? AST 22 09/22/2021  ? ALT 15 09/22/2021  ? ANIONGAP 8 07/09/2018  ? ?  ?  Objective  ?  ?BP (!) 151/82 (BP Location: Left Arm, Patient Position: Sitting, Cuff Size: Large)   Pulse 66   Temp 98 ?F (36.7 ?C) (Temporal)   Resp 16   SpO2 96%  ?BP Readings from Last 3 Encounters:  ?03/30/22 (!) 151/82  ?09/29/21 (!) 154/75  ?03/29/21 132/74  ? ?Wt Readings from Last 3 Encounters:  ?09/29/21 182 lb (82.6 kg)  ?03/29/21 182 lb (82.6 kg)  ?09/28/20 180 lb (81.6 kg)  ? ?  ? ?Physical Exam ?Vitals reviewed.  ?Constitutional:   ?   Appearance: Normal appearance. She is well-developed.  ?   Comments: She appears much younger than her age of 52  ?HENT:  ?   Head: Normocephalic and atraumatic.  ?Eyes:  ?   General: No scleral icterus. ?Neck:  ?   Thyroid: No thyromegaly.  ?Cardiovascular:  ?   Rate and Rhythm: Normal rate and regular rhythm.  ?   Heart sounds: Normal heart sounds.  ?Pulmonary:  ?   Effort: Pulmonary effort is normal.  ?   Breath sounds: Normal breath sounds.  ?Abdominal:  ?   Palpations: Abdomen is soft.  ?Lymphadenopathy:  ?   Cervical: No cervical adenopathy.  ?Skin: ?   General: Skin is warm and dry.  ?Neurological:  ?   Mental Status: She is alert and oriented to person, place, and time.  ?Psychiatric:     ?   Behavior: Behavior normal.     ?    Thought Content: Thought content normal.     ?   Judgment: Judgment normal.  ?  ? ? ?No results found for any visits on 03/30/22. ? Assessment & Plan  ?  ? ?1. Benign essential HTN ?Bladder complaints at an elevated blood pressure at this time we will stop HCTZ and try losartan 50 mg daily and see if we get a better overall response. ?- losartan (COZAAR) 50 MG tablet; Take 1 tablet (50 mg total) by mouth daily.  Dispense: 90 tablet; Refill: 1 ? ?2. Unsteady gait ?  Watching her move she has amazing balance overall.  She is very strong and gets up easily from the chair. ?We discussed being safe with her movement and walking and I think a peripheral neuropathy contributes to her feeling of unsteady gait ? ?3. Lichen sclerosus ?Per GYN ? ?4. Disorder of peripheral nervous system ? ? ?5. Vaginal atrophy ? ? ?6. H/o Malignant neoplasm of upper-inner quadrant of right breast in female, estrogen receptor negative ([place]) ? ? ?7. Avitaminosis D ? ? ? ?Return in about 4 months (around 07/30/2022).  ?   ? ?I, Wilhemena Durie, MD, have reviewed all documentation for this visit. The documentation on 04/06/22 for the exam, diagnosis, procedures, and orders are all accurate and complete. ? ? ? ?Kelly Mattox Cranford Mon, MD  ?[facility] ?[phone] (phone) ?[phone] (fax) ? ?[facility] ?

## 2022-03-30 NOTE — Patient Instructions (Signed)
STOP HYDROCHLOROTHIAZIDE (HCTZ).  ?

## 2022-05-19 ENCOUNTER — Telehealth: Payer: Self-pay | Admitting: Family Medicine

## 2022-05-19 NOTE — Telephone Encounter (Signed)
Pt called in states was told by pharmacy that refill on the gabapentin is denied but I don't show this. I told pt of 48-72 hrs to fill

## 2022-05-20 NOTE — Telephone Encounter (Signed)
[facility], they stated that the patient picked up the prescription for Gabapentin.

## 2022-05-24 DIAGNOSIS — H8112 Benign paroxysmal vertigo, left ear: Secondary | ICD-10-CM | POA: Diagnosis not present

## 2022-05-24 DIAGNOSIS — H6123 Impacted cerumen, bilateral: Secondary | ICD-10-CM | POA: Diagnosis not present

## 2022-06-01 DIAGNOSIS — H8112 Benign paroxysmal vertigo, left ear: Secondary | ICD-10-CM | POA: Diagnosis not present

## 2022-06-13 DIAGNOSIS — H40153 Residual stage of open-angle glaucoma, bilateral: Secondary | ICD-10-CM | POA: Diagnosis not present

## 2022-06-20 ENCOUNTER — Ambulatory Visit: Payer: PPO | Attending: Unknown Physician Specialty | Admitting: Physical Therapy

## 2022-06-20 DIAGNOSIS — R42 Dizziness and giddiness: Secondary | ICD-10-CM | POA: Diagnosis not present

## 2022-06-20 DIAGNOSIS — R2681 Unsteadiness on feet: Secondary | ICD-10-CM | POA: Diagnosis not present

## 2022-06-20 NOTE — Therapy (Signed)
OUTPATIENT PHYSICAL THERAPY VESTIBULAR EVALUATION     Patient Name: Kelly Clark MRN: [id number] DOB:11/21/1929, 88 y.o., female Today's Date: 06/20/2022  PCP: Maple Hudson., MD REFERRING PROVIDER: Linus Salmons, MD   PT End of Session - 06/20/22 1505     Visit Number 1    Number of Visits 13    Date for PT Re-Evaluation 09/12/22    PT Start Time 0309    PT Stop Time 0416    PT Time Calculation (min) 67 min    Equipment Utilized During Treatment Gait belt    Activity Tolerance Patient tolerated treatment well    Behavior During Therapy [facility] for tasks assessed/performed             Past Medical History:  Diagnosis Date   Basal cell carcinoma 2019   RIGHT THUMB REGION   Breast cancer (HCC) 06/27/2018   RIGHT   Cancer (HCC) 1977   melanoma . SURGERY RIGHT ARM   Hemorrhoids    Hypertension    Hypertriglyceridemia    Insomnia    Menopausal disorder    Neutropenia (HCC)    Paresthesia    Peripheral neuropathy    Tinea corporis    Vitamin D deficiency    Past Surgical History:  Procedure Laterality Date   ABDOMINAL HYSTERECTOMY  1977   BREAST BIOPSY Bilateral 1977   benign   BREAST BIOPSY Right 06/27/2018   INVASIVE MAMMARY CARCINOMA, NO SPECIAL TYPE. 2 oclock ER/PR negative HER2 positive   BREAST EXCISIONAL BIOPSY Right 07/23/2018   lumpectomy   BREAST LUMPECTOMY Right 2019   [facility]   BREAST LUMPECTOMY WITH SENTINEL LYMPH NODE BIOPSY Right 07/23/2018   6 mm ER/PR negative, Her 2 neu 3+; node negative.  Surgeon: Earline Mayotte, MD;  Location: [facility] ORS;  Service: General;  Laterality: Right;   COLONOSCOPY  2007   ELECTROCARDIOGRAM     EYE SURGERY Bilateral    CATARACT EXTRACTIONS   Patient Active Problem List   Diagnosis Date Noted   Lichen sclerosus 11/05/2019   Actinic keratosis 09/21/2018   Malignant neoplasm of upper-inner quadrant of right breast in female, estrogen receptor negative (HCC) 07/11/2018   Benign essential HTN 09/08/2017    Adult BMI 30+ 07/06/2015   Hemorrhoid 07/06/2015   Disorder of peripheral nervous system 07/06/2015   Avitaminosis D 07/06/2015   Vaginal atrophy 07/06/2015   Abnormal LFTs 08/21/2009   Cannot sleep 05/23/2000   Hypertriglyceridemia 05/19/2000    ONSET DATE: Approximately 3 months ago  REFERRING DIAG: Left BPPV   Rationale for Evaluation and Treatment Rehabilitation  THERAPY DIAG:  Dizziness and giddiness  SUBJECTIVE:   SUBJECTIVE STATEMENT: Patient's chief concern is that she veers to the right and backwards at times and is off balance.  Patient reports that when she first gets up with transitional movements she gets unsteadiness.  Patient states that she can stoop and bend without difficulty and that once she gets stabilized upon standing she can walk without difficulty.  Patient reports "I can walk without any trouble; I can walk good and fast".    Pt accompanied by: self  PERTINENT HISTORY: Patient has been seen in the clinic in 2018 due to dizziness and potential BPPV.  Patient reports that the difficulties that she is experiencing now are different from her prior episode. Patient's chief concern is that she veers to the right and backwards at times and is off balance.  Patient reports that when she first gets up with transitional  movements she gets unsteadiness.  Patient states that she can stoop and bend without difficulty and that once she gets stabilized upon standing she can walk without difficulty.  Patient reports "I can walk without any trouble; I can walk good and fast".  Patient denies vertigo and dizziness, and reports unsteadiness and staggering.  Patient reports she saw Dr. Jenne Campus and had two Epley maneuvers performed on 2 separate occasions in the ENT office. 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  Patient reports that up until a few months ago she was going to the [facility] on Tuesdays and Thursdays and participating in a cardio type class and yoga class.   Patient states that she began to have burning sensation in her feet and legs which patient reports she believes is neuropathy.  Patient reports that "the pads" behind her toes seem to be inflamed and that she is going to see her primary care doctor on August 16 at the [facility] in regards to these symptoms.  Patient reports that she took some time off for the past few months from her exercise classes due to her imbalance and burning sensation in her legs.  Patient reports that she lives alone and she is concerned about falling. Dr. Jenne Campus told her to carry the cane with her. Pt states she does not really need it and "90% of the time it is not with me". Pt states otherwise she feels great and does not have any other issues or pains besides the feet. Pt states she never goes barefooted even in the house and she wears good shoes with arch support and non-skid soles. Pt states she does not wear flip flops or bedrooms shoes.  Patient reports that she drove herself to the clinic this date.  SYMPTOM BEHAVIOR: Non-Vestibular symptoms:  denies Type of dizziness: Imbalance (Disequilibrium) and Unsteady with head/body turns Frequency: Daily  Duration: Transient imbalance upon standing   Aggravating factors:  Standing up Relieving factors:  Nothing Progression of symptoms: better History of similar episodes: No prior episodes of imbalance with transitional movements. Description of dizziness: unsteadiness Auditory complaints (tinnitus, pain, drainage): denies Vision (last eye exam, diplopia, recent changes): pt wears glasses, she just had an eye exam and they said everything was great.  Current Symptoms: (dysarthria, dysphagia, drop attacks, bowel and bladder changes, recent weight loss/gain)    Review of systems negative for red flags.      DIAGNOSTIC FINDINGS:  PAIN:  Are you having pain? No  PRECAUTIONS: None  FALLS: Has patient fallen in last 6 months? No  LIVING ENVIRONMENT: Lives with:  lives with their family and lives alone Lives in: House/apartment Stairs: Yes: Internal: 1 flight down to the basement with washer/dryer but states once a week she goes down there and she has rails on both sides. steps; on right going up and on left going up ; pt reports she has 4 steps with right rail.  Has following equipment at home: Single point cane in the community; no AD inside the home.  PLOF: Independent, Independent with household mobility without device, Independent with community mobility with device, Independent with gait, and Leisure: shopping, play cards and games bridge and [place] and canasta  PATIENT GOALS; pt would like to not have falls and would like to not have episodes of imbalance when she stands up.  Patient would like to be able to resume her yoga classes because she likes the stretching component.    OBJECTIVE/ VESTIBULAR ASSESSMENT:  [facility] PT Assessment - 06/24/22 1349       Standardized Balance Assessment   Standardized Balance Assessment Dynamic Gait Index      Dynamic Gait Index   Level Surface Normal    Change in Gait Speed Normal    Gait with Horizontal Head Turns Normal    Gait with Vertical Head Turns Normal    Gait and Pivot Turn Normal    Step Over Obstacle Normal    Step Around Obstacles Normal    Steps Mild Impairment    Total Score 23             COGNITION: Overall cognitive status: Within functional limits for tasks assessed   SENSATION: Pt denies numbness in extremities but states she gets tingling in bilateral feet and ankle.  Patient reports that "Magnilife" helps to numb her feet after she applies and then she can sleep.  Semmes Weinstein monofilament testing revealed: Monofilament 5.18 left great toe and right 5th toe diminished sensation. Otherwise intact throughout ankle and feet bilaterally. Monofilament 4.74 inconsistent sensation right foot plantar surface toes and MT heads.  COORDINATION: Finger to Nose: Deferred    Pronator Drift: Deferred   Cervical ROM:   WFL and painless in all planes. No Villela deficits identified  MMT:   MMT Right 06/20/2022 Left 06/20/2022  Hip flexion 4/5 4/5  Hip abduction 5/5 5/5  Hip adduction 5/5 5/5  Hip internal rotation    Hip external rotation    Knee flexion 5/5 5/5  Knee extension 5/5 5/5  Ankle dorsiflexion 5/5 5/5  (Blank rows = not tested)  TRANSFERS: Sit to stand: Modified independence and uses single-point cane and noted mild imbalance upon standing 1 time during session Stand to sit: Complete Independence  GAIT: Gait pattern:   , WFL, and step through pattern Distance walked: 150' times multiple reps with single-point cane. Scanning of visual environment with gait is: Fair without loss of balance Assistive device utilized: Single point cane and patient tends to keep the cane lifted off the ground majority of the time Level of assistance: Modified independence  PATIENT SURVEYS:  ABC scale 67.5% DHI 16/100 FOTO 64/100  ORTHOSTATICS: not done  POSTURAL CONTROL TESTS:  Clinical Test of Sensory Interaction for Balance (CTSIB):  CONDITION TIME STRATEGY SWAY  Eyes open, firm surface 30 seconds ankle +1  Eyes closed, firm surface 30 seconds ankle +2  Eyes open, foam surface 8-9 seconds ankle +4  Eyes closed, foam surface 5-6 seconds ankle +4     POSITIONAL TESTING: Other:     BPPV TESTS:  Symptoms Duration Intensity Nystagmus  Left Dix-Hallpike None N/A N/A None observed  Right Dix-Hallpike None N/A N/A None observed  Left Head Roll None N/A N/A None observed  Right Head Roll None N/A N/A None observed    OCULOMOTOR / VESTIBULAR TESTING: Oculomotor Exam- Room Light  Normal Abnormal Comments  Ocular Alignment N    Ocular ROM N    Spontaneous Nystagmus N    Gaze evoked Nystagmus N    Smooth Pursuit N    Saccades  Abn   VOR   Deferred this date  VOR Cancellation N    Left Head Impulse N    Right Head Impulse  Abn   Head Shaking  Nystagmus   Not tested  Static Acuity   Not tested  Dynamic Acuity   Not tested    FUNCTIONAL GAIT: Dynamic Gait Index: 23/24  FUNCTIONAL OUTCOME MEASURES:  Results Comments  DHI  16/100 Low perception of handicap  ABC Scale 67.5% Falls risk; in need of intervention  DGI 23/24 Normal safe for community mobility  FOTO 64/100 Given the patient's risk adjustment variables, like-patients nationally had a FS score of  56/100 at intake  Dizziness Positional Status (DPS) 66.7 Scale 39-70    PATIENT EDUCATION: Education details: Discussed goals and plan of care Person educated: Patient Education method: Explanation Education comprehension: verbalized understanding  GOALS: Goals reviewed with patient? Yes  SHORT TERM GOALS: Target date: 08/01/2022  Pt will be independent with HEP in order to improve balance in order to decrease fall risk and improve function at home and in the community. Baseline:  Goal status: INITIAL  LONG TERM GOALS: Target date: 09/12/2022  Patient will have improved FOTO score of 5 points or greater in order to demonstrate improvements in patient's ADLs and functional performance.  Baseline: 64/100 on 06/20/2022 Goal status: INITIAL  2.  Patient will report 50% or greater improvement in their symptoms of imbalance with transitional movements. Baseline: pt reports unsteadiness when she stands up Goal status: INITIAL  3.  Patient will reduce falls risk as indicated by Activities Specific Balance Confidence Scale (ABC) >75%.      Baseline: 67.5% on 06/20/22 Goal status: INITIAL  4.  Patient will report that she has been able to return to participating in her exercise classes at the [facility] in order to improve balance and decrease her fall risk. Baseline: reports she has not gone to [facility] classes in a few months Goal status: INITIAL  5. Patient will be able to maintain balance on foam surface with feet together with eyes open for 15 seconds or more with +3 or less  sway. Baseline: +4 score and less than 10 seconds for EO and EC on foam surface Goal status: INITIAL ASSESSMENT:  CLINICAL IMPRESSION: Patient is a 88 y.o. female who was seen today for physical therapy evaluation and treatment for decreased balance, mobility, strength and sensation. Pt with negative bilateral horizontal and posterior canal testing this date as no nystagmus was observed and patient denied dizziness and vertigo. Pt reports that she is not have vertigo or dizziness but is having sensation of imbalance/unsteadiness upon standing up at times. Pt had loss of balance in less than 10 seconds with feet together eyes open and eyes closed on foam surface on the modified CTSIB. Pt scored 67% on the ABC scale indicating mild falls risk. Pt scored 23/24 on the DGI indicating safe for community mobility. Pt reports that she is concerned about falls and she has not been able to participate in her community exercise classes for the past few months due to sensation of burning in bilateral lower legs and unsteadiness upon standing up. Pt would benefit from skilled PT services in order to work on functional deficits and goals.    CLINICAL IMPRESSION: Pt is a pleasant year-old female/female referred for difficulty with balance. PT examination reveals deficits . Pt presents with deficits in strength, gait and balance. Pt will benefit from skilled PT services to address deficits in balance and decrease risk for future falls.   OBJECTIVE IMPAIRMENTS decreased balance, difficulty walking, decreased strength, dizziness, impaired sensation, and pain.   ACTIVITY LIMITATIONS community activity and exercise classes at [facility] .   PERSONAL FACTORS Age, Past/current experiences, and 3+ comorbidities: Hypertension, hypertriglyceridemia, peripheral neuropathy  are also affecting patient's functional outcome.   REHAB POTENTIAL: Good  CLINICAL DECISION MAKING: Evolving/moderate complexity  EVALUATION COMPLEXITY:  Moderate   PLAN:  PT FREQUENCY: 1x/week  PT DURATION: 12 weeks  PLANNED INTERVENTIONS: Therapeutic exercises, Therapeutic activity, Neuromuscular re-education, Balance training, Gait training, Patient/Family education, Stair training, Vestibular training, and Canalith repositioning  PLAN FOR NEXT SESSION: Issue exercises for home exercise program; work on standing balance exercises on firm and foam.  Mardelle Matte PT, DPT [phone]  Mardelle Matte, PT 06/20/2022, 5:14 PM

## 2022-07-01 ENCOUNTER — Ambulatory Visit: Payer: PPO | Admitting: Physical Therapy

## 2022-07-01 DIAGNOSIS — R2681 Unsteadiness on feet: Secondary | ICD-10-CM

## 2022-07-01 DIAGNOSIS — R42 Dizziness and giddiness: Secondary | ICD-10-CM | POA: Diagnosis not present

## 2022-07-01 NOTE — Therapy (Incomplete)
OUTPATIENT PHYSICAL THERAPY VESTIBULAR TREATMENT NOTE     Patient Name: Kelly Clark MRN: [id number] DOB:1929-10-10, 88 y.o., female Today's Date: 07/05/2022  PCP: [name]., MD REFERRING PROVIDER: Beverly Gust, MD   PT End of Session - 07/05/22 1645     Visit Number 2    Number of Visits 13    Date for PT Re-Evaluation 09/12/22    PT Start Time 1057    PT Stop Time 1146    PT Time Calculation (min) 49 min    Equipment Utilized During Treatment Gait belt    Activity Tolerance Patient tolerated treatment well    Behavior During Therapy [facility] for tasks assessed/performed              Past Medical History:  Diagnosis Date   Basal cell carcinoma 2019   RIGHT THUMB REGION   Breast cancer ([place]) 06/27/2018   RIGHT   Cancer ([place]) 1977   melanoma . SURGERY RIGHT ARM   Hemorrhoids    Hypertension    Hypertriglyceridemia    Insomnia    Menopausal disorder    Neutropenia (HCC)    Paresthesia    Peripheral neuropathy    Tinea corporis    Vitamin D deficiency    Past Surgical History:  Procedure Laterality Date   ABDOMINAL HYSTERECTOMY  1977   BREAST BIOPSY Bilateral 1977   benign   BREAST BIOPSY Right 06/27/2018   INVASIVE MAMMARY CARCINOMA, NO SPECIAL TYPE. 2 oclock ER/PR negative HER2 positive   BREAST EXCISIONAL BIOPSY Right 07/23/2018   lumpectomy   BREAST LUMPECTOMY Right 2019   [facility]   BREAST LUMPECTOMY WITH SENTINEL LYMPH NODE BIOPSY Right 07/23/2018   6 mm ER/PR negative, Her 2 neu 3+; node negative.  Surgeon: Robert Bellow, MD;  Location: [facility] ORS;  Service: General;  Laterality: Right;   COLONOSCOPY  2007   ELECTROCARDIOGRAM     EYE SURGERY Bilateral    CATARACT EXTRACTIONS   Patient Active Problem List   Diagnosis Date Noted   Lichen sclerosus 37/85/8850   Actinic keratosis 09/21/2018   Malignant neoplasm of upper-inner quadrant of right breast in female, estrogen receptor negative ([place]) 07/11/2018   Benign essential HTN  09/08/2017   Adult BMI 30+ 07/06/2015   Hemorrhoid 07/06/2015   Disorder of peripheral nervous system 07/06/2015   Avitaminosis D 07/06/2015   Vaginal atrophy 07/06/2015   Abnormal LFTs 08/21/2009   Cannot sleep 05/23/2000   Hypertriglyceridemia 05/19/2000    ONSET DATE: Approximately 3 months ago  REFERRING DIAG: Left BPPV   Rationale for Evaluation and Treatment Rehabilitation  THERAPY DIAG:  Dizziness and giddiness  Unsteadiness on feet  SUBJECTIVE:   SUBJECTIVE STATEMENT: Patient's chief concern is that she veers to the right and backwards at times and is off balance.  Patient reports that when she first gets up with transitional movements she gets unsteadiness.  Patient states that she can stoop and bend without difficulty and that once she gets stabilized upon standing she can walk without difficulty.  Patient reports "I can walk without any trouble; I can walk good and fast".    Pt accompanied by: self  PERTINENT HISTORY: Patient has been seen in the clinic in 2018 due to dizziness and potential BPPV.  Patient reports that the difficulties that she is experiencing now are different from her prior episode. Patient's chief concern is that she veers to the right and backwards at times and is off balance.  Patient reports that when  she first gets up with transitional movements she gets unsteadiness.  Patient states that she can stoop and bend without difficulty and that once she gets stabilized upon standing she can walk without difficulty.  Patient reports "I can walk without any trouble; I can walk good and fast".  Patient denies vertigo and dizziness, and reports unsteadiness and staggering.  Patient reports she saw Dr. Tami Ribas and had two Epley maneuvers performed on 2 separate occasions in the ENT office. 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  Patient reports that up until a few months ago she was going to the [facility] on Tuesdays and Thursdays and participating  in a cardio type class and yoga class.  Patient states that she began to have burning sensation in her feet and legs which patient reports she believes is neuropathy.  Patient reports that "the pads" behind her toes seem to be inflamed and that she is going to see her primary care doctor on August 16 at the [place] [facility] in regards to these symptoms.  Patient reports that she took some time off for the past few months from her exercise classes due to her imbalance and burning sensation in her legs.  Patient reports that she lives alone and she is concerned about falling. Dr. Tami Ribas told her to carry the cane with her. Pt states she does not really need it and "90% of the time it is not with me". Pt states otherwise she feels great and does not have any other issues or pains besides the feet. Pt states she never goes barefooted even in the house and she wears good shoes with arch support and non-skid soles. Pt states she does not wear flip flops or bedrooms shoes.  Patient reports that she drove herself to the clinic this date.  SYMPTOM BEHAVIOR: Non-Vestibular symptoms:  denies Type of dizziness: Imbalance (Disequilibrium) and Unsteady with head/body turns Frequency: Daily  Duration: Transient imbalance upon standing   Aggravating factors:  Standing up Relieving factors:  Nothing Progression of symptoms: better History of similar episodes: No prior episodes of imbalance with transitional movements. Description of dizziness: unsteadiness Auditory complaints (tinnitus, pain, drainage): denies Vision (last eye exam, diplopia, recent changes): pt wears glasses, she just had an eye exam and they said everything was great.  Current Symptoms: (dysarthria, dysphagia, drop attacks, bowel and bladder changes, recent weight loss/gain)    Review of systems negative for red flags.      DIAGNOSTIC FINDINGS:  PAIN:  Are you having pain? No  PRECAUTIONS: None   PATIENT GOALS; pt would like to not have  falls and would like to not have episodes of imbalance when she stands up.  Patient would like to be able to resume her yoga classes because she likes the stretching component.    OBJECTIVE:  Today's Treatment: 07/01/2022:  Airex pad:  On firm surface and then on Airex pad, patient performed feet together progressions and semi-tandem progressions with alternating lead leg with and without horizontal and vertical head turns.   Slow Marching: Patient performed on firm surface, slow marching with contact-guard assistance.      PATIENT EDUCATION: Education details: Discussed goals and plan of care Person educated: Patient Education method: Explanation Education comprehension: verbalized understanding  Access Code: [email or web address] URL: [email or web address] Date: 07/01/2022 Prepared by: Lady Deutscher  Exercises - Standing Marching  - 1 x daily - 7 x weekly - 2 sets - 10 reps - 3 seconds hold - Half Tandem Stance Balance  with Head Rotation  - 1 x daily - 7 x weekly - 1 sets - 10 reps - 30  to 60 seconds hold - Half Tandem Stance Balance with Head Nods  - 1 x daily - 7 x weekly - 1 sets - 10 reps - 30-60 seconds hold - 5 breathe - Feet Together with Vertical Head turns  - 1 x daily - 7 x weekly - 1 sets - 10 reps - 30-60 seconds hold GOALS: Goals reviewed with patient? Yes  SHORT TERM GOALS: Target date: 08/01/2022  Pt will be independent with HEP in order to improve balance in order to decrease fall risk and improve function at home and in the community. Baseline:  Goal status: INITIAL  LONG TERM GOALS: Target date: 09/12/2022  Patient will have improved FOTO score of 5 points or greater in order to demonstrate improvements in patient's ADLs and functional performance.  Baseline: 64/100 on 06/20/2022 Goal status: INITIAL  2.  Patient will report 50% or greater improvement in their symptoms of imbalance with transitional movements. Baseline: pt reports unsteadiness when  she stands up Goal status: INITIAL  3.  Patient will reduce falls risk as indicated by Activities Specific Balance Confidence Scale (ABC) >75%.      Baseline: 67.5% on 06/20/22 Goal status: INITIAL  4.  Patient will report that she has been able to return to participating in her exercise classes at the [facility] in order to improve balance and decrease her fall risk. Baseline: reports she has not gone to [facility] classes in a few months Goal status: INITIAL  5. Patient will be able to maintain balance on foam surface with feet together with eyes open for 15 seconds or more with +3 or less sway. Baseline: +4 score and less than 10 seconds for EO and EC on foam surface Goal status: INITIAL ASSESSMENT:  CLINICAL IMPRESSION: *** Patient is a 88 y.o. female who was seen today for physical therapy evaluation and treatment for decreased balance, mobility, strength and sensation. Pt with negative bilateral horizontal and posterior canal testing this date as no nystagmus was observed and patient denied dizziness and vertigo. Pt reports that she is not have vertigo or dizziness but is having sensation of imbalance/unsteadiness upon standing up at times. Pt had loss of balance in less than 10 seconds with feet together eyes open and eyes closed on foam surface on the modified CTSIB. Pt scored 67% on the ABC scale indicating mild falls risk. Pt scored 23/24 on the DGI indicating safe for community mobility. Pt reports that she is concerned about falls and she has not been able to participate in her community exercise classes for the past few months due to sensation of burning in bilateral lower legs and unsteadiness upon standing up. Pt would benefit from skilled PT services in order to work on functional deficits and goals.    CLINICAL IMPRESSION: Pt is a pleasant year-old female/female referred for difficulty with balance. PT examination reveals deficits . Pt presents with deficits in strength, gait and balance. Pt  will benefit from skilled PT services to address deficits in balance and decrease risk for future falls.   OBJECTIVE IMPAIRMENTS decreased balance, difficulty walking, decreased strength, dizziness, impaired sensation, and pain.   ACTIVITY LIMITATIONS community activity and exercise classes at [facility] .   PERSONAL FACTORS Age, Past/current experiences, and 3+ comorbidities: Hypertension, hypertriglyceridemia, peripheral neuropathy  are also affecting patient's functional outcome.   REHAB POTENTIAL: Good  CLINICAL DECISION MAKING: Evolving/moderate complexity  EVALUATION COMPLEXITY: Moderate   PLAN: PT FREQUENCY: 1x/week  PT DURATION: 12 weeks  PLANNED INTERVENTIONS: Therapeutic exercises, Therapeutic activity, Neuromuscular re-education, Balance training, Gait training, Patient/Family education, Stair training, Vestibular training, and Canalith repositioning  PLAN FOR NEXT SESSION: Review home exercise program; work on standing balance exercises on foam and step taps.  Lady Deutscher PT, DPT [phone]  Lady Deutscher, PT 07/05/2022, 4:46 PM

## 2022-07-06 ENCOUNTER — Ambulatory Visit: Payer: PPO | Admitting: Physical Therapy

## 2022-07-06 DIAGNOSIS — R42 Dizziness and giddiness: Secondary | ICD-10-CM | POA: Diagnosis not present

## 2022-07-06 DIAGNOSIS — R2681 Unsteadiness on feet: Secondary | ICD-10-CM

## 2022-07-06 NOTE — Therapy (Incomplete)
OUTPATIENT PHYSICAL THERAPY VESTIBULAR TREATMENT NOTE     Patient Name: Kelly Clark MRN: [id number] DOB:1929/02/01, 88 y.o., female Today's Date: 07/06/2022  PCP: [name]., MD REFERRING PROVIDER: Beverly Gust, MD   PT End of Session - 07/06/22 1353     Visit Number 3    Number of Visits 13    Date for PT Re-Evaluation 09/12/22    PT Start Time 1350    PT Stop Time 1434    PT Time Calculation (min) 44 min    Equipment Utilized During Treatment Gait belt    Activity Tolerance Patient tolerated treatment well    Behavior During Therapy [facility] for tasks assessed/performed               Past Medical History:  Diagnosis Date   Basal cell carcinoma 2019   RIGHT THUMB REGION   Breast cancer ([place]) 06/27/2018   RIGHT   Cancer ([place]) 1977   melanoma . SURGERY RIGHT ARM   Hemorrhoids    Hypertension    Hypertriglyceridemia    Insomnia    Menopausal disorder    Neutropenia (HCC)    Paresthesia    Peripheral neuropathy    Tinea corporis    Vitamin D deficiency    Past Surgical History:  Procedure Laterality Date   ABDOMINAL HYSTERECTOMY  1977   BREAST BIOPSY Bilateral 1977   benign   BREAST BIOPSY Right 06/27/2018   INVASIVE MAMMARY CARCINOMA, NO SPECIAL TYPE. 2 oclock ER/PR negative HER2 positive   BREAST EXCISIONAL BIOPSY Right 07/23/2018   lumpectomy   BREAST LUMPECTOMY Right 2019   [facility]   BREAST LUMPECTOMY WITH SENTINEL LYMPH NODE BIOPSY Right 07/23/2018   6 mm ER/PR negative, Her 2 neu 3+; node negative.  Surgeon: Robert Bellow, MD;  Location: [facility] ORS;  Service: General;  Laterality: Right;   COLONOSCOPY  2007   ELECTROCARDIOGRAM     EYE SURGERY Bilateral    CATARACT EXTRACTIONS   Patient Active Problem List   Diagnosis Date Noted   Lichen sclerosus 29/52/8413   Actinic keratosis 09/21/2018   Malignant neoplasm of upper-inner quadrant of right breast in female, estrogen receptor negative ([place]) 07/11/2018   Benign essential HTN  09/08/2017   Adult BMI 30+ 07/06/2015   Hemorrhoid 07/06/2015   Disorder of peripheral nervous system 07/06/2015   Avitaminosis D 07/06/2015   Vaginal atrophy 07/06/2015   Abnormal LFTs 08/21/2009   Cannot sleep 05/23/2000   Hypertriglyceridemia 05/19/2000    ONSET DATE: Approximately 3 months ago  REFERRING DIAG: Left BPPV   Rationale for Evaluation and Treatment Rehabilitation  THERAPY DIAG:  Dizziness and giddiness  Unsteadiness on feet  SUBJECTIVE:  Pt accompanied by: self  PERTINENT HISTORY: Patient has been seen in the clinic in 2018 due to dizziness and potential BPPV.  Patient reports that the difficulties that she is experiencing now are different from her prior episode. Patient's chief concern is that she veers to the right and backwards at times and is off balance.  Patient reports that when she first gets up with transitional movements she gets unsteadiness.  Patient states that she can stoop and bend without difficulty and that once she gets stabilized upon standing she can walk without difficulty.  Patient reports "I can walk without any trouble; I can walk good and fast".  Patient denies vertigo and dizziness, and reports unsteadiness and staggering.  Patient reports she saw Dr. Tami Ribas and had two Epley maneuvers performed on 2 separate occasions  in the ENT office. 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  Patient reports that up until a few months ago she was going to the [facility] on Tuesdays and Thursdays and participating in a cardio type class and yoga class.  Patient states that she began to have burning sensation in her feet and legs which patient reports she believes is neuropathy.  Patient reports that "the pads" behind her toes seem to be inflamed and that she is going to see her primary care doctor on August 16 at the [place] clinic in regards to these symptoms.  Patient reports that she took some time off for the past few months from her exercise  classes due to her imbalance and burning sensation in her legs.  Patient reports that she lives alone and she is concerned about falling. Dr. Tami Ribas told her to carry the cane with her. Pt states she does not really need it and "90% of the time it is not with me". Pt states otherwise she feels great and does not have any other issues or pains besides the feet. Pt states she never goes barefooted even in the house and she wears good shoes with arch support and non-skid soles. Pt states she does not wear flip flops or bedrooms shoes.  Patient reports that she drove herself to the clinic this date.  SYMPTOM BEHAVIOR: Non-Vestibular symptoms:  denies Type of dizziness: Imbalance (Disequilibrium) and Unsteady with head/body turns Frequency: Daily Duration: Transient imbalance upon standing   Aggravating factors:  Standing up Relieving factors:  Nothing Progression of symptoms: better History of similar episodes: No prior episodes of imbalance with transitional movements. Description of dizziness: unsteadiness Auditory complaints (tinnitus, pain, drainage): denies Vision (last eye exam, diplopia, recent changes): pt wears glasses, she just had an eye exam and they said everything was great.  Current Symptoms: (dysarthria, dysphagia, drop attacks, bowel and bladder changes, recent weight loss/gain)    Review of systems negative for red flags.      DIAGNOSTIC FINDINGS:  PAIN:  Are you having pain? No  PRECAUTIONS: None   PATIENT GOALS; Pt would like to not have falls and would like to not have episodes of imbalance when she stands up.  Patient would like to be able to resume her yoga classes because she likes the stretching component.    SUBJECTIVE STATEMENT: Pt states "my neuropathy has reared it's ugly head" and pt states the last two days she has burning sensation in her feet to just above the ankle. Pt states she has still tried to do her exercises. Pt states she was so excited because she  noticed that after doing the exercises at home she noticed her walking has been straighter.   OBJECTIVE:  TODAY'S TREATMENT:  07/06/2022:  Airex pad:  On firm surface and then on Airex pad, patient performed feet together progressions with horizontal and vertical head turns. On foam, feet were about 2" apart.  On Airex pad, patient performed semi-tandem progressions with alternating lead leg with and without horizontal and vertical head turns. Performed multiple 30-60 second reps with contact guard assistance to [place].    Slow Marching: Patient performed on firm surface, slow marching with contact-guard assistance with faded upper extremity support. 1 set of 10 reps each leg with 3 second holds appears to have more difficulty standing on right leg SLS stance as compared with left.    .Balltaps 20 reps alternating step taps on blue soccer ball with faded UEs  progressing to no UEs support.  Ambulation with head turns:  Patient performed 74' trials of forwards and retro ambulation with scanning for targets with min A for 1 loss of balance with retro.   P   07/01/2022: Airex pad:  On firm surface and then on Airex pad, patient performed feet together progressions and semi-tandem progressions with alternating lead leg with and without horizontal and vertical head turns. Performed multiple 30-60 second reps with contact guard assistance to [place].  Issued for home exercise program.   Slow Marching: Patient performed on firm surface, slow marching with contact-guard assistance with faded upper extremity support.  Discussed safety precautions with performing HEP at home. Demonstrated and discussed standing in corner with chair in front for safety and then patient demonstrated.  Patient educated throughout session about proper positioning and technique with exercises.  PATIENT EDUCATION: Education details: Issued HEP via [place]; discussed safety and performing HEP standing in corner  with chair in front for safety.  Person educated: Patient Education method: Explanation; verbal cues; handout Education comprehension: verbalized understanding, demonstrates understanding  Access Code: [email or web address] URL: [email or web address] Date: 07/06/2022 Prepared by: Lady Deutscher  Exercises - Standing Marching  - 1 x daily - 7 x weekly - 2 sets - 10 reps - 3 seconds hold - Half Tandem Stance Balance with Head Rotation  - 1 x daily - 7 x weekly - 1 sets - 10 reps - 30  to 60 seconds hold - Half Tandem Stance Balance with Head Nods  - 1 x daily - 7 x weekly - 1 sets - 10 reps - 30-60 seconds hold - 5 breathe - Feet Together with Vertical Head turns  - 1 x daily - 7 x weekly - 1 sets - 10 reps - 30-60 seconds hold - Sit to Stand  - 1 x daily - 7 x weekly - 2 sets - 15 reps  GOALS: Goals reviewed with patient? Yes  SHORT TERM GOALS: Target date: 08/01/2022  Pt will be independent with HEP in order to improve balance in order to decrease fall risk and improve function at home and in the community. Baseline:  Goal status: INITIAL  LONG TERM GOALS: Target date: 09/12/2022  Patient will have improved FOTO score of 5 points or greater in order to demonstrate improvements in patient's ADLs and functional performance.  Baseline: 64/100 on 06/20/2022 Goal status: INITIAL  2.  Patient will report 50% or greater improvement in their symptoms of imbalance with transitional movements. Baseline: pt reports unsteadiness when she stands up Goal status: INITIAL  3.  Patient will reduce falls risk as indicated by Activities Specific Balance Confidence Scale (ABC) >75%.      Baseline: 67.5% on 06/20/22 Goal status: INITIAL  4.  Patient will report that she has been able to return to participating in her exercise classes at the [facility] in order to improve balance and decrease her fall risk. Baseline: reports she has not gone to [facility] classes in a few months Goal status: INITIAL  5. Patient  will be able to maintain balance on foam surface with feet together with eyes open for 15 seconds or more with +3 or less sway. Baseline: +4 score and less than 10 seconds for EO and EC on foam surface Goal status: INITIAL ASSESSMENT:  CLINICAL IMPRESSION: *** Patient worked on high level standing balance activities this date. Pt was challenged by all balance activities this date especially on foam Airex pad. Patient issued standing balance exercises for home  exercise program. Patient would benefit from skilled PT services in order to address goals and to try to reduce patient's falls risk.    OBJECTIVE IMPAIRMENTS decreased balance, difficulty walking, decreased strength, dizziness, impaired sensation, and pain.   ACTIVITY LIMITATIONS community activity and exercise classes at [facility] .   PERSONAL FACTORS Age, Past/current experiences, and 3+ comorbidities: Hypertension, hypertriglyceridemia, peripheral neuropathy  are also affecting patient's functional outcome.   REHAB POTENTIAL: Good  CLINICAL DECISION MAKING: Evolving/moderate complexity  EVALUATION COMPLEXITY: Moderate   PLAN: PT FREQUENCY: 1x/week  PT DURATION: 12 weeks  PLANNED INTERVENTIONS: Therapeutic exercises, Therapeutic activity, Neuromuscular re-education, Balance training, Gait training, Patient/Family education, Stair training, Vestibular training, and Canalith repositioning  PLAN FOR NEXT SESSION: ***     Review home exercise program; work on standing balance exercises on foam, step taps and sit to stands.  Lady Deutscher PT, DPT [phone]  Lady Deutscher, PT 07/06/2022, 2:35 PM

## 2022-07-08 ENCOUNTER — Encounter: Payer: Self-pay | Admitting: Physical Therapy

## 2022-07-19 ENCOUNTER — Ambulatory Visit: Payer: PPO | Attending: Unknown Physician Specialty | Admitting: Physical Therapy

## 2022-07-19 ENCOUNTER — Encounter: Payer: Self-pay | Admitting: Physical Therapy

## 2022-07-19 DIAGNOSIS — R42 Dizziness and giddiness: Secondary | ICD-10-CM | POA: Diagnosis not present

## 2022-07-19 DIAGNOSIS — R2681 Unsteadiness on feet: Secondary | ICD-10-CM | POA: Insufficient documentation

## 2022-07-19 NOTE — Therapy (Signed)
OUTPATIENT PHYSICAL THERAPY VESTIBULAR TREATMENT NOTE     Patient Name: Kelly Clark MRN: [id number] DOB:[birth year], 88 y.o., female Today's Date: 07/19/2022  PCP: [name]., MD REFERRING PROVIDER: Beverly Gust, MD   PT End of Session - 07/19/22 1058     Visit Number 4    Number of Visits 13    Date for PT Re-Evaluation 09/12/22    PT Start Time 1059    PT Stop Time 1144    PT Time Calculation (min) 45 min    Equipment Utilized During Treatment Gait belt    Activity Tolerance Patient tolerated treatment well    Behavior During Therapy [facility] for tasks assessed/performed             Past Medical History:  Diagnosis Date   Basal cell carcinoma 2019   RIGHT THUMB REGION   Breast cancer ([place]) 06/27/2018   RIGHT   Cancer ([place]) 1977   melanoma . SURGERY RIGHT ARM   Hemorrhoids    Hypertension    Hypertriglyceridemia    Insomnia    Menopausal disorder    Neutropenia (HCC)    Paresthesia    Peripheral neuropathy    Tinea corporis    Vitamin D deficiency    Past Surgical History:  Procedure Laterality Date   ABDOMINAL HYSTERECTOMY  1977   BREAST BIOPSY Bilateral 1977   benign   BREAST BIOPSY Right 06/27/2018   INVASIVE MAMMARY CARCINOMA, NO SPECIAL TYPE. 2 oclock ER/PR negative HER2 positive   BREAST EXCISIONAL BIOPSY Right 07/23/2018   lumpectomy   BREAST LUMPECTOMY Right 2019   [facility]   BREAST LUMPECTOMY WITH SENTINEL LYMPH NODE BIOPSY Right 07/23/2018   6 mm ER/PR negative, Her 2 neu 3+; node negative.  Surgeon: Robert Bellow, MD;  Location: [facility] ORS;  Service: General;  Laterality: Right;   COLONOSCOPY  2007   ELECTROCARDIOGRAM     EYE SURGERY Bilateral    CATARACT EXTRACTIONS   Patient Active Problem List   Diagnosis Date Noted   Lichen sclerosus 94/50/3888   Actinic keratosis 09/21/2018   Malignant neoplasm of upper-inner quadrant of right breast in female, estrogen receptor negative ([place]) 07/11/2018   Benign essential HTN 09/08/2017    Adult BMI 30+ 07/06/2015   Hemorrhoid 07/06/2015   Disorder of peripheral nervous system 07/06/2015   Avitaminosis D 07/06/2015   Vaginal atrophy 07/06/2015   Abnormal LFTs 08/21/2009   Cannot sleep 05/23/2000   Hypertriglyceridemia 05/19/2000    ONSET DATE: Approximately 3 months ago  REFERRING DIAG: Left BPPV   Rationale for Evaluation and Treatment Rehabilitation  THERAPY DIAG:  Dizziness and giddiness  Unsteadiness on feet  SUBJECTIVE:  Pt accompanied by: self  PERTINENT HISTORY: Patient has been seen in the clinic in 2018 due to dizziness and potential BPPV.  Patient reports that the difficulties that she is experiencing now are different from her prior episode. Patient's chief concern is that she veers to the right and backwards at times and is off balance.  Patient reports that when she first gets up with transitional movements she gets unsteadiness.  Patient states that she can stoop and bend without difficulty and that once she gets stabilized upon standing she can walk without difficulty.  Patient reports "I can walk without any trouble; I can walk good and fast".  Patient denies vertigo and dizziness, and reports unsteadiness and staggering.  Patient reports she saw Dr. Tami Ribas and had two E[place]y maneuvers performed on 2 separate occasions in the  ENT office. 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  Patient reports that up until a few months ago she was going to the [facility] on Tuesdays and Thursdays and participating in a cardio type class and yoga class.  Patient states that she began to have burning sensation in her feet and legs which patient reports she believes is neuropathy.  Patient reports that "the pads" behind her toes seem to be inflamed and that she is going to see her primary care doctor on August 16 at the [place] [facility] in regards to these symptoms.  Patient reports that she took some time off for the past few months from her exercise classes due to  her imbalance and burning sensation in her legs.  Patient reports that she lives alone and she is concerned about falling. Dr. Tami Ribas told her to carry the cane with her. Pt states she does not really need it and "90% of the time it is not with me". Pt states otherwise she feels great and does not have any other issues or pains besides the feet. Pt states she never goes barefooted even in the house and she wears good shoes with arch support and non-skid soles. Pt states she does not wear flip flops or bedrooms shoes.  Patient reports that she drove herself to the clinic this date.  SYMPTOM BEHAVIOR: Non-Vestibular symptoms:  denies Type of dizziness: Imbalance (Disequilibrium) and Unsteady with head/body turns Frequency: Daily Duration: Transient imbalance upon standing   Aggravating factors:  Standing up Relieving factors:  Nothing Progression of symptoms: better History of similar episodes: No prior episodes of imbalance with transitional movements. Description of dizziness: unsteadiness Auditory complaints (tinnitus, pain, drainage): denies Vision (last eye exam, diplopia, recent changes): pt wears glasses, she just had an eye exam and they said everything was great.  Current Symptoms: (dysarthria, dysphagia, drop attacks, bowel and bladder changes, recent weight loss/gain)    Review of systems negative for red flags.      DIAGNOSTIC FINDINGS:  PAIN:  Are you having pain? No  PRECAUTIONS: None   PATIENT GOALS; Pt would like to not have falls and would like to not have episodes of imbalance when she stands up.  Patient would like to be able to resume her yoga classes because she likes the stretching component.    SUBJECTIVE STATEMENT: Pt states that she has been practicing her exercises standing on the firm ground and that has been going well. Pt states she has been trying modified heel to mid arch walking. Pt states she is walking better and states she is excited.    OBJECTIVE:  TODAY'S TREATMENT:  07/19/2022:  Airex pad:  On firm surface and then on Airex pad, patient performed feet together progressions and semi-tandem progressions with alternating lead leg with and without horizontal and vertical head turns with CGA and verbal cues for foot placement.   Balance Pod tapping: On firm surface, patient performed foot tapping to balance pods in series of one, two and then three targets as called out by therapist. Patient occasionally touching // bar for support due to losses of balance. In standing on purple foam pad, patient performed balance pod tapping in series of one and two cones as called out by therapist with CGA.  Patient with multiple small losses of balance where she needed to reach // bars for support or take a wide step to regain balance.   Patient requiring assistance ranging from CGA to [place] A at times  due to loss of balance.   Airex balance beam: On Airex balance beam, patient performed sidestepping left/right without head turns and then with horizontal head turns about 8 reps times 5' each with CGA.   Ambulation with head turns:  Patient performed 100' trials of forwards ambulation with horizontal and vertical head turns with CGA.  Patient demonstrates   Diona Foley pass over shoulder: Patient performed multiple 100' trials of forward  ambulation while passing ball over one shoulder with return catch over opposite shoulder with CGA to Mod A.  Patient had one large loss of balance requiring increased assistance to correct.  Patient reports that this activity recreates the sensation that she gets of feeling off balance and veering to the right when walking at times.  Education provided throughout session via verbal cues and demonstration to facilitate movement at target joints for exercises performed.   PATIENT EDUCATION: Education details: Stand exercise for home exercise program via [place]; reviewed safety and performing HEP standing in  corner with chair in front for safety.  Person educated: Patient Education method: Explanation; verbal cues; handout Education comprehension: verbalized understanding, demonstrates understanding  Access Code: [email or web address] URL: [email or web address] Date: 07/06/2022 Prepared by: Lady Deutscher  Exercises - Standing Marching  - 1 x daily - 7 x weekly - 2 sets - 10 reps - 3 seconds hold - Half Tandem Stance Balance with Head Rotation  - 1 x daily - 7 x weekly - 1 sets - 10 reps - 30  to 60 seconds hold - Half Tandem Stance Balance with Head Nods  - 1 x daily - 7 x weekly - 1 sets - 10 reps - 30-60 seconds hold - 5 breathe - Feet Together with Vertical Head turns  - 1 x daily - 7 x weekly - 1 sets - 10 reps - 30-60 seconds hold - Sit to Stand  - 1 x daily - 7 x weekly - 2 sets - 15 reps  GOALS: Goals reviewed with patient? Yes  SHORT TERM GOALS: Target date: 08/01/2022  Pt will be independent with HEP in order to improve balance in order to decrease fall risk and improve function at home and in the community. Baseline:  Goal status: INITIAL  LONG TERM GOALS: Target date: 09/12/2022  Patient will have improved FOTO score of 5 points or greater in order to demonstrate improvements in patient's ADLs and functional performance.  Baseline: 64/100 on 06/20/2022 Goal status: INITIAL  2.  Patient will report 50% or greater improvement in their symptoms of imbalance with transitional movements. Baseline: pt reports unsteadiness when she stands up Goal status: INITIAL  3.  Patient will reduce falls risk as indicated by Activities Specific Balance Confidence Scale (ABC) >75%.      Baseline: 67.5% on 06/20/22 Goal status: INITIAL  4.  Patient will report that she has been able to return to participating in her exercise classes at the [facility] in order to improve balance and decrease her fall risk. Baseline: reports she has not gone to [facility] classes in a few months Goal status: INITIAL  5.  Patient will be able to maintain balance on foam surface with feet together with eyes open for 15 seconds or more with +3 or less sway. Baseline: +4 score and less than 10 seconds for EO and EC on foam surface Goal status: INITIAL ASSESSMENT:  CLINICAL IMPRESSION: Patient reports continued compliance with home exercise program. Patient improved with practice balance pod stepping. Patient challenged by ambulation with ball toss activities  especially ball pass over shoulder. Pt reports ball pass over the shoulder while ambulating recreates the sensation that she gets when she walks and veers right at times. Patient would benefit from continued PT services to further address goals and to try to reduce patient's falls risk.   OBJECTIVE IMPAIRMENTS decreased balance, difficulty walking, decreased strength, dizziness, impaired sensation, and pain.   ACTIVITY LIMITATIONS community activity and exercise classes at [facility] .   PERSONAL FACTORS Age, Past/current experiences, and 3+ comorbidities: Hypertension, hypertriglyceridemia, peripheral neuropathy  are also affecting patient's functional outcome.   REHAB POTENTIAL: Good  CLINICAL DECISION MAKING: Evolving/moderate complexity  EVALUATION COMPLEXITY: Moderate   PLAN: PT FREQUENCY: 1x/week  PT DURATION: 12 weeks  PLANNED INTERVENTIONS: Therapeutic exercises, Therapeutic activity, Neuromuscular re-education, Balance training, Gait training, Patient/Family education, Stair training, Vestibular training, and Canalith repositioning  PLAN FOR NEXT SESSION: Work on Airex balance beam, ambulation with ball toss over shoulder, stationary ball toss over shoulder at varying heights; try cone tapping. Update STGs next session.  Lady Deutscher PT, DPT [phone]  Lady Deutscher, PT 07/19/2022, 1:37 PM

## 2022-07-25 ENCOUNTER — Ambulatory Visit: Payer: PPO | Admitting: Physical Therapy

## 2022-07-25 ENCOUNTER — Encounter: Payer: Self-pay | Admitting: Physical Therapy

## 2022-07-25 DIAGNOSIS — R2681 Unsteadiness on feet: Secondary | ICD-10-CM

## 2022-07-25 DIAGNOSIS — R42 Dizziness and giddiness: Secondary | ICD-10-CM

## 2022-07-25 NOTE — Therapy (Signed)
OUTPATIENT PHYSICAL THERAPY VESTIBULAR TREATMENT NOTE     Patient Name: Kelly Clark MRN: [id number] DOB:09-04-1929, 88 y.o., female Today's Date: 07/25/2022  PCP: [name]., MD REFERRING PROVIDER: Beverly Gust, MD   PT End of Session - 07/25/22 1111     Visit Number 5    Number of Visits 13    Date for PT Re-Evaluation 09/12/22    PT Start Time 1118    PT Stop Time 1200    PT Time Calculation (min) 42 min    Equipment Utilized During Treatment Gait belt    Activity Tolerance Patient tolerated treatment well    Behavior During Therapy [place] for tasks assessed/performed              Past Medical History:  Diagnosis Date   Basal cell carcinoma 2019   RIGHT THUMB REGION   Breast cancer ([place]) 06/27/2018   RIGHT   Cancer ([place]) 1977   melanoma . SURGERY RIGHT ARM   Hemorrhoids    Hypertension    Hypertriglyceridemia    Insomnia    Menopausal disorder    Neutropenia ([facility])    Paresthesia    Peripheral neuropathy    Tinea corporis    Vitamin D deficiency    Past Surgical History:  Procedure Laterality Date   ABDOMINAL HYSTERECTOMY  1977   BREAST BIOPSY Bilateral 1977   benign   BREAST BIOPSY Right 06/27/2018   INVASIVE MAMMARY CARCINOMA, NO SPECIAL TYPE. 2 oclock ER/PR negative HER2 positive   BREAST EXCISIONAL BIOPSY Right 07/23/2018   lumpectomy   BREAST LUMPECTOMY Right 2019   [facility]   BREAST LUMPECTOMY WITH SENTINEL LYMPH NODE BIOPSY Right 07/23/2018   6 mm ER/PR negative, Her 2 neu 3+; node negative.  Surgeon: Robert Bellow, MD;  Location: [facility] ORS;  Service: General;  Laterality: Right;   COLONOSCOPY  2007   ELECTROCARDIOGRAM     EYE SURGERY Bilateral    CATARACT EXTRACTIONS   Patient Active Problem List   Diagnosis Date Noted   Lichen sclerosus 09/98/3382   Actinic keratosis 09/21/2018   Malignant neoplasm of upper-inner quadrant of right breast in female, estrogen receptor negative ([place]) 07/11/2018   Benign essential HTN  09/08/2017   Adult BMI 30+ 07/06/2015   Hemorrhoid 07/06/2015   Disorder of peripheral nervous system 07/06/2015   Avitaminosis D 07/06/2015   Vaginal atrophy 07/06/2015   Abnormal LFTs 08/21/2009   Cannot sleep 05/23/2000   Hypertriglyceridemia 05/19/2000    ONSET DATE: Approximately 3 months ago  REFERRING DIAG: Left BPPV   Rationale for Evaluation and Treatment Rehabilitation  THERAPY DIAG:  Dizziness and giddiness  Unsteadiness on feet  SUBJECTIVE:  Pt accompanied by: self  PERTINENT HISTORY: Patient has been seen in the clinic in 2018 due to dizziness and potential BPPV.  Patient reports that the difficulties that she is experiencing now are different from her prior episode. Patient's chief concern is that she veers to the right and backwards at times and is off balance.  Patient reports that when she first gets up with transitional movements she gets unsteadiness.  Patient states that she can stoop and bend without difficulty and that once she gets stabilized upon standing she can walk without difficulty.  Patient reports "I can walk without any trouble; I can walk good and fast".  Patient denies vertigo and dizziness, and reports unsteadiness and staggering.  Patient reports she saw Dr. Tami Ribas and had two Epley maneuvers performed on 2 separate occasions in  the ENT office. 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  Patient reports that up until a few months ago she was going to the [facility] on Tuesdays and Thursdays and participating in a cardio type class and yoga class.  Patient states that she began to have burning sensation in her feet and legs which patient reports she believes is neuropathy.  Patient reports that "the pads" behind her toes seem to be inflamed and that she is going to see her primary care doctor on August 16 at the [facility] in regards to these symptoms.  Patient reports that she took some time off for the past few months from her exercise  classes due to her imbalance and burning sensation in her legs.  Patient reports that she lives alone and she is concerned about falling. Dr. Tami Ribas told her to carry the cane with her. Pt states she does not really need it and "90% of the time it is not with me". Pt states otherwise she feels great and does not have any other issues or pains besides the feet. Pt states she never goes barefooted even in the house and she wears good shoes with arch support and non-skid soles. Pt states she does not wear flip flops or bedrooms shoes.  Patient reports that she drove herself to the clinic this date.  SYMPTOM BEHAVIOR: Non-Vestibular symptoms:  denies Type of dizziness: Imbalance (Disequilibrium) and Unsteady with head/body turns Frequency: Daily Duration: Transient imbalance upon standing   Aggravating factors:  Standing up Relieving factors:  Nothing Progression of symptoms: better History of similar episodes: No prior episodes of imbalance with transitional movements. Description of dizziness: unsteadiness Auditory complaints (tinnitus, pain, drainage): denies Vision (last eye exam, diplopia, recent changes): pt wears glasses, she just had an eye exam and they said everything was great.  Current Symptoms: (dysarthria, dysphagia, drop attacks, bowel and bladder changes, recent weight loss/gain)    Review of systems negative for red flags.      DIAGNOSTIC FINDINGS:  PAIN:  Are you having pain? No  PRECAUTIONS: None   PATIENT GOALS; Pt would like to not have falls and would like to not have episodes of imbalance when she stands up.  Patient would like to be able to resume her yoga classes because she likes the stretching component.    SUBJECTIVE STATEMENT: Pt states that she tried overturned cereal boxes as targets for step taps at home.  Patient reports that she has been doing her home exercise program without issues.  Patient reports that the sit to stand exercise without using her hands is  easy.  OBJECTIVE:  TODAY'S TREATMENT:  07/25/2022:  Cone Tapping: In standing on purple foam pad, patient performed cone tapping in series of one, 2 and 3 cones as called out by therapist with CGA.  Patient with multiple small losses of balance and required assistance ranging from contact-guard to minimal assistance. Note patient stood at parallel bar to do cone tapping activity and she had her arms hovering above the bar without touching for balance, but occasionally would touch bar to regain balance.  Airex balance beam: On Airex balance beam, patient performed sidestepping left/right without head turns and then with horizontal and then vertical head turns multiple reps times 5' each with CGA to minimal assistance.  Patient had 1 loss of balance requiring min assist to correct with vertical head turns. On Airex balance beam patient perform static normal base of support stance while performing body turns.  Ball pass over shoulder: Patient performed stationary ball passed over shoulder with contact-guard assistance and then added passing ball while varying the ball position to head, shoulder and below waist level to promote head turning and tilting. Patient ambulated about 75 feet while performing ball passes over shoulder at varying heights with contact-guard to minimal assistance.  Patient had 1 small loss of balance requiring min assist to correct.  Sit to Stands:  Patient performed 10 reps sit to stand from armless chair with purple foam pad under feet verbal cuing to slow eccentric descent with CGA.  Patient performed 15 repetitions with blue Airex foam under feet sit to stand without upper extremity use with contact-guard assistance.  Body Wall Rolls:  Patient performed 6 reps of unsupported, body wall rolls with eyes open.  Patient performed 6 reps of 6 supported body wall rolls with eyes closed. Patient required contact-guard assistance with this activity.   Patient denied  dizziness and did not have any increased sway upon stopping the activity. Instructed patient to NOT perform body wall rolls at home.  07/19/2022: Airex pad:  On firm surface and then on Airex pad, patient performed feet together progressions and semi-tandem progressions with alternating lead leg with and without horizontal and vertical head turns with CGA and verbal cues for foot placement.   Balance Pod tapping: On firm surface, patient performed foot tapping to balance pods in series of one, two and then three targets as called out by therapist. Patient occasionally touching // bar for support due to losses of balance. In standing on purple foam pad, patient performed balance pod tapping in series of one and two cones as called out by therapist with CGA.  Patient with multiple small losses of balance where she needed to reach // bars for support or take a wide step to regain balance.   Patient requiring assistance ranging from CGA to Min A at times due to loss of balance.   Airex balance beam: On Airex balance beam, patient performed sidestepping left/right without head turns and then with horizontal head turns about 8 reps times 5' each with CGA.   Ambulation with head turns:  Patient performed 100' trials of forwards ambulation with horizontal and vertical head turns with CGA.  Patient demonstrates   Diona Foley pass over shoulder: Patient performed multiple 100' trials of forward  ambulation while passing ball over one shoulder with return catch over opposite shoulder with CGA to Mod A.  Patient had one large loss of balance requiring increased assistance to correct.  Patient reports that this activity recreates the sensation that she gets of feeling off balance and veering to the right when walking at times.  Education provided throughout session via verbal cues and demonstration to facilitate movement at target joints for exercises performed.   PATIENT EDUCATION: Education details:  Discussed home exercise program and added progression of placing pillow under feet for sit to stand Person educated: Patient Education method: Explanation; verbal cues Education comprehension: verbalized understanding, demonstrates understanding  Access Code: [email or web address] URL: [email or web address] Date: 07/06/2022 Prepared by: Lady Deutscher  Exercises - Standing Marching  - 1 x daily - 7 x weekly - 2 sets - 10 reps - 3 seconds hold - Half Tandem Stance Balance with Head Rotation  - 1 x daily - 7 x weekly - 1 sets - 10 reps - 30  to 60 seconds hold - Half Tandem Stance Balance with Head Nods  - 1 x daily - 7 x weekly - 1  sets - 10 reps - 30-60 seconds hold - 5 breathe - Feet Together with Vertical Head turns  - 1 x daily - 7 x weekly - 1 sets - 10 reps - 30-60 seconds hold - Sit to Stand  - 1 x daily - 7 x weekly - 2 sets - 15 reps; added progression of having pillow under feet with sit to stand  GOALS: Goals reviewed with patient? Yes  SHORT TERM GOALS: Target date: 08/01/2022  Pt will be independent with HEP in order to improve balance in order to decrease fall risk and improve function at home and in the community. Baseline:  Goal status: MET  LONG TERM GOALS: Target date: 09/12/2022  Patient will have improved FOTO score of 5 points or greater in order to demonstrate improvements in patient's ADLs and functional performance.  Baseline: 64/100 on 06/20/2022 Goal status: INITIAL  2.  Patient will report 50% or greater improvement in their symptoms of imbalance with transitional movements. Baseline: pt reports unsteadiness when she stands up Goal status: INITIAL  3.  Patient will reduce falls risk as indicated by Activities Specific Balance Confidence Scale (ABC) >75%.      Baseline: 67.5% on 06/20/22 Goal status: INITIAL  4.  Patient will report that she has been able to return to participating in her exercise classes at the [facility] in order to improve balance and decrease  her fall risk. Baseline: reports she has not gone to [facility] classes in a few months Goal status: INITIAL  5. Patient will be able to maintain balance on foam surface with feet together with eyes open for 15 seconds or more with +3 or less sway. Baseline: +4 score and less than 10 seconds for EO and EC on foam surface Goal status: INITIAL ASSESSMENT:  CLINICAL IMPRESSION: Patient demonstrating good progress this date as she was able to progress to cone tapping and sequences of 1, 2 and 3 cones, and able to progress to sit to stands with purple and then Airex blue pad placed under feet.  In addition, patient denied unsteadiness and dizziness with ball pass over shoulder exercise and was able to progress to varying heights of ball pass including above the shoulder, waist, below waist.  Patient did well with body wall roll exercise without loss of balance and denied dizziness.  Patient was challenged by Airex balance beam activities, cone tapping, ball pass over shoulder exercises this date.  Patient has met short-term goal #1 of independence with home exercise program.  Patient would benefit from continued vestibular PT services to further address goals and functional deficits.  OBJECTIVE IMPAIRMENTS decreased balance, difficulty walking, decreased strength, dizziness, impaired sensation, and pain.   ACTIVITY LIMITATIONS community activity and exercise classes at [facility] .   PERSONAL FACTORS Age, Past/current experiences, and 3+ comorbidities: Hypertension, hypertriglyceridemia, peripheral neuropathy  are also affecting patient's functional outcome.   REHAB POTENTIAL: Good  CLINICAL DECISION MAKING: Evolving/moderate complexity  EVALUATION COMPLEXITY: Moderate   PLAN: PT FREQUENCY: 1x/week  PT DURATION: 12 weeks  PLANNED INTERVENTIONS: Therapeutic exercises, Therapeutic activity, Neuromuscular re-education, Balance training, Gait training, Patient/Family education, Stair training, Vestibular  training, and Canalith repositioning  PLAN FOR NEXT SESSION:  Next session will plan on progressions of Airex balance beam activities including sidestepping with vertical head turns and ball passes over shoulder with stationary standing on Airex balance pad.  Lady Deutscher PT, DPT [phone]  Lady Deutscher, PT 07/25/2022, 12:48 PM

## 2022-08-02 ENCOUNTER — Ambulatory Visit: Payer: PPO | Admitting: Physical Therapy

## 2022-08-02 ENCOUNTER — Encounter: Payer: Self-pay | Admitting: Physical Therapy

## 2022-08-02 DIAGNOSIS — R2681 Unsteadiness on feet: Secondary | ICD-10-CM

## 2022-08-02 DIAGNOSIS — R42 Dizziness and giddiness: Secondary | ICD-10-CM | POA: Diagnosis not present

## 2022-08-02 NOTE — Therapy (Signed)
OUTPATIENT PHYSICAL THERAPY VESTIBULAR TREATMENT NOTE    Patient Name: Kelly Clark MRN: [id number] DOB:1929/12/05, 88 y.o., female Today's Date: 08/02/2022  PCP: [name]., MD REFERRING PROVIDER: Beverly Gust, MD   PT End of Session - 08/02/22 1238     Visit Number 6    Number of Visits 13    Date for PT Re-Evaluation 09/12/22    PT Start Time 3300    PT Stop Time 1335    PT Time Calculation (min) 46 min    Equipment Utilized During Treatment Gait belt    Activity Tolerance Patient tolerated treatment well    Behavior During Therapy [facility] for tasks assessed/performed            Past Medical History:  Diagnosis Date   Basal cell carcinoma 2019   RIGHT THUMB REGION   Breast cancer ([place]) 06/27/2018   RIGHT   Cancer ([place]) 1977   melanoma . SURGERY RIGHT ARM   Hemorrhoids    Hypertension    Hypertriglyceridemia    Insomnia    Menopausal disorder    Neutropenia (HCC)    Paresthesia    Peripheral neuropathy    Tinea corporis    Vitamin D deficiency    Past Surgical History:  Procedure Laterality Date   ABDOMINAL HYSTERECTOMY  1977   BREAST BIOPSY Bilateral 1977   benign   BREAST BIOPSY Right 06/27/2018   INVASIVE MAMMARY CARCINOMA, NO SPECIAL TYPE. 2 oclock ER/PR negative HER2 positive   BREAST EXCISIONAL BIOPSY Right 07/23/2018   lumpectomy   BREAST LUMPECTOMY Right 2019   [facility]   BREAST LUMPECTOMY WITH SENTINEL LYMPH NODE BIOPSY Right 07/23/2018   6 mm ER/PR negative, Her 2 neu 3+; node negative.  Surgeon: Robert Bellow, MD;  Location: [facility] ORS;  Service: General;  Laterality: Right;   COLONOSCOPY  2007   ELECTROCARDIOGRAM     EYE SURGERY Bilateral    CATARACT EXTRACTIONS   Patient Active Problem List   Diagnosis Date Noted   Lichen sclerosus 76/22/6333   Actinic keratosis 09/21/2018   Malignant neoplasm of upper-inner quadrant of right breast in female, estrogen receptor negative ([place]) 07/11/2018   Benign essential HTN 09/08/2017    Adult BMI 30+ 07/06/2015   Hemorrhoid 07/06/2015   Disorder of peripheral nervous system 07/06/2015   Avitaminosis D 07/06/2015   Vaginal atrophy 07/06/2015   Abnormal LFTs 08/21/2009   Cannot sleep 05/23/2000   Hypertriglyceridemia 05/19/2000   ONSET DATE: Approximately 3 months ago  REFERRING DIAG: Left BPPV   Rationale for Evaluation and Treatment Rehabilitation  THERAPY DIAG:  Dizziness and giddiness  Unsteadiness on feet  SUBJECTIVE:  Pt accompanied by: self  PERTINENT HISTORY: Patient has been seen in the clinic in 2018 due to dizziness and potential BPPV.  Patient reports that the difficulties that she is experiencing now are different from her prior episode. Patient's chief concern is that she veers to the right and backwards at times and is off balance.  Patient reports that when she first gets up with transitional movements she gets unsteadiness.  Patient states that she can stoop and bend without difficulty and that once she gets stabilized upon standing she can walk without difficulty.  Patient reports "I can walk without any trouble; I can walk good and fast".  Patient denies vertigo and dizziness, and reports unsteadiness and staggering.  Patient reports she saw Dr. Tami Ribas and had two Epley maneuvers performed on 2 separate occasions in the ENT office. Pt  reports that she has "disequlibrium" in the left ear.  Patient reports that up until a few months ago she was going to the [facility] on Tuesdays and Thursdays and participating in a cardio type class and yoga class.  Patient states that she began to have burning sensation in her feet and legs which patient reports she believes is neuropathy.  Patient reports that "the pads" behind her toes seem to be inflamed and that she is going to see her primary care doctor on August 16 at the [place] [facility] in regards to these symptoms.  Patient reports that she took some time off for the past few months from her exercise classes due to her  imbalance and burning sensation in her legs.  Patient reports that she lives alone and she is concerned about falling. Dr. Tami Ribas told her to carry the cane with her. Pt states she does not really need it and "90% of the time it is not with me". Pt states otherwise she feels great and does not have any other issues or pains besides the feet. Pt states she never goes barefooted even in the house and she wears good shoes with arch support and non-skid soles. Pt states she does not wear flip flops or bedrooms shoes.  Patient reports that she drove herself to the clinic this date.  SYMPTOM BEHAVIOR: Non-Vestibular symptoms:  denies Type of dizziness: Imbalance (Disequilibrium) and Unsteady with head/body turns Frequency: Daily Duration: Transient imbalance upon standing   Aggravating factors:  Standing up Relieving factors:  Nothing Progression of symptoms: better History of similar episodes: No prior episodes of imbalance with transitional movements. Description of dizziness: unsteadiness Auditory complaints (tinnitus, pain, drainage): denies Vision (last eye exam, diplopia, recent changes): pt wears glasses, she just had an eye exam and they said everything was great.  Current Symptoms: (dysarthria, dysphagia, drop attacks, bowel and bladder changes, recent weight loss/gain)    Review of systems negative for red flags.      DIAGNOSTIC FINDINGS:  PAIN:  Are you having pain? No  PRECAUTIONS: None   PATIENT GOALS; Pt would like to not have falls and would like to not have episodes of imbalance when she stands up.  Patient would like to be able to resume her yoga classes because she likes the stretching component.    SUBJECTIVE STATEMENT: Patient states she is not good at standing on one leg, but states she feels this exercises is helping with her walking. Pt states she has her speed back with walking. Pt states "I am walking so much better." Pt states she is not getting that sensation of  imbalance when she first stands up anymore. Pt states when she first came in for therapy, she would stagger to the right and stagger backwards at times when she would be first walking but states she has not had any of these episodes in the past week. Pt states she has a followup appt with her Dtr. Rosanna Randy since he had changed her blood pressure. She has been monitoring her BP at home.  Patient states she sees the neuropathy doctor on Aug 29th.  OBJECTIVE:  TODAY'S TREATMENT:  08/02/2022:  Diona Foley Sorting Activity: On red foam floor mat, performed transferring multicolored balls from bin placed on floor to another bin placed 180 degrees on the other side of patient while patient visually tracks the ball so that patient is required to turn 180 degrees Left and Right to turn to bend over and pick and place balls in  the bins with CGA.  Pt with evidence of mild imbalance that patient was able to self-correct. Pt denies dizziness with this activity.   Red floor mat Activity: Worked on red floor foam mat kicking playground ball against wall with alternating kicking foot.  Patient was able to perform with CGA without loss of balance. Pt reports no dizziness.   Airex pad:  On Airex pad, patient performed feet together progressions with eyes closed multiple 30 second trials with and without horizontal and vertical head turns with CGA to Min A.  Pt denies dizziness with this activity. Noted patient had increased imbalance with eyes closed activities on foam pad.  Slow Marching: Patient performed standing on Airex pad, slow marching with 5 second holds 15 reps each leg with contact-guard assistance.  07/25/2022: Cone Tapping: In standing on purple foam pad, patient performed cone tapping in series of one, 2 and 3 cones as called out by therapist with CGA.  Patient with multiple small losses of balance and required assistance ranging from contact-guard to minimal assistance. Note patient stood at parallel  bar to do cone tapping activity and she had her arms hovering above the bar without touching for balance, but occasionally would touch bar to regain balance.  Airex balance beam: On Airex balance beam, patient performed sidestepping left/right without head turns and then with horizontal and then vertical head turns multiple reps times 5' each with CGA to minimal assistance.  Patient had 1 loss of balance requiring min assist to correct with vertical head turns. On Airex balance beam patient perform static normal base of support stance while performing body turns.  Ball pass over shoulder: Patient performed stationary ball passed over shoulder with contact-guard assistance and then added passing ball while varying the ball position to head, shoulder and below waist level to promote head turning and tilting. Patient ambulated about 75 feet while performing ball passes over shoulder at varying heights with contact-guard to minimal assistance.  Patient had 1 small loss of balance requiring min assist to correct.  Sit to Stands:  Patient performed 10 reps sit to stand from armless chair with purple foam pad under feet verbal cuing to slow eccentric descent with CGA.  Patient performed 15 repetitions with blue Airex foam under feet sit to stand without upper extremity use with contact-guard assistance.  Body Wall Rolls:  Patient performed 6 reps of unsupported, body wall rolls with eyes open.  Patient performed 6 reps of 6 supported body wall rolls with eyes closed. Patient required contact-guard assistance with this activity.   Patient denied dizziness and did not have any increased sway upon stopping the activity. Instructed patient to NOT perform body wall rolls at home.  Education provided throughout session via verbal cues and demonstration to facilitate movement at target joints for exercises performed.   PATIENT EDUCATION: Education details: Discussed home exercise program and added  progression of placing pillow under feet for sit to stand Person educated: Patient Education method: Explanation; verbal cues Education comprehension: verbalized understanding, demonstrates understanding  Access Code: [email or web address] URL: [email or web address] Date: 07/06/2022 Prepared by: Lady Deutscher  Exercises - Standing Marching  - 1 x daily - 7 x weekly - 2 sets - 10 reps - 3 seconds hold - Half Tandem Stance Balance with Head Rotation  - 1 x daily - 7 x weekly - 1 sets - 10 reps - 30  to 60 seconds hold - Half Tandem Stance Balance with Head Nods  - 1 x daily - 7  x weekly - 1 sets - 10 reps - 30-60 seconds hold - 5 breathe - Feet Together with Vertical Head turns  - 1 x daily - 7 x weekly - 1 sets - 10 reps - 30-60 seconds hold - Sit to Stand  - 1 x daily - 7 x weekly - 2 sets - 15 reps; added progression of having pillow under feet with sit to stand  GOALS: Goals reviewed with patient? Yes  SHORT TERM GOALS: Target date: 08/01/2022  Pt will be independent with HEP in order to improve balance in order to decrease fall risk and improve function at home and in the community. Baseline:  Goal status: MET  LONG TERM GOALS: Target date: 09/12/2022  Patient will have improved FOTO score of 5 points or greater in order to demonstrate improvements in patient's ADLs and functional performance.  Baseline: 64/100 on 06/20/2022 Goal status: INITIAL  2.  Patient will report 50% or greater improvement in their symptoms of imbalance with transitional movements. Baseline: pt reports unsteadiness when she stands up Goal status: INITIAL  3.  Patient will reduce falls risk as indicated by Activities Specific Balance Confidence Scale (ABC) >75%.      Baseline: 67.5% on 06/20/22 Goal status: INITIAL  4.  Patient will report that she has been able to return to participating in her exercise classes at the [facility] in order to improve balance and decrease her fall risk. Baseline: reports she has  not gone to [facility] classes in a few months Goal status: INITIAL  5. Patient will be able to maintain balance on foam surface with feet together with eyes open for 15 seconds or more with +3 or less sway. Baseline: +4 score and less than 10 seconds for EO and EC on foam surface Goal status: INITIAL ASSESSMENT:  CLINICAL IMPRESSION: Patient reports that she has not had any further episodes of staggering backwards or to the right when trying to initially walk. In addition, she reports that she is walking much better since starting therapy. Pt did well with ball sorting activity while standing on red foam floor mat without dizziness. Patient continues to be challenged by eyes closed activities on Airex pad. Patient would benefit from continued PT services to further address functional deficits and goals.   OBJECTIVE IMPAIRMENTS decreased balance, difficulty walking, decreased strength, dizziness, impaired sensation, and pain.   ACTIVITY LIMITATIONS community activity and exercise classes at [facility] .   PERSONAL FACTORS Age, Past/current experiences, and 3+ comorbidities: Hypertension, hypertriglyceridemia, peripheral neuropathy  are also affecting patient's functional outcome.   REHAB POTENTIAL: Good  CLINICAL DECISION MAKING: Evolving/moderate complexity  EVALUATION COMPLEXITY: Moderate   PLAN: PT FREQUENCY: 1x/week  PT DURATION: 12 weeks  PLANNED INTERVENTIONS: Therapeutic exercises, Therapeutic activity, Neuromuscular re-education, Balance training, Gait training, Patient/Family education, Stair training, Vestibular training, and Canalith repositioning  PLAN FOR NEXT SESSION:  Next session will plan on repeating functional outcome measures and updating goals  Lady Deutscher PT, DPT [phone]  Lady Deutscher, PT 08/02/2022, 3:24 PM

## 2022-08-03 ENCOUNTER — Ambulatory Visit (INDEPENDENT_AMBULATORY_CARE_PROVIDER_SITE_OTHER): Payer: PPO | Admitting: Family Medicine

## 2022-08-03 ENCOUNTER — Encounter: Payer: Self-pay | Admitting: Family Medicine

## 2022-08-03 VITALS — BP 134/84 | HR 79 | Resp 16 | Wt 180.0 lb

## 2022-08-03 DIAGNOSIS — N952 Postmenopausal atrophic vaginitis: Secondary | ICD-10-CM

## 2022-08-03 DIAGNOSIS — Z853 Personal history of malignant neoplasm of breast: Secondary | ICD-10-CM

## 2022-08-03 DIAGNOSIS — I10 Essential (primary) hypertension: Secondary | ICD-10-CM | POA: Diagnosis not present

## 2022-08-03 DIAGNOSIS — G64 Other disorders of peripheral nervous system: Secondary | ICD-10-CM

## 2022-08-03 DIAGNOSIS — L900 Lichen sclerosus et atrophicus: Secondary | ICD-10-CM

## 2022-08-03 DIAGNOSIS — N3945 Continuous leakage: Secondary | ICD-10-CM

## 2022-08-03 MED ORDER — GABAPENTIN 600 MG PO TABS: ORAL_TABLET | ORAL | 3 refills | Status: DC

## 2022-08-03 NOTE — Progress Notes (Signed)
Established patient visit  I,April Miller,acting as a scribe for Wilhemena Durie, MD.,have documented all relevant documentation on the behalf of Wilhemena Durie, MD,as directed by  Wilhemena Durie, MD while in the presence of Wilhemena Durie, MD.   Patient: Kelly Clark   DOB: 1929/06/14   88 y.o. Female  MRN: [id number] Visit Date: 08/03/2022  Today's healthcare provider: Wilhemena Durie, MD   Chief Complaint  Patient presents with   Follow-up   Hypertension   Subjective    HPI  Patient comes in today for follow-up.  She still is having balance problems but other than that has no complaints other than bladder issues.  The neuropathic discomfort at night continues to be an issue and the gabapentin even at a good '800mg'$   dose is not helping her.  It did help her in the past. Hypertension, follow-up  BP Readings from Last 3 Encounters:  08/03/22 134/84  03/30/22 (!) 151/82  09/29/21 (!) 154/75   Wt Readings from Last 3 Encounters:  08/03/22 180 lb (81.6 kg)  09/29/21 182 lb (82.6 kg)  03/29/21 182 lb (82.6 kg)     She was last seen for hypertension 4 months ago.  Management since that visit includes; Bladder complaints at an elevated blood pressure at this time we will stop HCTZ and try losartan 50 mg daily and see if we get a better overall response..  Outside blood pressures are 122/73-151/77.  --------------------------------------------------------------------------------------------------   Medications: Outpatient Medications Prior to Visit  Medication Sig   amLODipine (NORVASC) 5 MG tablet TAKE 1 TABLET(5 MG) BY MOUTH DAILY   Cholecalciferol (VITAMIN D3) 10000 units TABS Take 10,000 Units by mouth at bedtime.   clobetasol ointment (TEMOVATE) 1.06 % Apply 1 application topically once a week. Applied to affected area of vagina and rectum   latanoprost (XALATAN) 0.005 % ophthalmic solution Place 1 drop into both eyes at bedtime.    losartan  (COZAAR) 50 MG tablet Take 1 tablet (50 mg total) by mouth daily.   Multiple Vitamin (MULTIVITAMIN) tablet Take 1 tablet by mouth daily.   multivitamin-lutein (OCUVITE-LUTEIN) CAPS capsule Take 1 capsule by mouth every morning.    polyethylene glycol powder (GLYCOLAX/MIRALAX) 17 GM/SCOOP powder Take 17 g by mouth at bedtime.    RESVERATROL-GRAPE PO Take 1 capsule by mouth daily. CoQ10 Resveratrol & Grape Seed   [DISCONTINUED] gabapentin (NEURONTIN) 600 MG tablet TAKE 1 TABLET BY MOUTH AT BEDTIME   [DISCONTINUED] colchicine 0.6 MG tablet Take 1 tablet (0.6 mg total) by mouth daily. Take 2 tablets (1.2 mg total) by mouth stat.  Then 1 tablet by mouth daily (Patient taking differently: Take 0.6 mg by mouth daily. Take 2 tablets (1.2 mg total) by mouth stat.  Then 1 tablet by mouth daily)   [DISCONTINUED] Ginkgo Biloba 40 MG TABS Take 1 tablet by mouth daily. @@ noon   [DISCONTINUED] hydrochlorothiazide (HYDRODIURIL) 12.5 MG tablet TAKE 1 TABLET BY MOUTH ONCE DAILY   [DISCONTINUED] Omega-3 Fatty Acids (FISH OIL) 1000 MG CAPS Take by mouth.   No facility-administered medications prior to visit.    Review of Systems  Constitutional:  Negative for appetite change, chills, fatigue and fever.  Respiratory:  Negative for chest tightness and shortness of breath.   Cardiovascular:  Negative for chest pain and palpitations.  Gastrointestinal:  Negative for abdominal pain, nausea and vomiting.  Neurological:  Negative for dizziness and weakness.    Last lipids Lab Results  Component  Value Date   CHOL 149 09/22/2021   HDL 47 09/22/2021   LDLCALC 77 09/22/2021   TRIG 146 09/22/2021   CHOLHDL 3.2 09/22/2021       Objective    BP 134/84 (BP Location: Left Arm, Patient Position: Sitting, Cuff Size: Large)   Pulse 79   Resp 16   Wt 180 lb (81.6 kg)   SpO2 96%   BMI 32.92 kg/m  BP Readings from Last 3 Encounters:  08/03/22 134/84  03/30/22 (!) 151/82  09/29/21 (!) 154/75   Wt Readings from  Last 3 Encounters:  08/03/22 180 lb (81.6 kg)  09/29/21 182 lb (82.6 kg)  03/29/21 182 lb (82.6 kg)      Physical Exam Vitals reviewed.  Constitutional:      Appearance: Normal appearance. She is well-developed.     Comments: She appears much younger than her age of 29  HENT:     Head: Normocephalic and atraumatic.  Eyes:     General: No scleral icterus. Neck:     Thyroid: No thyromegaly.  Cardiovascular:     Rate and Rhythm: Normal rate and regular rhythm.     Heart sounds: Normal heart sounds.  Pulmonary:     Effort: Pulmonary effort is normal.     Breath sounds: Normal breath sounds.  Abdominal:     Palpations: Abdomen is soft.  Lymphadenopathy:     Cervical: No cervical adenopathy.  Skin:    General: Skin is warm and dry.  Neurological:     Mental Status: She is alert and oriented to person, place, and time.  Psychiatric:        Behavior: Behavior normal.        Thought Content: Thought content normal.        Judgment: Judgment normal.       No results found for any visits on 08/03/22.  Assessment & Plan     1. Benign essential HTN Good control.  2. Disorder of peripheral nervous system I do not want to increase the dose but she is only taking a bedtime dose.  Increased from 800 mg to 900 mg at bedtime - gabapentin (NEURONTIN) 600 MG tablet; Take 1 1/2 Tablets Nightly  Dispense: 90 tablet; Refill: 3  3. Continuous leakage of urine Refer to pelvic physical therapy patient request.  They may also be able to give patient some exercises for her unsteady gait - Ambulatory referral to Physical Therapy  4. Vaginal atrophy   5. Lichen sclerosus   6. History of breast cancer    Return in about 6 months (around 02/03/2023).      I, Wilhemena Durie, MD, have reviewed all documentation for this visit. The documentation on 08/08/22 for the exam, diagnosis, procedures, and orders are all accurate and complete.    Vashti Bolanos Cranford Mon, MD  [facility] [phone] (phone) [phone] (fax)  [place]

## 2022-08-09 ENCOUNTER — Ambulatory Visit: Payer: PPO | Admitting: Physical Therapy

## 2022-08-09 ENCOUNTER — Encounter: Payer: Self-pay | Admitting: Physical Therapy

## 2022-08-09 DIAGNOSIS — R2681 Unsteadiness on feet: Secondary | ICD-10-CM

## 2022-08-09 DIAGNOSIS — R42 Dizziness and giddiness: Secondary | ICD-10-CM

## 2022-08-09 NOTE — Therapy (Addendum)
OUTPATIENT PHYSICAL THERAPY VESTIBULAR TREATMENT NOTE/ DISCHARGE NOTE Dates of service 06/20/2022 to 08/09/2022 Total number of visits 7  Patient Name: Kelly Clark MRN: [id number] DOB:Sep 15, 1929, 88 y.o., female Today's Date: 08/09/2022  PCP: [name]., MD REFERRING PROVIDER: Beverly Gust, MD   PT End of Session - 08/09/22 1100     Visit Number 7    Number of Visits 13    Date for PT Re-Evaluation 09/12/22    PT Start Time 1101    PT Stop Time 1157    PT Time Calculation (min) 56 min    Equipment Utilized During Treatment Gait belt    Activity Tolerance Patient tolerated treatment well    Behavior During Therapy [facility] for tasks assessed/performed             Past Medical History:  Diagnosis Date   Basal cell carcinoma 2019   RIGHT THUMB REGION   Breast cancer ([place]) 06/27/2018   RIGHT   Cancer ([place]) 1977   melanoma . SURGERY RIGHT ARM   Hemorrhoids    Hypertension    Hypertriglyceridemia    Insomnia    Menopausal disorder    Neutropenia (HCC)    Paresthesia    Peripheral neuropathy    Tinea corporis    Vitamin D deficiency    Past Surgical History:  Procedure Laterality Date   ABDOMINAL HYSTERECTOMY  1977   BREAST BIOPSY Bilateral 1977   benign   BREAST BIOPSY Right 06/27/2018   INVASIVE MAMMARY CARCINOMA, NO SPECIAL TYPE. 2 oclock ER/PR negative HER2 positive   BREAST EXCISIONAL BIOPSY Right 07/23/2018   lumpectomy   BREAST LUMPECTOMY Right 2019   [facility]   BREAST LUMPECTOMY WITH SENTINEL LYMPH NODE BIOPSY Right 07/23/2018   6 mm ER/PR negative, Her 2 neu 3+; node negative.  Surgeon: Robert Bellow, MD;  Location: [facility] ORS;  Service: General;  Laterality: Right;   COLONOSCOPY  2007   ELECTROCARDIOGRAM     EYE SURGERY Bilateral    CATARACT EXTRACTIONS   Patient Active Problem List   Diagnosis Date Noted   Lichen sclerosus 78/29/5621   Actinic keratosis 09/21/2018   Malignant neoplasm of upper-inner quadrant of right breast in female,  estrogen receptor negative ([place]) 07/11/2018   Benign essential HTN 09/08/2017   Adult BMI 30+ 07/06/2015   Hemorrhoid 07/06/2015   Disorder of peripheral nervous system 07/06/2015   Avitaminosis D 07/06/2015   Vaginal atrophy 07/06/2015   Abnormal LFTs 08/21/2009   Cannot sleep 05/23/2000   Hypertriglyceridemia 05/19/2000   ONSET DATE: Approximately 3 months ago  REFERRING DIAG: Left BPPV   Rationale for Evaluation and Treatment Rehabilitation  THERAPY DIAG:  Dizziness and giddiness  Unsteadiness on feet  SUBJECTIVE:  Pt accompanied by: self  PERTINENT HISTORY: Patient has been seen in the clinic in 2018 due to dizziness and potential BPPV.  Patient reports that the difficulties that she is experiencing now are different from her prior episode. Patient's chief concern is that she veers to the right and backwards at times and is off balance.  Patient reports that when she first gets up with transitional movements she gets unsteadiness.  Patient states that she can stoop and bend without difficulty and that once she gets stabilized upon standing she can walk without difficulty.  Patient reports "I can walk without any trouble; I can walk good and fast".  Patient denies vertigo and dizziness, and reports unsteadiness and staggering.  Patient reports she saw Dr. Tami Ribas and had two  Epley maneuvers performed on 2 separate occasions in the ENT office. 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  Patient reports that up until a few months ago she was going to the [facility] on Tuesdays and Thursdays and participating in a cardio type class and yoga class.  Patient states that she began to have burning sensation in her feet and legs which patient reports she believes is neuropathy.  Patient reports that "the pads" behind her toes seem to be inflamed and that she is going to see her primary care doctor on August 16 at the [place] [facility] in regards to these symptoms.  Patient reports that  she took some time off for the past few months from her exercise classes due to her imbalance and burning sensation in her legs.  Patient reports that she lives alone and she is concerned about falling. Dr. Tami Ribas told her to carry the cane with her. Pt states she does not really need it and "90% of the time it is not with me". Pt states otherwise she feels great and does not have any other issues or pains besides the feet. Pt states she never goes barefooted even in the house and she wears good shoes with arch support and non-skid soles. Pt states she does not wear flip flops or bedrooms shoes.  Patient reports that she drove herself to the clinic this date.  SYMPTOM BEHAVIOR: Non-Vestibular symptoms:  denies Type of dizziness: Imbalance (Disequilibrium) and Unsteady with head/body turns Frequency: Daily Duration: Transient imbalance upon standing   Aggravating factors:  Standing up Relieving factors:  Nothing Progression of symptoms: better History of similar episodes: No prior episodes of imbalance with transitional movements. Description of dizziness: unsteadiness Auditory complaints (tinnitus, pain, drainage): denies Vision (last eye exam, diplopia, recent changes): pt wears glasses, she just had an eye exam and they said everything was great.  Current Symptoms: (dysarthria, dysphagia, drop attacks, bowel and bladder changes, recent weight loss/gain)    Review of systems negative for red flags.      DIAGNOSTIC FINDINGS:  PAIN:  Are you having pain? No  PRECAUTIONS: None  PATIENT GOALS; Pt would like to not have falls and would like to not have episodes of imbalance when she stands up.  Patient would like to be able to resume her yoga classes because she likes the stretching component.    SUBJECTIVE STATEMENT: Pt states she did well this past week. Patient states she has been practicing her leg lifts. Pt states she has not had any imbalance this week.  Patient states that she is very  pleased with the amount of progress that she has made with therapy.  Patient states that she feels ready to return to the yoga class at the [facility].  OBJECTIVE:  TODAY'S TREATMENT: 08/09/2022:  FUNCTIONAL OUTCOME MEASURES:  Results Comments  DHI 10/100 Low perception of handicap  ABC Scale 82.5% Falls risk; in need of intervention  FOTO 62 Like patients nationally had a score of 54/100   Clinical Test of Sensory Interaction for Balance (CTSIB):  CONDITION TIME STRATEGY SWAY  Eyes open, firm surface 30 seconds ankle +2  Eyes closed, firm surface 30 seconds ankle +2  Eyes open, foam surface 30 seconds ankle +2  Eyes closed, foam surface 10 seconds Ankle, hip, reaching +4   Repeated functional outcome testing and compared to prior test results. Discussed progress towards goals and plans for discharge.  Pt performed multiple reps at ballet bar with UEs support,  bilateral heel raises with 3 second holds and then toe raises 3 second holds with cuing for posture and technique. Instructed patient that she can perform this exercise at home while holding to kitchen countertop. Pt performed 20 reps mini squats at ballet bar with UEs support with verbal cuing for technique.  Discussed home program. Pt brought prior home exercise program packets from this most recent episode of PT and from 2 prior episodes of therapy. Pt asking which exercises to continue to perform at home. Pt to continue to perform the following exercise for home program: - Standing Marching  - 1 x daily - 7 x weekly - 2 sets - 10 reps - 3 seconds hold - Half Tandem Stance Balance with Head Rotation  - 1 x daily - 7 x weekly - 1 sets - 10 reps - 30  to 60 seconds hold - Half Tandem Stance Balance with Head Nods  - 1 x daily - 7 x weekly - 1 sets - 10 reps - 30-60 seconds hold - 5 breathe - Feet Together with Vertical Head turns  - 1 x daily - 7 x weekly - 1 sets - 10 reps - 30-60 seconds hold Standing heel raises and then toe raises  with 3 seconds holds Mini squats with 3-5 second holds 2 sets of 20 reps  Education provided throughout session via verbal cues and demonstration to facilitate movement at target joints for exercises performed.   PATIENT EDUCATION: Education details: Discussed home exercise program, progress towards goals, discharge plans Person educated: Patient Education method: Explanation; verbal cues Education comprehension: verbalized understanding, demonstrates understanding  Access Code: [email or web address] URL: [email or web address] Date: 07/06/2022 Prepared by: Lady Deutscher  Exercises - Standing Marching  - 1 x daily - 7 x weekly - 2 sets - 10 reps - 3 seconds hold - Half Tandem Stance Balance with Head Rotation  - 1 x daily - 7 x weekly - 1 sets - 10 reps - 30  to 60 seconds hold - Half Tandem Stance Balance with Head Nods  - 1 x daily - 7 x weekly - 1 sets - 10 reps - 30-60 seconds hold - 5 breathe - Feet Together with Vertical Head turns  - 1 x daily - 7 x weekly - 1 sets - 10 reps - 30-60 seconds hold - Sit to Stand  - 1 x daily - 7 x weekly - 2 sets - 15 reps; added progression of having pillow under feet with sit to stand  GOALS: Goals reviewed with patient? Yes  SHORT TERM GOALS: Target date: 08/01/2022  Pt will be independent with HEP in order to improve balance in order to decrease fall risk and improve function at home and in the community. Baseline:  Goal status: MET  LONG TERM GOALS: Target date: 09/12/2022  Patient will have improved FOTO score of 5 points or greater in order to demonstrate improvements in patient's ADLs and functional performance.  Baseline: 64/100 on 06/20/2022; scored 62/100 on 08/09/22 Goal status: NOT MET  2.  Patient will report 50% or greater improvement in their symptoms of imbalance with transitional movements. Baseline: pt reports unsteadiness when she stands up; reports 95% because every once in awhile she will get a "nitch" sensation when she  is up when she says she is not paying attention Goal status: MET  3.  Patient will reduce falls risk as indicated by Activities Specific Balance Confidence Scale (ABC) >75%.      Baseline: 67.5% on 06/20/22;  scored 82.5% on 08/09/22 Goal status: MET  4.  Patient will report that she has been able to return to participating in her exercise classes at the [facility] in order to improve balance and decrease her fall risk. Baseline: reports she has not gone to [facility] classes in a few months; pt states that she feels that she can return to the yoga class at [facility] without difficulty now Goal status: MET  5. Patient will be able to maintain balance on foam surface with feet together with eyes open for 15 seconds or more with +3 or less sway. Baseline: +4 score and less than 10 seconds for EO and EC on foam surface; +2 score for 30 second hold with EO and feet together on foam on 08/09/2022 Goal status: Met ASSESSMENT:  CLINICAL IMPRESSION: Patient reports that she feels she has made a lot of progress and reports 95% improvement in her symptoms of imbalance overall since starting therapy. Patient met 1/1 short-term goal and 4/5 long term goals.  Patient improved from 67.5% to 82.5% on the ABC scale demonstrating improved balance and decrease risk of falls.  Patient scored low perception of handicap on the dizziness handicap inventory scoring 10/100 which is a six-point improvement as compared to testing at initial eval. Pt is independent with home exercise program and plans to resume yoga classes at the [facility] upon discharge. Pt in agreement with discharge from PT services at this time.   OBJECTIVE IMPAIRMENTS decreased balance, difficulty walking, decreased strength, dizziness, impaired sensation, and pain.   ACTIVITY LIMITATIONS community activity and exercise classes at [facility] .   PERSONAL FACTORS Age, Past/current experiences, and 3+ comorbidities: Hypertension, hypertriglyceridemia, peripheral neuropathy  are  also affecting patient's functional outcome.   REHAB POTENTIAL: Good  CLINICAL DECISION MAKING: Evolving/moderate complexity  EVALUATION COMPLEXITY: Moderate   PLAN: PT FREQUENCY: 1x/week  PT DURATION: 12 weeks  PLANNED INTERVENTIONS: Therapeutic exercises, Therapeutic activity, Neuromuscular re-education, Balance training, Gait training, Patient/Family education, Stair training, Vestibular training, and Canalith repositioning  PLAN FOR NEXT SESSION:  Discharge from vestibular PT services.   Lady Deutscher PT, DPT [phone]  Lady Deutscher, PT 08/09/2022, 12:16 PM

## 2022-08-16 ENCOUNTER — Encounter: Payer: PPO | Admitting: Physical Therapy

## 2022-08-16 DIAGNOSIS — G2581 Restless legs syndrome: Secondary | ICD-10-CM | POA: Diagnosis not present

## 2022-08-16 DIAGNOSIS — R7309 Other abnormal glucose: Secondary | ICD-10-CM | POA: Diagnosis not present

## 2022-08-16 DIAGNOSIS — R202 Paresthesia of skin: Secondary | ICD-10-CM | POA: Diagnosis not present

## 2022-08-16 LAB — VITAMIN B12: Vitamin B-12: 184

## 2022-08-16 LAB — IRON,TIBC AND FERRITIN PANEL
%SAT: 40
Ferritin: 67
Iron: 130
TIBC: 329.1

## 2022-08-16 LAB — CBC AND DIFFERENTIAL
HCT: 42 (ref 36–46)
Hemoglobin: 13.9 (ref 12.0–16.0)
Platelets: 157 10*3/uL (ref 150–400)
WBC: 4.3

## 2022-08-16 LAB — BASIC METABOLIC PANEL
BUN: 14 (ref 4–21)
CO2: 28 — AB (ref 13–22)
Chloride: 107 (ref 99–108)
Creatinine: 0.9 (ref 0.5–1.1)
Glucose: 82
Potassium: 3.9 mEq/L (ref 3.5–5.1)
Sodium: 141 (ref 137–147)

## 2022-08-16 LAB — CBC: RBC: 4.47 (ref 3.87–5.11)

## 2022-08-16 LAB — COMPREHENSIVE METABOLIC PANEL
Albumin: 4.2 (ref 3.5–5.0)
Calcium: 9.3 (ref 8.7–10.7)

## 2022-08-16 LAB — TSH: TSH: 1.87 (ref 0.41–5.90)

## 2022-08-16 LAB — HEPATIC FUNCTION PANEL
ALT: 15 U/L (ref 7–35)
AST: 19 (ref 13–35)
Alkaline Phosphatase: 50 (ref 25–125)
Bilirubin, Total: 0.6

## 2022-08-16 LAB — HEMOGLOBIN A1C: Hemoglobin A1C: 5.8

## 2022-08-23 ENCOUNTER — Encounter: Payer: PPO | Admitting: Physical Therapy

## 2022-08-24 ENCOUNTER — Ambulatory Visit: Payer: PPO | Attending: Unknown Physician Specialty

## 2022-08-24 ENCOUNTER — Other Ambulatory Visit: Payer: Self-pay

## 2022-08-24 DIAGNOSIS — M6281 Muscle weakness (generalized): Secondary | ICD-10-CM | POA: Diagnosis not present

## 2022-08-24 DIAGNOSIS — N3946 Mixed incontinence: Secondary | ICD-10-CM | POA: Diagnosis not present

## 2022-08-24 DIAGNOSIS — R2681 Unsteadiness on feet: Secondary | ICD-10-CM | POA: Insufficient documentation

## 2022-08-24 DIAGNOSIS — R2689 Other abnormalities of gait and mobility: Secondary | ICD-10-CM | POA: Diagnosis not present

## 2022-08-24 DIAGNOSIS — R42 Dizziness and giddiness: Secondary | ICD-10-CM | POA: Diagnosis not present

## 2022-08-24 DIAGNOSIS — N3945 Continuous leakage: Secondary | ICD-10-CM | POA: Insufficient documentation
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
## 2022-08-24 NOTE — Patient Instructions (Signed)
TOILET POSTURE: Urination: feet flat, lean forward with forearms on legs to fully empty bladder. Bowel movement: place feet flat on Squatty Potty or stool so knees are higher than hips, lean forward to relax pelvic floor in order to avoid strain.  WATER: Drink an 8 oz. Cup of water in the morning, prior to drinking anything else.

## 2022-08-26 ENCOUNTER — Other Ambulatory Visit: Payer: Self-pay | Admitting: Family Medicine

## 2022-08-26 DIAGNOSIS — I10 Essential (primary) hypertension: Secondary | ICD-10-CM

## 2022-08-30 ENCOUNTER — Ambulatory Visit: Payer: PPO

## 2022-08-30 ENCOUNTER — Other Ambulatory Visit: Payer: Self-pay

## 2022-08-30 ENCOUNTER — Encounter: Payer: PPO | Admitting: Physical Therapy

## 2022-08-30 DIAGNOSIS — R2681 Unsteadiness on feet: Secondary | ICD-10-CM

## 2022-08-30 DIAGNOSIS — N3946 Mixed incontinence: Secondary | ICD-10-CM

## 2022-08-30 DIAGNOSIS — R2689 Other abnormalities of gait and mobility: Secondary | ICD-10-CM

## 2022-08-30 DIAGNOSIS — M6281 Muscle weakness (generalized): Secondary | ICD-10-CM

## 2022-08-30 NOTE — Therapy (Addendum)
OUTPATIENT PHYSICAL THERAPY FEMALE PELVIC TREATMENT   Patient Name: Kelly Clark MRN: [id number] DOB:Mar 14, 1929, 88 y.o., female Today's Date: 08/30/2022   PT End of Session - 08/30/22 1017     Visit Number 2    Number of Visits 9    Date for PT Re-Evaluation 10/23/22    Authorization Type Medicare    Progress Note Due on Visit 10    PT Start Time 1015    PT Stop Time 1100    PT Time Calculation (min) 45 min    Activity Tolerance Patient tolerated treatment well    Behavior During Therapy [facility] for tasks assessed/performed              Past Medical History:  Diagnosis Date   Basal cell carcinoma 2019   RIGHT THUMB REGION   Breast cancer ([place]) 06/27/2018   RIGHT   Cancer ([place]) 1977   melanoma . SURGERY RIGHT ARM   Hemorrhoids    Hypertension    Hypertriglyceridemia    Insomnia    Menopausal disorder    Neutropenia (HCC)    Paresthesia    Peripheral neuropathy    Tinea corporis    Vitamin D deficiency    Past Surgical History:  Procedure Laterality Date   ABDOMINAL HYSTERECTOMY  1977   BREAST BIOPSY Bilateral 1977   benign   BREAST BIOPSY Right 06/27/2018   INVASIVE MAMMARY CARCINOMA, NO SPECIAL TYPE. 2 oclock ER/PR negative HER2 positive   BREAST EXCISIONAL BIOPSY Right 07/23/2018   lumpectomy   BREAST LUMPECTOMY Right 2019   [facility]   BREAST LUMPECTOMY WITH SENTINEL LYMPH NODE BIOPSY Right 07/23/2018   6 mm ER/PR negative, Her 2 neu 3+; node negative.  Surgeon: Robert Bellow, MD;  Location: [facility] ORS;  Service: General;  Laterality: Right;   COLONOSCOPY  2007   ELECTROCARDIOGRAM     EYE SURGERY Bilateral    CATARACT EXTRACTIONS   Patient Active Problem List   Diagnosis Date Noted   Lichen sclerosus 08/81/1031   Actinic keratosis 09/21/2018   Malignant neoplasm of upper-inner quadrant of right breast in female, estrogen receptor negative ([place]) 07/11/2018   Benign essential HTN 09/08/2017   Adult BMI 30+ 07/06/2015   Hemorrhoid 07/06/2015   Disorder  of peripheral nervous system 07/06/2015   Avitaminosis D 07/06/2015   Vaginal atrophy 07/06/2015   Abnormal LFTs 08/21/2009   Cannot sleep 05/23/2000   Hypertriglyceridemia 05/19/2000    PCP: Miguel Aschoff, MD (but he will be leaving soon)  REFERRING PROVIDER: Miguel Aschoff, MD  REFERRING DIAG: continuous leakage of urine 08/03/22 referral date.  THERAPY DIAG:  Mixed incontinence  Muscle weakness (generalized)  Unsteadiness on feet  Other abnormalities of gait and mobility   SUBJECTIVE:        Pt reported her bladder is acting up today and she has to void every 15 minutes. Pt has been using a stool during bowel and bladder movements. Pt is using a pillow between knees at night and finds it comfortable. Today is the first she hasn't had water first thing in the morning.  PAIN:  Are you having pain? No NPRS scale: 0/10    PATIENT GOALS She's getting ready to go to [place] (09/13/22-09/19/22) by herself and is very IND but wants to pee less.   OBJECTIVE: Today's treatment  NMR: Access Code: [email or web address] URL: [email or web address] Date: 08/30/2022 Prepared by: Geoffry Paradise  Exercises - Supine Diaphragmatic Breathing  - 1 x daily - 7 x weekly - 2 sets - 5 reps - Supine Pelvic Tilt  - 1 x daily - 7 x weekly - 1 sets - 10 reps - Clamshell with Resistance  - 1 x daily - 3 x weekly - 3 sets - 10 reps Cues and demo for proper technique. Performed with S for safety.    PALPATION:    General: pt denied pain during palpation along spine, hips, glutes. Incr. Tension noted at L inf. Diaphragm.                External Perineal Exam : Pt noted to use abdominals and glute max to contraction PFM (pelvic floor muscles), pt had difficulty isolating PFM, even with cues.                             SELF CARE: PATIENT EDUCATION:  Education details: PT provided pt extension education on function and role of PFM, using pictures. PT discussed the importance of pelvic posture and role of diaphragm, core and PFM.  Person educated: Patient Education method: Explanation, Demonstration, Verbal cues, and Handouts Education comprehension: verbalized understanding and returned demonstration   HOME EXERCISE PROGRAM: Medbridge: [email or web address]  ASSESSMENT:  CLINICAL IMPRESSION: Today's skilled session focused on completing exam and establishing HEP. Pt noted to have difficulty isolating PFM without glute and core compensation.  Pt noted to have incr. Tension at L inf. Diaphragm. Pt required extension cues for proper technique during HEP  but denied pain and progressed to proper technique by end of session. Pt continues to experience leakage, urgency, incr. Frequency, impaired posture, and decr. Strength. Pt would continue to benefit from skilled PT to improve deficits listed above during all ADLs.    OBJECTIVE IMPAIRMENTS Abnormal gait, decreased balance, decreased coordination, decreased endurance, decreased mobility, decreased ROM, decreased strength, hypomobility, impaired flexibility, impaired sensation, and postural dysfunction.     GOALS: Goals reviewed with patient? Yes  SHORT TERM GOALS: Target date: 09/27/2022  Pt will be IND in HEP to improve strength, balance, flexibility during all ADLs. Baseline: Goal status: INITIAL  2.  Complete FOTO and write goal prn. Baseline:  Goal status: INITIAL  3.  Complete spine and pelvic exam and write goals as indicated. Baseline:  Goal status: MET  4.  Pt will demo proper toileting posture and verbalize technique to reduce bladder irritability and fully empty bladder. Baseline:  Goal status: INITIAL   LONG TERM GOALS: Target date: 10/25/2022   Pt will demonstrate improved coordination of pelvic floor muscles (PFM) with breath in order to  report no leakage over the last two weeks to improve QOL and safety. Baseline:  Goal status: INITIAL  2.  Pt will amb. 200' over even/uneven terrain without leakage, MOD I level, in order to safely amb. While trips. Baseline:  Goal status: INITIAL  3.  Pt will demo improved fluid intake (water) and coordination of PFM to only get up 1 or less times a night to void. Baseline:  Goal status: INITIAL  4.  Pt will demo improve SLS (R and L LE) to 5  sec. On each side to improve balance during gait. Baseline: 1 sec. On each leg with S. Goal status: INITIAL   PLAN: PLAN FOR NEXT SESSION: review HEP, trial PFM contractions.Check hip tightness 2/2 R hip ER in hooklying.  Trace Wirick L, PT 08/30/2022, 11:13 AM  Geoffry Paradise, PT,DPT 08/30/22 11:13 AM Phone: [phone] Fax: [phone]

## 2022-08-31 ENCOUNTER — Ambulatory Visit: Payer: PPO

## 2022-09-06 ENCOUNTER — Encounter: Payer: PPO | Admitting: Physical Therapy

## 2022-09-06 ENCOUNTER — Ambulatory Visit (INDEPENDENT_AMBULATORY_CARE_PROVIDER_SITE_OTHER): Payer: PPO

## 2022-09-06 DIAGNOSIS — Z23 Encounter for immunization: Secondary | ICD-10-CM

## 2022-09-06 DIAGNOSIS — E538 Deficiency of other specified B group vitamins: Secondary | ICD-10-CM | POA: Diagnosis not present

## 2022-09-07 ENCOUNTER — Ambulatory Visit: Payer: PPO

## 2022-09-07 ENCOUNTER — Other Ambulatory Visit: Payer: Self-pay

## 2022-09-07 DIAGNOSIS — N3946 Mixed incontinence: Secondary | ICD-10-CM | POA: Diagnosis not present

## 2022-09-07 DIAGNOSIS — M6281 Muscle weakness (generalized): Secondary | ICD-10-CM

## 2022-09-07 DIAGNOSIS — R42 Dizziness and giddiness: Secondary | ICD-10-CM

## 2022-09-07 DIAGNOSIS — R2689 Other abnormalities of gait and mobility: Secondary | ICD-10-CM

## 2022-09-07 DIAGNOSIS — R2681 Unsteadiness on feet: Secondary | ICD-10-CM

## 2022-09-07 NOTE — Therapy (Signed)
OUTPATIENT PHYSICAL THERAPY FEMALE PELVIC TREATMENT   Patient Name: Kelly Clark MRN: [id number] DOB:[birth year], 88 y.o., female Today's Date: 09/07/2022   PT End of Session - 09/07/22 1105     Visit Number 3    Number of Visits 9    Date for PT Re-Evaluation 10/23/22    Authorization Type Medicare    Progress Note Due on Visit 10    PT Start Time 1103    PT Stop Time 1143    PT Time Calculation (min) 40 min    Activity Tolerance Patient tolerated treatment well    Behavior During Therapy [facility] for tasks assessed/performed              Past Medical History:  Diagnosis Date   Basal cell carcinoma 2019   RIGHT THUMB REGION   Breast cancer ([place]) 06/27/2018   RIGHT   Cancer ([place]) 1977   melanoma . SURGERY RIGHT ARM   Hemorrhoids    Hypertension    Hypertriglyceridemia    Insomnia    Menopausal disorder    Neutropenia (HCC)    Paresthesia    Peripheral neuropathy    Tinea corporis    Vitamin D deficiency    Past Surgical History:  Procedure Laterality Date   ABDOMINAL HYSTERECTOMY  1977   BREAST BIOPSY Bilateral 1977   benign   BREAST BIOPSY Right 06/27/2018   INVASIVE MAMMARY CARCINOMA, NO SPECIAL TYPE. 2 oclock ER/PR negative HER2 positive   BREAST EXCISIONAL BIOPSY Right 07/23/2018   lumpectomy   BREAST LUMPECTOMY Right 2019   [facility]   BREAST LUMPECTOMY WITH SENTINEL LYMPH NODE BIOPSY Right 07/23/2018   6 mm ER/PR negative, Her 2 neu 3+; node negative.  Surgeon: Robert Bellow, MD;  Location: [facility] ORS;  Service: General;  Laterality: Right;   COLONOSCOPY  2007   ELECTROCARDIOGRAM     EYE SURGERY Bilateral    CATARACT EXTRACTIONS   Patient Active Problem List   Diagnosis Date Noted   Lichen sclerosus 40/98/1191   Actinic keratosis 09/21/2018   Malignant neoplasm of upper-inner quadrant of right breast in female, estrogen receptor negative ([place]) 07/11/2018   Benign essential HTN 09/08/2017   Adult BMI 30+ 07/06/2015   Hemorrhoid 07/06/2015   Disorder  of peripheral nervous system 07/06/2015   Avitaminosis D 07/06/2015   Vaginal atrophy 07/06/2015   Abnormal LFTs 08/21/2009   Cannot sleep 05/23/2000   Hypertriglyceridemia 05/19/2000    PCP: Miguel Aschoff, MD (but he will be leaving soon)  REFERRING PROVIDER: Miguel Aschoff, MD  REFERRING DIAG: continuous leakage of urine 08/03/22 referral date.  THERAPY DIAG:  Mixed incontinence  Muscle weakness (generalized)  Unsteadiness on feet  Other abnormalities of gait and mobility  Dizziness and giddiness   SUBJECTIVE:        Pt reported her bladder acted up yesterday at the neurology office but is better today. Pt has been to a lot of MD appt's this week for vaccines. Pt has been performing HEP once a day vs. Twice a day.  PAIN:  Are you having pain? No NPRS scale: 0/10    PATIENT GOALS She's getting ready to go to [place] (09/13/22-09/19/22) by herself and is very IND but wants to pee less.   OBJECTIVE: Today's treatment  NMR: Access Code: [email or web address] URL: [email or web address] Date: 09/07/2022 Prepared by: Geoffry Paradise  Exercises - Supine Diaphragmatic Breathing  - 1 x daily - 7 x weekly - 2 sets - 5 reps - Supine and seated Pelvic Tilt  - 1 x daily - 7 x weekly - 1 sets - 10 reps - Sidelying Pelvic Floor Contraction with Self-Palpation  - 2 x daily - 7 x weekly - 1 sets - 10 reps. Also performed in hooklying and sidelying. Extensive cues and demo to improve coordination with breath and reduce compensatory strategies.                            SELF CARE: PATIENT EDUCATION:  Education details: PT again provided pt extensive education on PFM contractions with breath, using pictures. PT educated pt on performing 10 reps of PFM contraction to reduce urgency. PT discussed the  importance of pelvic posture and role of diaphragm, core and PFM.  Person educated: Patient Education method: Explanation, Demonstration, Verbal cues, and Handouts Education comprehension: verbalized understanding and returned demonstration   HOME EXERCISE PROGRAM: Medbridge: [email or web address]  ASSESSMENT:  CLINICAL IMPRESSION: Today's skilled session focused on reviewing HEP and performing PFM contraction with breath. Pt required incr. Time and extensive cues/demo for proper technique. Pt noted to use B hip add, core and glutes to compensate for weak PFM during contractions. Pt performed in sidelying with less compensatory strategies. PT provided pt with seated HEP only to perform on the airplane next week as needed to reduce urge incontinence. Pt continues to experience leakage, urgency, incr. Frequency, impaired posture, and decr. Strength. Pt would continue to benefit from skilled PT to improve deficits listed above during all ADLs.      GOALS: Goals reviewed with patient? Yes  SHORT TERM GOALS: Target date: 10/05/2022  Pt will be IND in HEP to improve strength, balance, flexibility during all ADLs. Baseline: Goal status: INITIAL  2.  Complete FOTO and write goal prn. Baseline:  Goal status: INITIAL  3.  Complete spine and pelvic exam and write goals as indicated. Baseline:  Goal status: MET  4.  Pt will demo proper toileting posture and verbalize technique to reduce bladder irritability and fully empty bladder. Baseline:  Goal status: INITIAL   LONG TERM GOALS: Target date: 11/02/2022   Pt will demonstrate improved coordination of pelvic floor muscles (PFM) with breath in order to report no leakage over the last two weeks to improve QOL and safety. Baseline:  Goal status: INITIAL  2.  Pt will amb. 200' over even/uneven terrain without leakage, MOD I level, in order to safely amb. While trips. Baseline:  Goal status: INITIAL  3.  Pt will demo improved fluid intake (water)  and coordination of PFM to only get up 1 or less times a night to void. Baseline:  Goal status: INITIAL  4.  Pt will demo improve SLS (R and L LE) to 5 sec. On each side to improve balance during gait. Baseline: 1 sec. On each leg with S. Goal status: INITIAL   PLAN: PLAN FOR NEXT SESSION: assess STGs.  Merlina Marchena L, PT 09/07/2022, 11:06 AM  Geoffry Paradise, PT,DPT 09/07/22 11:06 AM Phone: [phone] Fax: [phone]

## 2022-09-12 DIAGNOSIS — E538 Deficiency of other specified B group vitamins: Secondary | ICD-10-CM | POA: Diagnosis not present

## 2022-09-13 ENCOUNTER — Encounter: Payer: PPO | Admitting: Physical Therapy

## 2022-09-14 ENCOUNTER — Ambulatory Visit: Payer: PPO

## 2022-09-20 ENCOUNTER — Encounter: Payer: PPO | Admitting: Physical Therapy

## 2022-09-21 ENCOUNTER — Other Ambulatory Visit: Payer: Self-pay

## 2022-09-21 ENCOUNTER — Ambulatory Visit: Payer: PPO | Attending: Unknown Physician Specialty

## 2022-09-21 DIAGNOSIS — R42 Dizziness and giddiness: Secondary | ICD-10-CM | POA: Insufficient documentation

## 2022-09-21 DIAGNOSIS — R2689 Other abnormalities of gait and mobility: Secondary | ICD-10-CM | POA: Insufficient documentation

## 2022-09-21 DIAGNOSIS — M6281 Muscle weakness (generalized): Secondary | ICD-10-CM | POA: Insufficient documentation

## 2022-09-21 DIAGNOSIS — N3946 Mixed incontinence: Secondary | ICD-10-CM | POA: Insufficient documentation

## 2022-09-21 DIAGNOSIS — R2681 Unsteadiness on feet: Secondary | ICD-10-CM | POA: Diagnosis not present

## 2022-09-21 NOTE — Therapy (Signed)
OUTPATIENT PHYSICAL THERAPY FEMALE PELVIC TREATMENT   Patient Name: Kelly Clark MRN: [id number] DOB:[birth year], 88 y.o., female Today's Date: 09/21/2022   PT End of Session - 09/21/22 1104     Visit Number 4    Number of Visits 9    Date for PT Re-Evaluation 10/23/22    Progress Note Due on Visit 10    PT Start Time 1101    PT Stop Time 1144    PT Time Calculation (min) 43 min    Activity Tolerance Patient tolerated treatment well    Behavior During Therapy [facility] for tasks assessed/performed              Past Medical History:  Diagnosis Date   Basal cell carcinoma 2019   RIGHT THUMB REGION   Breast cancer ([place]) 06/27/2018   RIGHT   Cancer ([place]) 1977   melanoma . SURGERY RIGHT ARM   Hemorrhoids    Hypertension    Hypertriglyceridemia    Insomnia    Menopausal disorder    Neutropenia (HCC)    Paresthesia    Peripheral neuropathy    Tinea corporis    Vitamin D deficiency    Past Surgical History:  Procedure Laterality Date   ABDOMINAL HYSTERECTOMY  1977   BREAST BIOPSY Bilateral 1977   benign   BREAST BIOPSY Right 06/27/2018   INVASIVE MAMMARY CARCINOMA, NO SPECIAL TYPE. 2 oclock ER/PR negative HER2 positive   BREAST EXCISIONAL BIOPSY Right 07/23/2018   lumpectomy   BREAST LUMPECTOMY Right 2019   [facility]   BREAST LUMPECTOMY WITH SENTINEL LYMPH NODE BIOPSY Right 07/23/2018   6 mm ER/PR negative, Her 2 neu 3+; node negative.  Surgeon: Robert Bellow, MD;  Location: [facility] ORS;  Service: General;  Laterality: Right;   COLONOSCOPY  2007   ELECTROCARDIOGRAM     EYE SURGERY Bilateral    CATARACT EXTRACTIONS   Patient Active Problem List   Diagnosis Date Noted   Lichen sclerosus 78/67/6720   Actinic keratosis 09/21/2018   Malignant neoplasm of upper-inner quadrant of right breast in female, estrogen receptor negative ([place]) 07/11/2018   Benign essential HTN 09/08/2017   Adult BMI 30+ 07/06/2015   Hemorrhoid 07/06/2015   Disorder of peripheral nervous system  07/06/2015   Avitaminosis D 07/06/2015   Vaginal atrophy 07/06/2015   Abnormal LFTs 08/21/2009   Cannot sleep 05/23/2000   Hypertriglyceridemia 05/19/2000    PCP: Miguel Aschoff, MD (but he will be leaving soon)  REFERRING PROVIDER: Miguel Aschoff, MD  REFERRING DIAG: continuous leakage of urine 08/03/22 referral date.  THERAPY DIAG:  Mixed incontinence  Muscle weakness (generalized)  Unsteadiness on feet  Other abnormalities of gait and mobility  Dizziness and giddiness   SUBJECTIVE:        Pt reported she performed PFM contractions while in [place] and it helped a lot, she hasn't had any "gushing" leakage. Pt also walked a lot on vacation. She had a wonderful time on vacation. Pt stated she feels 60-70% better on the GROC scale since starting PHPT.  Pt stated "Kegels" saved her on the plane.  PAIN:  Are you having pain? No NPRS scale: 0/10    PATIENT GOALS She's getting ready to go to [place] (09/13/22-09/19/22) by herself and is very IND but wants to pee less.   OBJECTIVE: Today's treatment  NMR: Access Code: [email or web address] URL: [email or web address] Date: 09/21/2022 Prepared by: Geoffry Paradise  Exercises - Supine Diaphragmatic Breathing  - 1 x daily - 7 x weekly - 2 sets - 5 reps - Supine Pelvic Tilt  - 1 x daily - 7 x weekly - 1 sets - 10 reps - Clamshell with Resistance  - 1 x daily - 3 x weekly - 3 sets - 10 reps (did not perform) - Seated Pelvic Floor Contraction  - 1-2 x daily - 7 x weekly - 1 sets - 10 reps - Sit to Stand with Pelvic Floor Contraction  - 1 x daily - 7 x weekly - 1 sets - 10 reps cues and demo to improve coordination with breath and reduce compensatory strategies in seated (glutes and B hip add).                FOTO: urinary problems: 50 with higher scores indicating better function.  Pt answered as  she felt prior to PHPT.       SELF CARE: PATIENT EDUCATION:  Education details: PT discussed goal progress and reducing PT frequency to every other week based on progress. PT educated pt on FOTO score meaning. PT progressed HEP. Person educated: Patient Education method: Explanation, Demonstration, Verbal cues, and Handouts Education comprehension: verbalized understanding and returned demonstration   HOME EXERCISE PROGRAM: Medbridge: [email or web address]  ASSESSMENT:  CLINICAL IMPRESSION: Pt demonstrated progress as she met all STGs. Pt's FOTO score (urinary issues) indicated urinary issues had a moderate impact on pt's function and QOL prior to PT. Pt also reported a 60-70% improvement in urinary issues since starting PT. She also decr. Nocturia from 4x/night to 1-2x/night. Pt continues to experience leakage (not small amounts vs. Gushing and saturated pads), urgency (but is able to perform PFM contractions to minimize urge), incr. Frequency, impaired posture, and decr. Strength. PT reducing frequency from every week to every other week based on progress.  Pt would continue to benefit from skilled PT to improve deficits listed above during all ADLs.      GOALS: Goals reviewed with patient? Yes  SHORT TERM GOALS: Target date: 10/19/2022  Pt will be IND in HEP to improve strength, balance, flexibility during all ADLs. Baseline: Goal status: MET  2.  Complete FOTO and write goal prn. Baseline:  Goal status: MET  3.  Complete spine and pelvic exam and write goals as indicated. Baseline:  Goal status: MET  4.  Pt will demo proper toileting posture and verbalize technique to reduce bladder irritability and fully empty bladder. Baseline:  Goal status: MET   LONG TERM GOALS: Target date: 11/16/2022   Pt will demonstrate improved coordination of pelvic floor muscles (PFM) with breath in order to report no leakage over the last two weeks to improve QOL and safety. Baseline:  Goal status:  INITIAL  2.  Pt will amb. 200' over even/uneven terrain without leakage, MOD I level, in order to safely amb. While trips. Baseline:  Goal status: INITIAL  3.  Pt will demo improved fluid intake (water) and coordination of PFM to only get up 1 or less times a night to void. Baseline:  Goal status: INITIAL  4.  Pt will demo improve SLS (R and L LE) to 5 sec. On  each side to improve balance during gait. Baseline: 1 sec. On each leg with S. Goal status: INITIAL  5. Pt will improve FOTO score from 50 to 57 to improve function and QOL.   Baseline: 50   Goal status: INITIAL   PLAN: PLAN FOR NEXT SESSION: gait and strengthening (progress PFM as tolerated).  Hubert Raatz L, PT 09/21/2022, 11:04 AM  Geoffry Paradise, PT,DPT 09/21/22 11:04 AM Phone: [phone] Fax: [phone]

## 2022-09-22 DIAGNOSIS — E538 Deficiency of other specified B group vitamins: Secondary | ICD-10-CM | POA: Diagnosis not present

## 2022-09-27 ENCOUNTER — Encounter: Payer: PPO | Admitting: Physical Therapy

## 2022-09-28 DIAGNOSIS — E538 Deficiency of other specified B group vitamins: Secondary | ICD-10-CM | POA: Diagnosis not present

## 2022-10-03 ENCOUNTER — Telehealth: Payer: Self-pay | Admitting: Family Medicine

## 2022-10-03 NOTE — Telephone Encounter (Signed)
Copied from [place] [id number]. Topic: Medicare AWV >> Oct 03, 2022  1:12 PM Jae Dire wrote: Reason for CRM:  Left message for patient to call back and schedule Medicare Annual Wellness Visit (AWV) in office.   If unable to come into the office for AWV,  please offer to do virtually or by telephone.  Last AWV: 09/29/2021  Please schedule at anytime with [facility] Health Advisor.  30 minute appointment for Virtual or phone 45 minute appointment for in office or Initial virtual/phone  Any questions, please contact me at [phone]

## 2022-10-04 ENCOUNTER — Encounter: Payer: PPO | Admitting: Physical Therapy

## 2022-10-05 ENCOUNTER — Other Ambulatory Visit: Payer: Self-pay

## 2022-10-05 ENCOUNTER — Ambulatory Visit: Payer: PPO

## 2022-10-05 DIAGNOSIS — M6281 Muscle weakness (generalized): Secondary | ICD-10-CM

## 2022-10-05 DIAGNOSIS — R42 Dizziness and giddiness: Secondary | ICD-10-CM

## 2022-10-05 DIAGNOSIS — R2681 Unsteadiness on feet: Secondary | ICD-10-CM

## 2022-10-05 DIAGNOSIS — N3946 Mixed incontinence: Secondary | ICD-10-CM

## 2022-10-05 DIAGNOSIS — R2689 Other abnormalities of gait and mobility: Secondary | ICD-10-CM

## 2022-10-05 NOTE — Therapy (Signed)
OUTPATIENT PHYSICAL THERAPY FEMALE PELVIC TREATMENT   Patient Name: Kelly Clark MRN: [id number] DOB:June 12, 1929, 89 y.o., female Today's Date: 10/05/2022   PT End of Session - 10/05/22 1112     Visit Number 5    Number of Visits 9    Date for PT Re-Evaluation 10/23/22    Authorization Type Medicare    Progress Note Due on Visit 10    PT Start Time 1110   pt late   PT Stop Time 1135    PT Time Calculation (min) 25 min    Activity Tolerance Patient tolerated treatment well    Behavior During Therapy [facility] for tasks assessed/performed              Past Medical History:  Diagnosis Date   Basal cell carcinoma 2019   RIGHT THUMB REGION   Breast cancer ([place]) 06/27/2018   RIGHT   Cancer ([place]) 1977   melanoma . SURGERY RIGHT ARM   Hemorrhoids    Hypertension    Hypertriglyceridemia    Insomnia    Menopausal disorder    Neutropenia (HCC)    Paresthesia    Peripheral neuropathy    Tinea corporis    Vitamin D deficiency    Past Surgical History:  Procedure Laterality Date   ABDOMINAL HYSTERECTOMY  1977   BREAST BIOPSY Bilateral 1977   benign   BREAST BIOPSY Right 06/27/2018   INVASIVE MAMMARY CARCINOMA, NO SPECIAL TYPE. 2 oclock ER/PR negative HER2 positive   BREAST EXCISIONAL BIOPSY Right 07/23/2018   lumpectomy   BREAST LUMPECTOMY Right 2019   [facility]   BREAST LUMPECTOMY WITH SENTINEL LYMPH NODE BIOPSY Right 07/23/2018   6 mm ER/PR negative, Her 2 neu 3+; node negative.  Surgeon: Robert Bellow, MD;  Location: [facility] ORS;  Service: General;  Laterality: Right;   COLONOSCOPY  2007   ELECTROCARDIOGRAM     EYE SURGERY Bilateral    CATARACT EXTRACTIONS   Patient Active Problem List   Diagnosis Date Noted   Lichen sclerosus 40/98/1191   Actinic keratosis 09/21/2018   Malignant neoplasm of upper-inner quadrant of right breast in female, estrogen receptor negative ([place]) 07/11/2018   Benign essential HTN 09/08/2017   Adult BMI 30+ 07/06/2015   Hemorrhoid 07/06/2015    Disorder of peripheral nervous system 07/06/2015   Avitaminosis D 07/06/2015   Vaginal atrophy 07/06/2015   Abnormal LFTs 08/21/2009   Cannot sleep 05/23/2000   Hypertriglyceridemia 05/19/2000    PCP: Miguel Aschoff, MD (but he will be leaving soon)  REFERRING PROVIDER: Miguel Aschoff, MD  REFERRING DIAG: continuous leakage of urine 08/03/22 referral date.  THERAPY DIAG:  Mixed incontinence  Muscle weakness (generalized)  Unsteadiness on feet  Other abnormalities of gait and mobility  Dizziness and giddiness   SUBJECTIVE:        Pt reported she's been still getting up less at night. [place]ed [place]5 miles)  and was able to prevent a gushing episode with PFM contractions. Pt has been taking B12 shots which decr. Her energy in the afternoon. Pt is gong hiking this afternoon with her dtr. She feels like she can be done today as she's doing. Pt reported she's approx. 80% better since starting PT on GROC. Pt stated PT has also helped sclerosis at introitus.   PAIN:  Are you having pain? No NPRS scale: 0/10    PATIENT GOALS She's getting ready to go to [place] (09/13/22-09/19/22) by herself and is very IND but  wants to pee less.   OBJECTIVE: Today's treatment  SELF CARE: as review only today Access Code: [email or web address] URL: [email or web address] Date: 09/21/2022 Prepared by: Geoffry Paradise  REVIEW ONLY Exercises - Supine Diaphragmatic Breathing  - 1 x daily - 7 x weekly - 2 sets - 5 reps - Supine Pelvic Tilt  - 1 x daily - 7 x weekly - 1 sets - 10 reps - Clamshell with Resistance  - 1 x daily - 3 x weekly - 3 sets - 10 reps (did not perform) - Seated Pelvic Floor Contraction  - 1-2 x daily - 7 x weekly - 1 sets - 10 reps - Sit to Stand with Pelvic Floor Contraction  - 1 x daily - 7 x weekly - 1 sets - 10 reps cues and demo to improve coordination with breath and reduce compensatory strategies in seated (glutes and B hip add).                 FOTO:  62 urinary issues.    SELF CARE: PATIENT EDUCATION:  Education details: PT discussed goal progress and d/c today based on pt's wishes. PT reiterated the importance of continuing HEP (and how to progress) to maintain strength gains. PT discussed outcome measure results. Person educated: Patient Education method: Explanation, Demonstration, Verbal cues, and Handouts Education comprehension: verbalized understanding and returned demonstration   HOME EXERCISE PROGRAM: Medbridge: [email or web address]  ASSESSMENT:  CLINICAL IMPRESSION: Pt demonstrated progress as she met or partially met all LTGs and wishes to d/c. Please see d/c summary for details.     GOALS: Goals reviewed with patient? Yes  SHORT TERM GOALS: Target date: 11/02/2022  Pt will be IND in HEP to improve strength, balance, flexibility during all ADLs. Baseline: Goal status: MET  2.  Complete FOTO and write goal prn. Baseline:  Goal status: MET  3.  Complete spine and pelvic exam and write goals as indicated. Baseline:  Goal status: MET  4.  Pt will demo proper toileting posture and verbalize technique to reduce bladder irritability and fully empty bladder. Baseline:  Goal status: MET   LONG TERM GOALS: Target date: 11/30/2022   Pt will demonstrate improved coordination of pelvic floor muscles (PFM) with breath in order to report no leakage over the last two weeks to improve QOL and safety. Baseline:  Goal status: partially met (slight leakage once after long car ride)  2.  Pt will amb. 200' over even/uneven terrain without leakage, MOD I level, in order to safely amb. While trips. Baseline:  Goal status: MET [place] and only urinated twice in 6 hours.  3.  Pt will demo improved fluid intake (water) and coordination of PFM to only get up 1 or less times a night to void. Baseline:  Goal status: PARTIALLY MET, 1-2/NIGHT (USUALLY ONCE) and drinks more water.  4.  Pt  will demo improve SLS (R and L LE) to 5 sec. On each side to improve balance during gait. Baseline: 1 sec. On each leg with S. Goal status: PARTIALLY MET 2-3 sec. B SLS  5. Pt will improve FOTO score from 50 to 57 to improve function and QOL.   Baseline: 50   Goal status: MET   PLAN: PLAN FOR NEXT SESSION: d/c  PHYSICAL THERAPY DISCHARGE SUMMARY  Visits from Start of Care: 5  Current functional level related to goals / functional outcomes:  LONG TERM GOALS: Target date: 11/30/2022   Pt will demonstrate improved coordination of  pelvic floor muscles (PFM) with breath in order to report no leakage over the last two weeks to improve QOL and safety. Baseline:  Goal status: partially met (slight leakage once after long car ride)  2.  Pt will amb. 200' over even/uneven terrain without leakage, MOD I level, in order to safely amb. While trips. Baseline:  Goal status: MET [place] and only urinated twice in 6 hours.  3.  Pt will demo improved fluid intake (water) and coordination of PFM to only get up 1 or less times a night to void. Baseline:  Goal status: PARTIALLY MET, 1-2/NIGHT (USUALLY ONCE) and drinks more water.  4.  Pt will demo improve SLS (R and L LE) to 5 sec. On each side to improve balance during gait. Baseline: 1 sec. On each leg with S. Goal status: PARTIALLY MET 2-3 sec. B SLS  5. Pt will improve FOTO score from 50 to 57 to improve function and QOL.   Baseline: 50   Goal status: MET   Remaining deficits: Intermittent leakage and gets up 1-2/night to void   Education / Equipment: HEP. Pt plans to start yoga again after today.  Patient agrees to discharge. Patient goals were met. Patient is being discharged due to being pleased with the current functional level.  Miller,Jennifer L, PT 10/05/2022, 11:12 AM  Geoffry Paradise, PT,DPT 10/05/22 11:12 AM Phone: [phone] Fax: [phone]

## 2022-10-11 ENCOUNTER — Encounter: Payer: PPO | Admitting: Physical Therapy

## 2022-10-13 ENCOUNTER — Ambulatory Visit (INDEPENDENT_AMBULATORY_CARE_PROVIDER_SITE_OTHER): Payer: PPO

## 2022-10-13 VITALS — Wt 180.0 lb

## 2022-10-13 DIAGNOSIS — Z0000 Encounter for general adult medical examination without abnormal findings: Secondary | ICD-10-CM

## 2022-10-13 NOTE — Progress Notes (Signed)
Virtual Visit via Telephone Note  I connected with  Kelly Clark on 10/13/22 at 11:30 AM EDT by telephone and verified that I am speaking with the correct person using two identifiers.  Location: Patient: home Provider: [facility] Persons participating in the virtual visit: [place]   I discussed the limitations, risks, security and privacy concerns of performing an evaluation and management service by telephone and the availability of in person appointments. The patient expressed understanding and agreed to proceed.  Interactive audio and video telecommunications were attempted between this nurse and patient, however failed, due to patient having technical difficulties OR patient did not have access to video capability.  We continued and completed visit with audio only.  Some vital signs may be absent or patient reported.   Dionisio David, LPN  Subjective:   Kelly Clark is a 88 y.o. female who presents for Medicare Annual (Subsequent) preventive examination.  Review of Systems     Cardiac Risk Factors include: advanced age (>58men, >20 women);hypertension     Objective:    There were no vitals filed for this visit. There is no height or weight on file to calculate BMI.     10/13/2022   11:43 AM 08/24/2022   11:47 AM 09/24/2020    9:50 AM 09/23/2019    8:41 AM 11/02/2018    2:00 PM 09/19/2018    9:59 AM 07/23/2018    1:56 PM  Advanced Directives  Does Patient Have a Medical Advance Directive? No Yes Yes Yes Yes Yes Yes  Type of Armed forces technical officer of [place];Living will [place];Living will  [place];Living will [place];Living will  Does patient want to make changes to medical advance directive?       No - Patient declined  Copy of [place] in Chart?   No - copy requested Yes - validated most recent copy scanned in chart (See row  information)  No - copy requested Yes  Would patient like information on creating a medical advance directive? No - Patient declined          Current Medications (verified) Outpatient Encounter Medications as of 10/13/2022  Medication Sig   amLODipine (NORVASC) 5 MG tablet TAKE 1 TABLET(5 MG) BY MOUTH DAILY   Cholecalciferol (VITAMIN D3) 10000 units TABS Take 10,000 Units by mouth at bedtime.   clobetasol ointment (TEMOVATE) 6.23 % Apply 1 application topically once a week. Applied to affected area of vagina and rectum   cyanocobalamin (VITAMIN B12) 1000 MCG/ML injection Inject 52ml into muscle once a week for 4 weeks and then once a month for 4 months.   gabapentin (NEURONTIN) 800 MG tablet Take by mouth.   latanoprost (XALATAN) 0.005 % ophthalmic solution Place 1 drop into both eyes at bedtime.    losartan (COZAAR) 50 MG tablet TAKE 1 TABLET(50 MG) BY MOUTH DAILY   Multiple Vitamin (MULTIVITAMIN) tablet Take 1 tablet by mouth daily.   multivitamin-lutein (OCUVITE-LUTEIN) CAPS capsule Take 1 capsule by mouth every morning.    polyethylene glycol powder (GLYCOLAX/MIRALAX) 17 GM/SCOOP powder Take 17 g by mouth at bedtime.    RESVERATROL-GRAPE PO Take 1 capsule by mouth daily. CoQ10 Resveratrol & Grape Seed   gabapentin (NEURONTIN) 600 MG tablet Take 1 1/2 Tablets Nightly (Patient not taking: Reported on 10/13/2022)   No facility-administered encounter medications on file as of 10/13/2022.    Allergies (verified) Patient has no  known allergies.   History: Past Medical History:  Diagnosis Date   Basal cell carcinoma 2019   RIGHT THUMB REGION   Breast cancer ([place]) 06/27/2018   RIGHT   Cancer ([place]) 1977   melanoma . SURGERY RIGHT ARM   Hemorrhoids    Hypertension    Hypertriglyceridemia    Insomnia    Menopausal disorder    Neutropenia (HCC)    Paresthesia    Peripheral neuropathy    Tinea corporis    Vitamin D deficiency    Past Surgical History:  Procedure Laterality Date    ABDOMINAL HYSTERECTOMY  1977   BREAST BIOPSY Bilateral 1977   benign   BREAST BIOPSY Right 06/27/2018   INVASIVE MAMMARY CARCINOMA, NO SPECIAL TYPE. 2 oclock ER/PR negative HER2 positive   BREAST EXCISIONAL BIOPSY Right 07/23/2018   lumpectomy   BREAST LUM[facility]OMY Right 2019   [facility]   BREAST LUMPECTOMY WITH SENTINEL LYMPH NODE BIOPSY Right 07/23/2018   6 mm ER/PR negative, Her 2 neu 3+; node negative.  Surgeon: Robert Bellow, MD;  Location: [facility] ORS;  Service: General;  Laterality: Right;   COLONOSCOPY  2007   ELECTROCARDIOGRAM     EYE SURGERY Bilateral    CATARACT EXTRACTIONS   Family History  Problem Relation Age of Onset   Heart attack Mother    Congestive Heart Failure Mother    Stroke Father    Colon cancer Sister 12   Throat cancer Maternal Grandmother 82   Healthy Daughter    Breast cancer Neg Hx    Social History   Socioeconomic History   Marital status: Widowed    Spouse name: Not on file   Number of children: 1   Years of education: H/S   Highest education level: Some college, no degree  Occupational History   Occupation: Retired  Tobacco Use   Smoking status: Never   Smokeless tobacco: Never  Vaping Use   Vaping Use: Never used  Substance and Sexual Activity   Alcohol use: No   Drug use: No   Sexual activity: Not Currently  Other Topics Concern   Not on file  Social History Narrative   Not on file   Social Determinants of Health   Financial Resource Strain: Low Risk  (10/13/2022)   Overall Financial Resource Strain (CARDIA)    Difficulty of Paying Living Expenses: Not hard at all  Food Insecurity: No Food Insecurity (10/13/2022)   Hunger Vital Sign    Worried About Running Out of Food in the Last Year: Never true    [place] in the Last Year: Never true  Transportation Needs: No Transportation Needs (10/13/2022)   PRAPARE - Hydrologist (Medical): No    Lack of Transportation (Non-Medical): No  Physical  Activity: Sufficiently Active (10/13/2022)   Exercise Vital Sign    Days of Exercise per Week: 3 days    Minutes of Exercise per Session: 60 min  Stress: No Stress Concern Present (10/13/2022)   [place]    Feeling of Stress : Not at all  Social Connections: Moderately Integrated (10/13/2022)   Social Connection and Isolation Panel [NHANES]    Frequency of Communication with Friends and Family: More than three times a week    Frequency of Social Gatherings with Friends and Family: More than three times a week    Attends Religious Services: More than 4 times per year    Active  Member of Clubs or Organizations: Yes    Attends Music therapist: More than 4 times per year    Marital Status: Widowed    Tobacco Counseling Counseling given: Not Answered   Clinical Intake:  Pre-visit preparation completed: Yes  Pain : No/denies pain     Nutritional Risks: None Diabetes: No  How often do you need to have someone help you when you read instructions, pamphlets, or other written materials from your doctor or pharmacy?: 1 - Never  Diabetic?no  Interpreter Needed?: No  Information entered by :: Kirke Shaggy, LPN   Activities of Daily Living    10/13/2022   11:43 AM 10/09/2022    2:51 PM  In your present state of health, do you have any difficulty performing the following activities:  Hearing? 0 0  Vision? 0 0  Difficulty concentrating or making decisions? 0 0  Walking or climbing stairs? 0 0  Dressing or bathing? 0 0  Doing errands, shopping? 0 0  Preparing Food and eating ? N N  Using the Toilet? N N  In the past six months, have you accidently leaked urine? Y Y  Do you have problems with loss of bowel control? N N  Managing your Medications? N N  Managing your Finances? N N  Housekeeping or managing your Housekeeping? N N    Patient Care Team: [name]., MD as PCP -  General (Family Medicine) Lorelee Cover., MD as Consulting Physician (Ophthalmology) Oneta Rack, MD as Consulting Physician (Dermatology) Bary Castilla Forest Gleason, MD as Consulting Physician (General Surgery)  Indicate any recent Medical Services you may have received from other than [facility] providers in the past year (date may be approximate).     Assessment:   This is a routine wellness examination for Kelly Clark.  Hearing/Vision screen Hearing Screening - Comments:: No aids Vision Screening - Comments:: Wears glasses- Dr.Bell  Dietary issues and exercise activities discussed: Current Exercise Habits: Home exercise routine, Type of exercise: walking, Time (Minutes): 60, Frequency (Times/Week): 3, Weekly Exercise (Minutes/Week): 180, Intensity: Mild   Goals Addressed             This Visit's Progress    DIET - EAT MORE FRUITS AND VEGETABLES         Depression Screen    10/13/2022   11:40 AM 09/29/2021   10:34 AM 09/24/2020    9:47 AM 09/23/2019    8:44 AM 09/19/2018   10:05 AM 09/19/2018   10:00 AM 07/06/2017   10:39 AM  PHQ 2/9 Scores  PHQ - 2 Score 0 0 0 0 0 0 0  PHQ- 9 Score 0 0   0      Fall Risk    10/13/2022   11:43 AM 10/09/2022    2:51 PM 09/29/2021   10:34 AM 09/24/2020    9:50 AM 07/14/2020    4:02 PM  Fall Risk   Falls in the past year? 0 0 1 0 0  Comment     Emmi Telephone Survey: data to providers prior to load  Number falls in past yr: 0 0 0 0   Injury with Fall? 0  1 0   Risk for fall due to : No Fall Risks      Follow up Falls prevention discussed;Falls evaluation completed        FALL RISK PREVENTION PERTAINING TO THE HOME:  Any stairs in or around the home? Yes  If so, are there any without  handrails? No  Home free of loose throw rugs in walkways, pet beds, electrical cords, etc? Yes  Adequate lighting in your home to reduce risk of falls? Yes   ASSISTIVE DEVICES UTILIZED TO PREVENT FALLS:  Life alert? No  Use of a cane, walker or w/c? No   Grab bars in the bathroom? Yes  Shower chair or bench in shower? No  Elevated toilet seat or a handicapped toilet? No    Cognitive Function:        10/13/2022   11:44 AM 09/24/2020    9:56 AM 09/23/2019    8:50 AM 07/06/2017   10:42 AM 10/05/2016   10:47 AM  6CIT Screen  What Year? 0 points 0 points 0 points 0 points 0 points  What month? 0 points 0 points 0 points 0 points 0 points  What time? 0 points 0 points 0 points 0 points 0 points  Count back from 20 0 points 0 points 0 points 0 points 0 points  Months in reverse 0 points 0 points 0 points 0 points 0 points  Repeat phrase 2 points 2 points 0 points 0 points 0 points  Total Score 2 points 2 points 0 points 0 points 0 points    Immunizations Immunization History  Administered Date(s) Administered   Fluad Quad(high Dose 65+) 09/24/2019, 09/28/2020, 09/29/2021, 09/06/2022   Influenza, High Dose Seasonal PF 09/23/2015, 10/05/2016, 11/01/2017, 09/19/2018   Moderna Sars-Covid-2 Vaccination 12/31/2019, 01/28/2020   Pneumococcal Conjugate-13 08/01/2014   Pneumococcal Polysaccharide-23 06/03/1999   Td 01/18/2007   Tdap 01/28/2022    TDAP status: Up to date  Flu Vaccine status: Up to date  Pneumococcal vaccine status: Up to date  Covid-19 vaccine status: Completed vaccines  Qualifies for Shingles Vaccine? Yes   Zostavax completed No   Shingrix Completed?: No.    Education has been provided regarding the importance of this vaccine. Patient has been advised to call insurance company to determine out of pocket expense if they have not yet received this vaccine. Advised may also receive vaccine at local pharmacy or Health Dept. Verbalized acceptance and understanding.  Screening Tests Health Maintenance  Topic Date Due   Zoster Vaccines- Shingrix (1 of 2) Never done   COVID-19 Vaccine (3 - Moderna risk series) 02/25/2020   Medicare Annual Wellness (AWV)  11/13/2023   TETANUS/TDAP  01/29/2032   Pneumonia Vaccine 50+  Years old  Completed   INFLUENZA VACCINE  Completed   DEXA SCAN  Completed   HPV VACCINES  Aged Out    Health Maintenance  Health Maintenance Due  Topic Date Due   Zoster Vaccines- Shingrix (1 of 2) Never done   COVID-19 Vaccine (3 - Moderna risk series) 02/25/2020    Colorectal cancer screening: No longer required.   Mammogram status: No longer required due to age.  Lung Cancer Screening: (Low Dose CT Chest recommended if Age 75-80 years, 30 pack-year currently smoking OR have quit w/in 15years.) does not qualify.   Additional Screening:  Hepatitis C Screening: does not qualify; Completed no  Vision Screening: Recommended annual ophthalmology exams for early detection of glaucoma and other disorders of the eye. Is the patient up to date with their annual eye exam?  Yes  Who is the provider or what is the name of the office in which the patient attends annual eye exams? Dr.Bell If pt is not established with a provider, would they like to be referred to a provider to establish care? No .   Dental  Screening: Recommended annual dental exams for proper oral hygiene  Community Resource Referral / Chronic Care Management: CRR required this visit?  No   CCM required this visit?  No      Plan:     I have personally reviewed and noted the following in the patient's chart:   Medical and social history Use of alcohol, tobacco or illicit drugs  Current medications and supplements including opioid prescriptions. Patient is not currently taking opioid prescriptions. Functional ability and status Nutritional status Physical activity Advanced directives List of other physicians Hospitalizations, surgeries, and ER visits in previous 12 months Vitals Screenings to include cognitive, depression, and falls Referrals and appointments  In addition, I have reviewed and discussed with patient certain preventive protocols, quality metrics, and best practice recommendations. A written  personalized care plan for preventive services as well as general preventive health recommendations were provided to patient.     Dionisio David, LPN   [id number]   Nurse Notes: none

## 2022-10-13 NOTE — Patient Instructions (Signed)
Kelly Clark , Thank you for taking time to come for your Medicare Wellness Visit. I appreciate your ongoing commitment to your health goals. Please review the following plan we discussed and let me know if I can assist you in the future.   Screening recommendations/referrals: Colonoscopy: aged out Mammogram: aged out Bone Density: aged out Recommended yearly ophthalmology/optometry visit for glaucoma screening and checkup Recommended yearly dental visit for hygiene and checkup  Vaccinations: Influenza vaccine: 09/06/22 Pneumococcal vaccine: 06/03/1999 Tdap vaccine: 01/28/22 Shingles vaccine: n/d   Covid-19:12/31/19, 01/28/20  Advanced directives: no  Conditions/risks identified: none  Next appointment: Follow up in one year for your annual wellness visit 10/16/23 @ 11:30 am by phone   Preventive Care 65 Years and Older, Female Preventive care refers to lifestyle choices and visits with your health care provider that can promote health and wellness. What does preventive care include? A yearly physical exam. This is also called an annual well check. Dental exams once or twice a year. Routine eye exams. Ask your health care provider how often you should have your eyes checked. Personal lifestyle choices, including: Daily care of your teeth and gums. Regular physical activity. Eating a healthy diet. Avoiding tobacco and drug use. Limiting alcohol use. Practicing safe sex. Taking low-dose aspirin every day. Taking vitamin and mineral supplements as recommended by your health care provider. What happens during an annual well check? The services and screenings done by your health care provider during your annual well check will depend on your age, overall health, lifestyle risk factors, and family history of disease. Counseling  Your health care provider may ask you questions about your: Alcohol use. Tobacco use. Drug use. Emotional well-being. Home and relationship  well-being. Sexual activity. Eating habits. History of falls. Memory and ability to understand (cognition). Work and work Statistician. Reproductive health. Screening  You may have the following tests or measurements: Height, weight, and BMI. Blood pressure. Lipid and cholesterol levels. These may be checked every 5 years, or more frequently if you are over 38 years old. Skin check. Lung cancer screening. You may have this screening every year starting at age 51 if you have a 30-pack-year history of smoking and currently smoke or have quit within the past 15 years. Fecal occult blood test (FOBT) of the stool. You may have this test every year starting at age 51. Flexible sigmoidoscopy or colonoscopy. You may have a sigmoidoscopy every 5 years or a colonoscopy every 10 years starting at age 29. Hepatitis C blood test. Hepatitis B blood test. Sexually transmitted disease (STD) testing. Diabetes screening. This is done by checking your blood sugar (glucose) after you have not eaten for a while (fasting). You may have this done every 1-3 years. Bone density scan. This is done to screen for osteoporosis. You may have this done starting at age 54. Mammogram. This may be done every 1-2 years. Talk to your health care provider about how often you should have regular mammograms. Talk with your health care provider about your test results, treatment options, and if necessary, the need for more tests. Vaccines  Your health care provider may recommend certain vaccines, such as: Influenza vaccine. This is recommended every year. Tetanus, diphtheria, and acellular pertussis (Tdap, Td) vaccine. You may need a Td booster every 10 years. Zoster vaccine. You may need this after age 73. Pneumococcal 13-valent conjugate (PCV13) vaccine. One dose is recommended after age 47. Pneumococcal polysaccharide (PPSV23) vaccine. One dose is recommended after age 67. Talk to your  health care provider about which  screenings and vaccines you need and how often you need them. This information is not intended to replace advice given to you by your health care provider. Make sure you discuss any questions you have with your health care provider. Document Released: 01/01/2016 Document Revised: 08/24/2016 Document Reviewed: 10/06/2015 Elsevier Interactive Patient Education  2017 [employer] Prevention in the Home Falls can cause injuries. They can happen to people of all ages. There are many things you can do to make your home safe and to help prevent falls. What can I do on the outside of my home? Regularly fix the edges of walkways and driveways and fix any cracks. Remove anything that might make you trip as you walk through a door, such as a raised step or threshold. Trim any bushes or trees on the path to your home. Use bright outdoor lighting. Clear any walking paths of anything that might make someone trip, such as rocks or tools. Regularly check to see if handrails are loose or broken. Make sure that both sides of any steps have handrails. Any raised decks and porches should have guardrails on the edges. Have any leaves, snow, or ice cleared regularly. Use sand or salt on walking paths during winter. Clean up any spills in your garage right away. This includes oil or grease spills. What can I do in the bathroom? Use night lights. Install grab bars by the toilet and in the tub and shower. Do not use towel bars as grab bars. Use non-skid mats or decals in the tub or shower. If you need to sit down in the shower, use a plastic, non-slip stool. Keep the floor dry. Clean up any water that spills on the floor as soon as it happens. Remove soap buildup in the tub or shower regularly. Attach bath mats securely with double-sided non-slip rug tape. Do not have throw rugs and other things on the floor that can make you trip. What can I do in the bedroom? Use night lights. Make sure that you have a  light by your bed that is easy to reach. Do not use any sheets or blankets that are too big for your bed. They should not hang down onto the floor. Have a firm chair that has side arms. You can use this for support while you get dressed. Do not have throw rugs and other things on the floor that can make you trip. What can I do in the kitchen? Clean up any spills right away. Avoid walking on wet floors. Keep items that you use a lot in easy-to-reach places. If you need to reach something above you, use a strong step stool that has a grab bar. Keep electrical cords out of the way. Do not use floor polish or wax that makes floors slippery. If you must use wax, use non-skid floor wax. Do not have throw rugs and other things on the floor that can make you trip. What can I do with my stairs? Do not leave any items on the stairs. Make sure that there are handrails on both sides of the stairs and use them. Fix handrails that are broken or loose. Make sure that handrails are as long as the stairways. Check any carpeting to make sure that it is firmly attached to the stairs. Fix any carpet that is loose or worn. Avoid having throw rugs at the top or bottom of the stairs. If you do have throw rugs, attach them  to the floor with carpet tape. Make sure that you have a light switch at the top of the stairs and the bottom of the stairs. If you do not have them, ask someone to add them for you. What else can I do to help prevent falls? Wear shoes that: Do not have high heels. Have rubber bottoms. Are comfortable and fit you well. Are closed at the toe. Do not wear sandals. If you use a stepladder: Make sure that it is fully opened. Do not climb a closed stepladder. Make sure that both sides of the stepladder are locked into place. Ask someone to hold it for you, if possible. Clearly mark and make sure that you can see: Any grab bars or handrails. First and last steps. Where the edge of each step  is. Use tools that help you move around (mobility aids) if they are needed. These include: Canes. Walkers. Scooters. Crutches. Turn on the lights when you go into a dark area. Replace any light bulbs as soon as they burn out. Set up your furniture so you have a clear path. Avoid moving your furniture around. If any of your floors are uneven, fix them. If there are any pets around you, be aware of where they are. Review your medicines with your doctor. Some medicines can make you feel dizzy. This can increase your chance of falling. Ask your doctor what other things that you can do to help prevent falls. This information is not intended to replace advice given to you by your health care provider. Make sure you discuss any questions you have with your health care provider. Document Released: 10/01/2009 Document Revised: 05/12/2016 Document Reviewed: 01/09/2015 Elsevier Interactive Patient Education  2017 [employer].

## 2022-10-17 DIAGNOSIS — H40153 Residual stage of open-angle glaucoma, bilateral: Secondary | ICD-10-CM | POA: Diagnosis not present

## 2022-10-18 ENCOUNTER — Encounter: Payer: PPO | Admitting: Physical Therapy

## 2022-10-26 DIAGNOSIS — E538 Deficiency of other specified B group vitamins: Secondary | ICD-10-CM | POA: Diagnosis not present

## 2022-11-14 DIAGNOSIS — R200 Anesthesia of skin: Secondary | ICD-10-CM | POA: Diagnosis not present

## 2022-11-14 DIAGNOSIS — R202 Paresthesia of skin: Secondary | ICD-10-CM | POA: Diagnosis not present

## 2022-11-14 DIAGNOSIS — E538 Deficiency of other specified B group vitamins: Secondary | ICD-10-CM | POA: Diagnosis not present

## 2022-11-18 DIAGNOSIS — G2581 Restless legs syndrome: Secondary | ICD-10-CM | POA: Diagnosis not present

## 2022-11-18 DIAGNOSIS — G609 Hereditary and idiopathic neuropathy, unspecified: Secondary | ICD-10-CM | POA: Diagnosis not present

## 2022-11-18 DIAGNOSIS — E538 Deficiency of other specified B group vitamins: Secondary | ICD-10-CM | POA: Diagnosis not present

## 2022-11-29 DIAGNOSIS — H409 Unspecified glaucoma: Secondary | ICD-10-CM | POA: Diagnosis not present

## 2022-11-29 DIAGNOSIS — E669 Obesity, unspecified: Secondary | ICD-10-CM | POA: Diagnosis not present

## 2022-11-29 DIAGNOSIS — G629 Polyneuropathy, unspecified: Secondary | ICD-10-CM | POA: Diagnosis not present

## 2022-11-29 DIAGNOSIS — R32 Unspecified urinary incontinence: Secondary | ICD-10-CM | POA: Diagnosis not present

## 2022-11-29 DIAGNOSIS — I10 Essential (primary) hypertension: Secondary | ICD-10-CM | POA: Diagnosis not present

## 2022-11-29 DIAGNOSIS — N952 Postmenopausal atrophic vaginitis: Secondary | ICD-10-CM | POA: Diagnosis not present

## 2022-11-29 DIAGNOSIS — Z9849 Cataract extraction status, unspecified eye: Secondary | ICD-10-CM | POA: Diagnosis not present

## 2022-11-29 DIAGNOSIS — E559 Vitamin D deficiency, unspecified: Secondary | ICD-10-CM | POA: Diagnosis not present

## 2022-11-29 DIAGNOSIS — G620 Drug-induced polyneuropathy: Secondary | ICD-10-CM | POA: Diagnosis not present

## 2022-11-29 DIAGNOSIS — G8929 Other chronic pain: Secondary | ICD-10-CM | POA: Diagnosis not present

## 2022-11-29 DIAGNOSIS — L409 Psoriasis, unspecified: Secondary | ICD-10-CM | POA: Diagnosis not present

## 2022-11-30 DIAGNOSIS — E538 Deficiency of other specified B group vitamins: Secondary | ICD-10-CM | POA: Diagnosis not present

## 2022-12-05 DIAGNOSIS — H0011 Chalazion right upper eyelid: Secondary | ICD-10-CM | POA: Diagnosis not present

## 2022-12-23 ENCOUNTER — Other Ambulatory Visit: Payer: Self-pay | Admitting: General Surgery

## 2022-12-23 DIAGNOSIS — Z1231 Encounter for screening mammogram for malignant neoplasm of breast: Secondary | ICD-10-CM

## 2022-12-26 DIAGNOSIS — H16223 Keratoconjunctivitis sicca, not specified as Sjogren's, bilateral: Secondary | ICD-10-CM | POA: Diagnosis not present

## 2022-12-28 DIAGNOSIS — E538 Deficiency of other specified B group vitamins: Secondary | ICD-10-CM | POA: Diagnosis not present

## 2023-01-04 DIAGNOSIS — L728 Other follicular cysts of the skin and subcutaneous tissue: Secondary | ICD-10-CM | POA: Diagnosis not present

## 2023-01-04 DIAGNOSIS — D2261 Melanocytic nevi of right upper limb, including shoulder: Secondary | ICD-10-CM | POA: Diagnosis not present

## 2023-01-04 DIAGNOSIS — L821 Other seborrheic keratosis: Secondary | ICD-10-CM | POA: Diagnosis not present

## 2023-01-04 DIAGNOSIS — D2262 Melanocytic nevi of left upper limb, including shoulder: Secondary | ICD-10-CM | POA: Diagnosis not present

## 2023-01-04 DIAGNOSIS — L309 Dermatitis, unspecified: Secondary | ICD-10-CM | POA: Diagnosis not present

## 2023-01-04 DIAGNOSIS — D225 Melanocytic nevi of trunk: Secondary | ICD-10-CM | POA: Diagnosis not present

## 2023-01-31 ENCOUNTER — Ambulatory Visit
Admission: RE | Admit: 2023-01-31 | Discharge: 2023-01-31 | Disposition: A | Discharge status: Home / Self Care | Payer: PPO | Source: Ambulatory Visit | Attending: General Surgery | Admitting: General Surgery

## 2023-01-31 DIAGNOSIS — Z1231 Encounter for screening mammogram for malignant neoplasm of breast: Secondary | ICD-10-CM | POA: Diagnosis not present

## 2023-02-01 DIAGNOSIS — E538 Deficiency of other specified B group vitamins: Secondary | ICD-10-CM | POA: Diagnosis not present

## 2023-02-07 NOTE — Progress Notes (Unsigned)
I,Joseline E Rosas,acting as a scribe for [employer], MD.,have documented all relevant documentation on the behalf of Eulis Foster, MD,as directed by  Eulis Foster, MD while in the presence of Eulis Foster, MD.   Established patient visit   Patient: Kelly Clark   DOB: 21-Mar-1929   89 y.o. Female  MRN: [id number] Visit Date: 02/08/2023  Today's healthcare provider: Eulis Foster, MD   Chief Complaint  Patient presents with   Follow-up chronic disease   Subjective    HPI   Hypertension, follow-up  BP Readings from Last 3 Encounters:  02/08/23 (!) 157/81  08/03/22 134/84  03/30/22 (!) 151/82   Wt Readings from Last 3 Encounters:  02/08/23 179 lb 4.8 oz (81.3 kg)  10/13/22 180 lb (81.6 kg)  08/03/22 180 lb (81.6 kg)     She was last seen for hypertension 6 months ago.  BP at that visit was 134/84. Management since that visit includes no changes. Currently on Amlodipine 5 mg and Losartan 50 mg  She reports excellent compliance with treatment. She declines offer to start increased dose of amlodipine 33m due to previously feeling dizzy on this medication dose    Outside blood pressures are 136-175/80-94. Symptoms: No chest pain No chest pressure  No palpitations No syncope  No dyspnea No orthopnea  No paroxysmal nocturnal dyspnea No lower extremity edema    Lipid/Cholesterol, Follow-up  Last lipid panel Other pertinent labs  Lab Results  Component Value Date   CHOL 149 09/22/2021   HDL 47 09/22/2021   LDLCALC 77 09/22/2021   TRIG 146 09/22/2021   CHOLHDL 3.2 09/22/2021   Lab Results  Component Value Date   ALT 15 08/16/2022   AST 19 08/16/2022   PLT 157 08/16/2022   TSH 1.87 08/16/2022     She was last seen for this 6 months ago.  Management since that visit includes none. She is not on cholesterol lowering medication.  She reports excellent compliance with treatment.   Symptoms:     No dyspnea No lower extremity edema  No numbness or tingling of extremity No orthopnea  No palpitations No paroxysmal nocturnal dyspnea  No speech difficulty No syncope     The ASCVD Risk score (Arnett DK, et al., 2019) failed to calculate for the following reasons:   The 2019 ASCVD risk score is only valid for ages 428to 770 --------------------------------------------------------------------------------------------------- Health update  States that she was treated with vitamin B12 injections which helped her neuropathy symptoms tremendously  She often soaks her feet on epsom salt  She has been taking the 8016mgabapentin at nighttime    Medications: Outpatient Medications Prior to Visit  Medication Sig   amLODipine (NORVASC) 5 MG tablet TAKE 1 TABLET(5 MG) BY MOUTH DAILY   Cholecalciferol (VITAMIN D3) 10000 units TABS Take 10,000 Units by mouth at bedtime.   clobetasol ointment (TEMOVATE) [id number] Apply 1 application topically once a week. Applied to affected area of vagina and rectum   cyanocobalamin (VITAMIN B12) 1000 MCG/ML injection Inject 33m49mnto muscle once a week for 4 weeks and then once a month for 4 months.   gabapentin (NEURONTIN) 800 MG tablet Take by mouth.   latanoprost (XALATAN) 0.005 % ophthalmic solution Place 1 drop into both eyes at bedtime.    Multiple Vitamin (MULTIVITAMIN) tablet Take 1 tablet by mouth daily.   multivitamin-lutein (OCUVITE-LUTEIN) CAPS capsule Take 1 capsule by mouth every morning.    polyethylene glycol powder (GLYCOLAX/MIRALAX)  17 GM/SCOOP powder Take 17 g by mouth at bedtime.    RESVERATROL-GRAPE PO Take 1 capsule by mouth daily. CoQ10 Resveratrol & Grape Seed   [DISCONTINUED] losartan (COZAAR) 50 MG tablet TAKE 1 TABLET(50 MG) BY MOUTH DAILY   [DISCONTINUED] gabapentin (NEURONTIN) 600 MG tablet Take 1 1/2 Tablets Nightly (Patient not taking: Reported on 10/13/2022)   No facility-administered medications prior to visit.    Review of  Systems  {Labs  Heme  Chem  Endocrine  Serology  Results Review (optional):23779}   Objective    BP (!) 157/81 (BP Location: Left Arm, Patient Position: Sitting, Cuff Size: Normal)   Pulse 72   Temp 98.1 F (36.7 C) (Oral)   Resp 16   Ht 5' 2"$  (1.575 m)   Wt 179 lb 4.8 oz (81.3 kg)   BMI 32.79 kg/m  {Show previous vital signs (optional):23777}  Physical Exam Vitals reviewed.  Constitutional:      General: She is not in acute distress.    Appearance: Normal appearance. She is not ill-appearing, toxic-appearing or diaphoretic.  Eyes:     Conjunctiva/sclera: Conjunctivae normal.  Cardiovascular:     Rate and Rhythm: Normal rate and regular rhythm.     Pulses: Normal pulses.     Heart sounds: Normal heart sounds. No murmur heard.    No friction rub. No gallop.  Pulmonary:     Effort: Pulmonary effort is normal. No respiratory distress.     Breath sounds: Normal breath sounds. No stridor. No wheezing, rhonchi or rales.  Abdominal:     General: Bowel sounds are normal. There is no distension.     Palpations: Abdomen is soft.     Tenderness: There is no abdominal tenderness.  Musculoskeletal:     Right lower leg: No edema.     Left lower leg: No edema.  Skin:    Findings: No erythema or rash.  Neurological:     Mental Status: She is alert and oriented to person, place, and time.     ***  Results for orders placed or performed in visit on 02/08/23  Iron, TIBC and Ferritin Panel  Result Value Ref Range   Ferritin 67    Iron 130    TIBC 329.1    %SAT 40   CBC and differential  Result Value Ref Range   Hemoglobin 13.9 12.0 - 16.0   HCT 42 36 - 46   Platelets 157 150 - 400 K/uL   WBC 4.3   CBC  Result Value Ref Range   RBC 4.47 3.87 - [id number]  Basic metabolic panel  Result Value Ref Range   Glucose 82    BUN 14 4 - 21   CO2 28 (A) 13 - 22   Creatinine 0.9 0.5 - 1.1   Potassium 3.9 3.5 - 5.1 mEq/L   Sodium 141 137 - 147   Chloride 107 99 - 108   Comprehensive metabolic panel  Result Value Ref Range   Calcium 9.3 8.7 - 10.7   Albumin 4.2 3.5 - 5.0  Hepatic function panel  Result Value Ref Range   Alkaline Phosphatase 50 25 - 125   ALT 15 7 - 35 U/L   AST 19 13 - 35   Bilirubin, Total 0.6   Vitamin B12  Result Value Ref Range   Vitamin B-12 184   Hemoglobin A1c  Result Value Ref Range   Hemoglobin A1C 5.8   TSH  Result Value Ref Range   TSH 1.87  0.41 - 5.90    Assessment & Plan     Problem List Items Addressed This Visit       Cardiovascular and Mediastinum   Benign essential HTN - Primary    Chronic  Not at goal, BP elevated in office today  Patient is asymptomatic  Will increase losartan to 165m daily and continue amlodipine 565mdaily  She will follow up in 2 weeks for BMP and check magnesium as she has been taking supplements      Relevant Medications   losartan (COZAAR) 100 MG tablet     Other   Hypertriglyceridemia    Chronic  She is not on cholesterol lowering medication as of yet  Recommend dietary management given advanced age  The ASCVD Risk score (Arnett DK, et al., 2019) failed to calculate for the following reasons:   The 2019 ASCVD risk score is only valid for ages 4044o 7966     Relevant Medications   losartan (COZAAR) 100 MG tablet   Class 1 obesity with body mass index (BMI) of 30.0 to 30.9 in adult     Return in about 2 weeks (around 02/22/2023) for HTN.        The entirety of the information documented in the History of Present Illness, Review of Systems and Physical Exam were personally obtained by me. Portions of this information were initially documented by JoLyndel PleasureCMA and reviewed by me for thoroughness and accuracy.MaEulis FosterMD     MaEulis FosterMD  [phone]) [phone])  [place]

## 2023-02-08 ENCOUNTER — Encounter: Payer: Self-pay | Admitting: Family Medicine

## 2023-02-08 ENCOUNTER — Ambulatory Visit (INDEPENDENT_AMBULATORY_CARE_PROVIDER_SITE_OTHER): Payer: PPO | Admitting: Family Medicine

## 2023-02-08 VITALS — BP 157/81 | HR 72 | Temp 98.1°F | Resp 16 | Ht 62.0 in | Wt 179.3 lb

## 2023-02-08 DIAGNOSIS — E559 Vitamin D deficiency, unspecified: Secondary | ICD-10-CM

## 2023-02-08 DIAGNOSIS — Z6830 Body mass index (BMI) 30.0-30.9, adult: Secondary | ICD-10-CM

## 2023-02-08 DIAGNOSIS — I10 Essential (primary) hypertension: Secondary | ICD-10-CM | POA: Diagnosis not present

## 2023-02-08 DIAGNOSIS — E781 Pure hyperglyceridemia: Secondary | ICD-10-CM | POA: Diagnosis not present

## 2023-02-08 DIAGNOSIS — E6609 Other obesity due to excess calories: Secondary | ICD-10-CM

## 2023-02-08 MED ORDER — LOSARTAN POTASSIUM 100 MG PO TABS: 100.0000 mg | ORAL_TABLET | Freq: Every day | ORAL | 1 refills | Status: DC

## 2023-02-08 NOTE — Assessment & Plan Note (Signed)
Chronic  She is not on cholesterol lowering medication as of yet  Recommend dietary management given advanced age  The ASCVD Risk score (Arnett DK, et al., 2019) failed to calculate for the following reasons:   The 2019 ASCVD risk score is only valid for ages 94 to 45

## 2023-02-08 NOTE — Patient Instructions (Signed)
It was a pleasure to see you today!  Thank you for choosing [facility] for your primary care.   Kelly Clark was seen for blood pressure.   Our plans for today were: High blood pressure: Please start taking the losartan 100 mg daily. I have written a prescription for you to have a 100 mg tablet but if this takes some time to be filled, it is ok to take 2 50-mg tablets until your new prescription is ready. Please continue to measure your blood pressure daily after taking your medications with goal of blood pressure less than 140/90.    You should return to our clinic in 2 weeks for blood pressure.   Best Wishes,   Dr. Quentin Cornwall

## 2023-02-08 NOTE — Assessment & Plan Note (Signed)
Chronic  Not at goal, BP elevated in office today  Patient is asymptomatic  Will increase losartan to 157m daily and continue amlodipine 587mdaily  She will follow up in 2 weeks for BMP and check magnesium as she has been taking supplements

## 2023-02-09 DIAGNOSIS — Z1231 Encounter for screening mammogram for malignant neoplasm of breast: Secondary | ICD-10-CM | POA: Diagnosis not present

## 2023-02-09 DIAGNOSIS — Z853 Personal history of malignant neoplasm of breast: Secondary | ICD-10-CM | POA: Diagnosis not present

## 2023-02-13 ENCOUNTER — Telehealth: Payer: Self-pay | Admitting: Family Medicine

## 2023-02-13 NOTE — Telephone Encounter (Signed)
[facility] pharmacy requesting refill.  Prescription listed as '100mg'$  in chart previous but the refill is requesting '50mg'$  losartan (COZAAR) 100 MG tablet  Please advise

## 2023-02-14 ENCOUNTER — Other Ambulatory Visit: Payer: Self-pay | Admitting: Family Medicine

## 2023-02-14 DIAGNOSIS — H40153 Residual stage of open-angle glaucoma, bilateral: Secondary | ICD-10-CM | POA: Diagnosis not present

## 2023-02-14 NOTE — Telephone Encounter (Signed)
Refills for '100mg'$  losartan were sent on 02/08/23.   She will not need '50mg'$  losartan refills.   Eulis Foster, MD  [facility]

## 2023-02-14 NOTE — Telephone Encounter (Signed)
Please Review

## 2023-02-27 NOTE — Progress Notes (Unsigned)
I,Joseline E Rosas,acting as a scribe for [employer], MD.,have documented all relevant documentation on the behalf of Eulis Foster, MD,as directed by  Eulis Foster, MD while in the presence of Eulis Foster, MD.   Established patient visit   Patient: Kelly Clark   DOB: 07-18-1929   89 y.o. Female  MRN: [id number] Visit Date: 02/28/2023  Today's healthcare provider: Eulis Foster, MD   Chief Complaint  Patient presents with   Follow-up   Subjective    HPI  Hypertension, follow-up  BP Readings from Last 3 Encounters:  02/28/23 (!) 172/90  02/08/23 (!) 157/81  08/03/22 134/84   Wt Readings from Last 3 Encounters:  02/08/23 179 lb 4.8 oz (81.3 kg)  10/13/22 180 lb (81.6 kg)  08/03/22 180 lb (81.6 kg)     She was last seen for hypertension 2 weeks ago.  BP at that visit was 157/81. Management since that visit includes increase losartan to '100mg'$  daily and continue amlodipine '5mg'$  daily .  She reports excellent compliance with treatment. She is having side effects. lightheaded  Outside blood pressures are 139-163/79-93 this morning it was 163/93 P 64 Symptoms: No chest pain No chest pressure  No palpitations No syncope  No dyspnea No orthopnea  No paroxysmal nocturnal dyspnea No lower extremity edema   Pertinent labs Lab Results  Component Value Date   CHOL 149 09/22/2021   HDL 47 09/22/2021   LDLCALC 77 09/22/2021   TRIG 146 09/22/2021   CHOLHDL 3.2 09/22/2021   Lab Results  Component Value Date   NA 141 08/16/2022   K 3.9 08/16/2022   CREATININE 0.9 08/16/2022   EGFR 58 (L) 09/22/2021   GLUCOSE 100 (H) 09/22/2021   TSH 1.87 08/16/2022     The ASCVD Risk score (Arnett DK, et al., 2019) failed to calculate for the following reasons:   The 2019 ASCVD risk score is only valid for ages 53 to 56  ---------------------------------------------------------------------------------------------------    Balance issues  Patient would like to see ENT specialist because of her difficulties with balance  She states that she often walks towards the right  This has worsened since chagning her BP medication  She rpeorts she has been seen by PT in the past for this problem with balanace Also reports having difficulty hearing that has gotten worsen over the last few months    Medications: Outpatient Medications Prior to Visit  Medication Sig   amLODipine (NORVASC) 5 MG tablet TAKE 1 TABLET(5 MG) BY MOUTH DAILY   Cholecalciferol (VITAMIN D3) 10000 units TABS Take 10,000 Units by mouth at bedtime.   clobetasol ointment (TEMOVATE) [id number] % Apply 1 application topically once a week. Applied to affected area of vagina and rectum   cyanocobalamin (VITAMIN B12) 1000 MCG/ML injection Inject 65m into muscle once a week for 4 weeks and then once a month for 4 months.   gabapentin (NEURONTIN) 800 MG tablet Take by mouth.   latanoprost (XALATAN) 0.005 % ophthalmic solution Place 1 drop into both eyes at bedtime.    Multiple Vitamin (MULTIVITAMIN) tablet Take 1 tablet by mouth daily.   multivitamin-lutein (OCUVITE-LUTEIN) CAPS capsule Take 1 capsule by mouth every morning.    polyethylene glycol powder (GLYCOLAX/MIRALAX) 17 GM/SCOOP powder Take 17 g by mouth at bedtime.    RESVERATROL-GRAPE PO Take 1 capsule by mouth daily. CoQ10 Resveratrol & Grape Seed   [DISCONTINUED] losartan (COZAAR) 100 MG tablet Take 1 tablet (100 mg total) by mouth daily.  No facility-administered medications prior to visit.    Review of Systems     Objective    BP (!) 172/90 (BP Location: Left Arm, Patient Position: Sitting, Cuff Size: Normal) Comment: pt's machine  Pulse 76   Temp 98.1 F (36.7 C) (Oral)   Resp 16   SpO2 97%    Physical Exam Vitals reviewed.  Constitutional:      General: She is not in acute distress.    Appearance: Normal appearance. She is not ill-appearing, toxic-appearing or diaphoretic.   Eyes:     Conjunctiva/sclera: Conjunctivae normal.  Neck:     Thyroid: No thyroid mass, thyromegaly or thyroid tenderness.     Vascular: No carotid bruit.  Cardiovascular:     Rate and Rhythm: Normal rate and regular rhythm.     Pulses: Normal pulses.     Heart sounds: Normal heart sounds. No murmur heard.    No friction rub. No gallop.  Pulmonary:     Effort: Pulmonary effort is normal. No respiratory distress.     Breath sounds: Normal breath sounds. No stridor. No wheezing, rhonchi or rales.  Abdominal:     General: Bowel sounds are normal. There is no distension.     Palpations: Abdomen is soft.     Tenderness: There is no abdominal tenderness.  Musculoskeletal:     Right lower leg: No edema.     Left lower leg: No edema.  Lymphadenopathy:     Cervical: No cervical adenopathy.  Skin:    Findings: No erythema or rash.  Neurological:     Mental Status: She is alert and oriented to person, place, and time.     Coordination: Coordination normal. Finger-Nose-Finger Test and Heel to [facility] Test normal.     Gait: Gait abnormal and tandem walk abnormal.     Comments: Patient has normal EOM bilaterally without nystagmus      No results found for any visits on 02/28/23.  Assessment & Plan     Problem List Items Addressed This Visit       Cardiovascular and Mediastinum   Benign essential HTN - Primary    Chronic  BP not at goal, elevated  Recommended decreasing to '50mg'$  losartan due to report of worsened dizziness, will add hydralazine '10mg'$  twice daily, she will continue amlodipine '5mg'$  daily  BMP and mag ordered today after adjustment of BP medications and to check mag as patient previously reported being on supplemental mag       Relevant Medications   losartan (COZAAR) 50 MG tablet   hydrALAZINE (APRESOLINE) 10 MG tablet   Other Relevant Orders   Basic Metabolic Panel (BMET)   Magnesium     Other   Decreased hearing of both ears    Chronic  Worsening  Recommended  she discuss with ENT for audiology evaluation       Abnormality of gait due to impairment of balance    Chronic issues  Recently worsened  BP medication adjusted to prior dose for losartan  Patient states she has previously been evaluated by PT  She would like to be evaluated by  ENT for her issues with balance  Initially requested a referral but patient later states she has established with ENT and can call to make an appointment         Return in about 2 weeks (around 03/14/2023) for HTN.        The entirety of the information documented in the History of Present Illness, Review of  Systems and Physical Exam were personally obtained by me. Portions of this information were initially documented by Lyndel Pleasure, CMA . I, Eulis Foster, MD have reviewed the documentation above for thoroughness and accuracy.      Eulis Foster, MD  [facility] [phone] (phone) [phone] (fax)  [place]

## 2023-02-28 ENCOUNTER — Ambulatory Visit (INDEPENDENT_AMBULATORY_CARE_PROVIDER_SITE_OTHER): Payer: PPO | Admitting: Family Medicine

## 2023-02-28 ENCOUNTER — Encounter: Payer: Self-pay | Admitting: Family Medicine

## 2023-02-28 VITALS — BP 172/90 | HR 76 | Temp 98.1°F | Resp 16

## 2023-02-28 DIAGNOSIS — R2689 Other abnormalities of gait and mobility: Secondary | ICD-10-CM

## 2023-02-28 DIAGNOSIS — I10 Essential (primary) hypertension: Secondary | ICD-10-CM | POA: Diagnosis not present

## 2023-02-28 DIAGNOSIS — H9193 Unspecified hearing loss, bilateral: Secondary | ICD-10-CM

## 2023-02-28 MED ORDER — LOSARTAN POTASSIUM 50 MG PO TABS: 50.0000 mg | ORAL_TABLET | Freq: Every day | ORAL | 1 refills | Status: DC

## 2023-02-28 MED ORDER — HYDRALAZINE HCL 10 MG PO TABS: 10.0000 mg | ORAL_TABLET | Freq: Two times a day (BID) | ORAL | 2 refills | Status: DC

## 2023-02-28 NOTE — Patient Instructions (Addendum)
Please start taking '50mg'$  of the Losartan again due to worsened symptoms with your dizziness   Please continue amlodipine '5mg'$  and losartan '50mg'$  along with the hydralazine that has been prescribed.   I have submitted a referral to a ear, nose and throat doctor due to balance concerns and your hearing changes. Please be on the lookout for a call from them in the next 2 weeks   I have prescribed hydralazine '10mg'$  twice daily for your blood pressure.   Please stop this medication if you have worsening dizziness or feel that you may pass out or your blood pressure is less than 100/60.   Please see me in 2 weeks for blood pressure

## 2023-02-28 NOTE — Assessment & Plan Note (Signed)
Chronic  BP not at goal, elevated  Recommended decreasing to '50mg'$  losartan due to report of worsened dizziness, will add hydralazine '10mg'$  twice daily, she will continue amlodipine '5mg'$  daily  BMP and mag ordered today after adjustment of BP medications and to check mag as patient previously reported being on supplemental mag

## 2023-02-28 NOTE — Assessment & Plan Note (Signed)
Chronic issues  Recently worsened  BP medication adjusted to prior dose for losartan  Patient states she has previously been evaluated by PT  She would like to be evaluated by  ENT for her issues with balance  Initially requested a referral but patient later states she has established with ENT and can call to make an appointment

## 2023-02-28 NOTE — Assessment & Plan Note (Signed)
Chronic  Worsening  Recommended she discuss with ENT for audiology evaluation

## 2023-03-01 LAB — BASIC METABOLIC PANEL
BUN/Creatinine Ratio: 17 (ref 12–28)
BUN: 17 mg/dL (ref 10–36)
CO2: 25 mmol/L (ref 20–29)
Calcium: 9.9 mg/dL (ref 8.7–10.3)
Chloride: 103 mmol/L (ref 96–106)
Creatinine, Ser: 0.99 mg/dL (ref 0.57–1.00)
Glucose: 89 mg/dL (ref 70–99)
Potassium: 4 mmol/L (ref 3.5–5.2)
Sodium: 143 mmol/L (ref 134–144)
eGFR: 54 mL/min/{1.73_m2} — ABNORMAL LOW (ref >59)

## 2023-03-01 LAB — MAGNESIUM: Magnesium: 2.6 mg/dL — ABNORMAL HIGH (ref 1.6–2.3)

## 2023-03-15 DIAGNOSIS — H903 Sensorineural hearing loss, bilateral: Secondary | ICD-10-CM | POA: Diagnosis not present

## 2023-03-15 DIAGNOSIS — H8112 Benign paroxysmal vertigo, left ear: Secondary | ICD-10-CM | POA: Diagnosis not present

## 2023-03-24 ENCOUNTER — Ambulatory Visit (INDEPENDENT_AMBULATORY_CARE_PROVIDER_SITE_OTHER): Payer: PPO | Admitting: Family Medicine

## 2023-03-24 ENCOUNTER — Encounter: Payer: Self-pay | Admitting: Family Medicine

## 2023-03-24 VITALS — BP 143/75 | HR 70 | Wt 179.6 lb

## 2023-03-24 DIAGNOSIS — I10 Essential (primary) hypertension: Secondary | ICD-10-CM | POA: Diagnosis not present

## 2023-03-24 DIAGNOSIS — R2689 Other abnormalities of gait and mobility: Secondary | ICD-10-CM

## 2023-03-24 NOTE — Assessment & Plan Note (Signed)
Chronic  BP improved today  Home BP ranges with a 4 elevated measurements greater than 150/90 Patient reports doing well  Will continue current medication regimen including hydralazine 10mg  twice daily, amlodipine 5mg  and losartan 50mg 

## 2023-03-24 NOTE — Progress Notes (Signed)
Established patient visit   Patient: Kelly Clark   DOB: Jul 06, 1929   89 y.o. Female  MRN: [id number] Visit Date: 03/24/2023  Today's healthcare provider: Ronnald Ramp, MD   No chief complaint on file.  Subjective    HPI  HTN: patient presents for follow up for elevated BP  She has recording from home BP ranging from 140-175 systolic and 70-90s diastolic  She has been adherent to amlodipine 5mg , hydralazine 10mg  twice daily and losartan 50mg  daily   Medication management Dr. Verdie Drown for gabapentin 800mg , she will need prescription renewed in aug 2024, reocmmended that she request refill closer to August date  Hearing, decreased  Dizziness : recommended for bilateral hearing aids, has appt on 4/18 for dizziness, reports symptoms have been improved with therapy   She is looking forward to traveling to [place] with her daughter in May after her 89nd birthday   Medications: Outpatient Medications Prior to Visit  Medication Sig   amLODipine (NORVASC) 5 MG tablet TAKE 1 TABLET(5 MG) BY MOUTH DAILY   Cholecalciferol (VITAMIN D3) 10000 units TABS Take 10,000 Units by mouth at bedtime.   clobetasol ointment (TEMOVATE) 0.05 % Apply 1 application topically once a week. Applied to affected area of vagina and rectum   gabapentin (NEURONTIN) 800 MG tablet Take by mouth.   hydrALAZINE (APRESOLINE) 10 MG tablet Take 1 tablet (10 mg total) by mouth 2 (two) times daily.   latanoprost (XALATAN) 0.005 % ophthalmic solution Place 1 drop into both eyes at bedtime.    losartan (COZAAR) 50 MG tablet Take 1 tablet (50 mg total) by mouth daily.   Multiple Vitamin (MULTIVITAMIN) tablet Take 1 tablet by mouth daily.   multivitamin-lutein (OCUVITE-LUTEIN) CAPS capsule Take 1 capsule by mouth every morning.    polyethylene glycol powder (GLYCOLAX/MIRALAX) 17 GM/SCOOP powder Take 17 g by mouth at bedtime.    RESVERATROL-GRAPE PO Take 1 capsule by mouth daily. CoQ10 Resveratrol & Grape  Seed   cyanocobalamin (VITAMIN B12) 1000 MCG/ML injection Inject 42ml into muscle once a week for 4 weeks and then once a month for 4 months.   No facility-administered medications prior to visit.    Review of Systems     Objective    BP (!) 143/75 (BP Location: Left Arm, Patient Position: Sitting, Cuff Size: Normal)   Pulse 70   Wt 179 lb 9.6 oz (81.5 kg)   SpO2 96%   BMI 32.85 kg/m    Physical Exam Vitals reviewed.  Constitutional:      General: She is not in acute distress.    Appearance: Normal appearance. She is not ill-appearing, toxic-appearing or diaphoretic.  Eyes:     Conjunctiva/sclera: Conjunctivae normal.  Neck:     Thyroid: No thyroid mass, thyromegaly or thyroid tenderness.     Vascular: No carotid bruit.  Cardiovascular:     Rate and Rhythm: Normal rate and regular rhythm.     Pulses: Normal pulses.     Heart sounds: Normal heart sounds. No murmur heard.    No friction rub. No gallop.  Pulmonary:     Effort: Pulmonary effort is normal. No respiratory distress.     Breath sounds: Normal breath sounds. No stridor. No wheezing, rhonchi or rales.  Abdominal:     General: Bowel sounds are normal. There is no distension.     Palpations: Abdomen is soft.     Tenderness: There is no abdominal tenderness.  Musculoskeletal:     Right  lower leg: No edema.     Left lower leg: No edema.  Lymphadenopathy:     Cervical: No cervical adenopathy.  Neurological:     Mental Status: She is alert and oriented to person, place, and time.       No results found for any visits on 03/24/23.  Assessment & Plan     Problem List Items Addressed This Visit       Cardiovascular and Mediastinum   Benign essential HTN - Primary    Chronic  BP improved today  Home BP ranges with a 4 elevated measurements greater than 150/90 Patient reports doing well  Will continue current medication regimen including hydralazine 10mg  twice daily, amlodipine 5mg  and losartan 50mg           Other   Abnormality of gait due to impairment of balance    Reports that she has been using prior exercise charts provided from PT which is helping  She has a follow up appt with therapy in 2 weeks  Reports that symptoms are improved         Return in about 6 months (around 09/20/2023) for AWV w NHA.       Ronnald Ramp, MD  [facility] [phone] (phone) [phone] (fax)  [facility]

## 2023-03-24 NOTE — Assessment & Plan Note (Signed)
Reports that she has been using prior exercise charts provided from PT which is helping  She has a follow up appt with therapy in 2 weeks  Reports that symptoms are improved

## 2023-04-06 DIAGNOSIS — R42 Dizziness and giddiness: Secondary | ICD-10-CM | POA: Diagnosis not present

## 2023-04-11 ENCOUNTER — Telehealth: Payer: Self-pay | Admitting: Family Medicine

## 2023-04-11 ENCOUNTER — Other Ambulatory Visit: Payer: Self-pay

## 2023-04-11 MED ORDER — AMLODIPINE BESYLATE 5 MG PO TABS: ORAL_TABLET | ORAL | 3 refills | Status: DC

## 2023-04-11 NOTE — Telephone Encounter (Signed)
[facility] pharmacy requesting prescription refill amLODipine (NORVASC) 5 MG tablet   Please advise

## 2023-04-12 ENCOUNTER — Ambulatory Visit: Payer: Self-pay

## 2023-04-12 ENCOUNTER — Telehealth: Payer: Self-pay | Admitting: Family Medicine

## 2023-04-12 NOTE — Telephone Encounter (Signed)
Agree with 04/13/23 appointment for evaluation for now. We can follow up after patient has been seen by Alfredia Ferguson.   Ronnald Ramp, MD  [facility]

## 2023-04-12 NOTE — Telephone Encounter (Signed)
  Chief Complaint: dizziness Symptoms: dizziness but more of gait imbalance, orthostatic hypotension Frequency: ongoing for while Pertinent Negatives: NA Disposition: ED /[] Urgent Care (no appt availability in office) / Appointment(In office/virtual)/  [facility]/ Home Care/ Refused Recommended Disposition /[]  Mobile Bus/  Follow-up with PCP Additional Notes: pt states the imbalance has been ongoing since she seen Dr. Sullivan Lone but never could narrow down to what causing it. Went to ENT to r/o veritgo and pt states they told her she didn't have Veritgo but did have orthostatic hypotension when changing positions and to contact PCP. Pt also reports she picked up recent refill refill of amlodipine and noticed SE was dizziness so pt wanting to discuss with provider. No appts with Dr. Roxan Hockey until 04/24/23, pt was ok to see another provider so scheduled for tomorrow at 1300 with Lillia Abed, [place].   Summary: dizziness   Dizziness and staggering, possibly due to amlodipine she says.  Best contact: [phone]         Reason for Disposition  Taking a medicine that could cause dizziness (e.g., blood pressure medications, diuretics)  Answer Assessment - Initial Assessment Questions 2. LIGHTHEADED: "Do you feel lightheaded?" (e.g., somewhat faint, woozy, weak upon standing)     Woozy and staggering  3. VERTIGO: "Do you feel like either you or the room is spinning or tilting?" (i.e. vertigo)     no 4. SEVERITY: "How bad is it?"  "Do you feel like you are going to faint?" "Can you stand and walk?"   - MILD: Feels slightly dizzy, but walking normally.   - MODERATE: Feels unsteady when walking, but not falling; interferes with normal activities (e.g., school, work).   - SEVERE: Unable to walk without falling, or requires assistance to walk without falling; feels like passing out now.      Moderate  5. ONSET:  "When did the dizziness begin?"     Ongoing for while started  with Dr. Sullivan Lone  6. AGGRAVATING FACTORS: "Does anything make it worse?" (e.g., standing, change in head position)     Changing positions  8. CAUSE: "What do you think is causing the dizziness?"     Unsure if r/t amlodipine  10. OTHER SYMPTOMS: "Do you have any other symptoms?" (e.g., fever, chest pain, vomiting, diarrhea, bleeding)  Protocols used: Dizziness - Lightheadedness-A-AH

## 2023-04-12 NOTE — Telephone Encounter (Signed)
Advised 

## 2023-04-12 NOTE — Telephone Encounter (Signed)
Please Review. Patient is scheduled to see Alfredia Ferguson yesterday.

## 2023-04-13 ENCOUNTER — Ambulatory Visit (INDEPENDENT_AMBULATORY_CARE_PROVIDER_SITE_OTHER): Payer: PPO | Admitting: Physician Assistant

## 2023-04-13 ENCOUNTER — Encounter: Payer: Self-pay | Admitting: Physician Assistant

## 2023-04-13 VITALS — BP 147/77 | HR 77 | Wt 179.8 lb

## 2023-04-13 DIAGNOSIS — I10 Essential (primary) hypertension: Secondary | ICD-10-CM

## 2023-04-13 DIAGNOSIS — R42 Dizziness and giddiness: Secondary | ICD-10-CM | POA: Diagnosis not present

## 2023-04-13 NOTE — Progress Notes (Signed)
I,Kelly Clark,acting as a Neurosurgeon for [employer], PA-C.,have documented all relevant documentation on the behalf of Alfredia Ferguson, PA-C,as directed by  Alfredia Ferguson, PA-C while in the presence of Alfredia Ferguson, PA-C.   Established patient visit   Patient: Kelly Clark   DOB: January 16, 1929   89 y.o. Female  MRN: [id number] Visit Date: 04/13/2023  Today's healthcare provider: Alfredia Ferguson, PA-C   Cc. ENT saw orthostatic vitals, dizziness  Subjective    HPI   Pt attests to dizziness and imbalance, saw ENT who evaluated as orthostatic hypotension. Pt presents today for our opinion. Pt denies change in dizziness from any changes in her BP meds. Denies peripheral edema, palpitations.   Medications: Outpatient Medications Prior to Visit  Medication Sig   amLODipine (NORVASC) 5 MG tablet TAKE 1 TABLET(5 MG) BY MOUTH DAILY   Cholecalciferol (VITAMIN D3) 10000 units TABS Take 10,000 Units by mouth at bedtime.   clobetasol ointment (TEMOVATE) 0.05 % Apply 1 application topically once a week. Applied to affected area of vagina and rectum   cyanocobalamin (VITAMIN B12) 1000 MCG/ML injection Inject 1ml into muscle once a week for 4 weeks and then once a month for 4 months.   gabapentin (NEURONTIN) 800 MG tablet Take by mouth.   hydrALAZINE (APRESOLINE) 10 MG tablet Take 1 tablet (10 mg total) by mouth 2 (two) times daily.   latanoprost (XALATAN) 0.005 % ophthalmic solution Place 1 drop into both eyes at bedtime.    losartan (COZAAR) 50 MG tablet Take 1 tablet (50 mg total) by mouth daily.   Multiple Vitamin (MULTIVITAMIN) tablet Take 1 tablet by mouth daily.   multivitamin-lutein (OCUVITE-LUTEIN) CAPS capsule Take 1 capsule by mouth every morning.    polyethylene glycol powder (GLYCOLAX/MIRALAX) 17 GM/SCOOP powder Take 17 g by mouth at bedtime.    RESVERATROL-GRAPE PO Take 1 capsule by mouth daily. CoQ10 Resveratrol & Grape Seed   No facility-administered medications prior to  visit.    Review of Systems  Constitutional:  Negative for fatigue and fever.  Respiratory:  Negative for cough and shortness of breath.   Cardiovascular:  Negative for chest pain and leg swelling.  Gastrointestinal:  Negative for abdominal pain.  Neurological:  Positive for light-headedness. Negative for dizziness and headaches.      Objective    BP (!) 147/77 (BP Location: Left Arm, Patient Position: Sitting)   Pulse 77   Wt 179 lb 12.8 oz (81.6 kg)   SpO2 97%   BMI 32.89 kg/m   Physical Exam Constitutional:      General: She is awake.     Appearance: She is well-developed.  HENT:     Head: Normocephalic.  Eyes:     Conjunctiva/sclera: Conjunctivae normal.  Cardiovascular:     Rate and Rhythm: Normal rate and regular rhythm.     Heart sounds: Normal heart sounds.  Pulmonary:     Effort: Pulmonary effort is normal.     Breath sounds: Normal breath sounds.  Skin:    General: Skin is warm.  Neurological:     Mental Status: She is alert and oriented to person, place, and time.  Psychiatric:        Attention and Perception: Attention normal.        Mood and Affect: Mood normal.        Speech: Speech normal.        Behavior: Behavior is cooperative.      No results found for any visits on  04/13/23.  Assessment & Plan     1. Lightheadedness No real orthostatic pattern to vitals; some systolic change when sitting to standing but nothing classified as hypotension.  Pt denies any changes in symptoms with addition of hydralazine.   Given difficulty in controlling BP and continued lightheadedness, referring to cardiology. Advised in the meantime taking her time sitting - > standing, wearing compression stockings, keeping feet elevated.  Return if symptoms worsen or fail to improve.      I, Alfredia Ferguson, PA-C have reviewed all documentation for this visit. The documentation on  04/13/23   for the exam, diagnosis, procedures, and orders are all accurate and  complete. Alfredia Ferguson, PA-C [facility] [address] [place], [place], [address] Office: [phone] Fax: [phone]   [facility]

## 2023-05-28 ENCOUNTER — Other Ambulatory Visit: Payer: Self-pay | Admitting: Family Medicine

## 2023-05-29 NOTE — Telephone Encounter (Signed)
Requested Prescriptions  Pending Prescriptions Disp Refills   hydrALAZINE (APRESOLINE) 10 MG tablet [Pharmacy Med Name: HYDRALAZINE 10 MG TABLETS (ORANGE)] 180 tablet 0    Sig: TAKE 1 TABLET(10 MG) BY MOUTH TWICE DAILY     Cardiovascular:  Vasodilators Failed - 05/28/2023 10:22 AM      Failed - ANA Screen, Ifa, Serum in normal range and within 360 days    No results found for: "ANA", "ANATITER", "LABANTI"       Failed - Last BP in normal range    BP Readings from Last 1 Encounters:  04/13/23 (!) 147/77         Passed - HCT in normal range and within 360 days    HCT  Date Value Ref Range Status  08/16/2022 42 36 - 46 Final   Hematocrit  Date Value Ref Range Status  09/22/2021 39.4 34.0 - 46.6 % Final         Passed - HGB in normal range and within 360 days    Hemoglobin  Date Value Ref Range Status  08/16/2022 13.9 12.0 - 16.0 Final  09/22/2021 13.8 11.1 - 15.9 g/dL Final         Passed - RBC in normal range and within 360 days    RBC  Date Value Ref Range Status  08/16/2022 4.47 3.87 - 5.11 Final         Passed - WBC in normal range and within 360 days    WBC  Date Value Ref Range Status  08/16/2022 4.3  Final  07/09/2018 5.5 3.6 - 11.0 K/uL Final         Passed - PLT in normal range and within 360 days    Platelets  Date Value Ref Range Status  08/16/2022 157 150 - 400 K/uL Final  09/22/2021 187 150 - 450 x10E3/uL Final         Passed - Valid encounter within last 12 months    Recent Outpatient Visits           1 month ago Lightheadedness   [place] [facility] [place], [place], PA-C   2 months ago Benign essential HTN   [place] [facility] Simmons-Robinson, [place], MD   3 months ago Benign essential HTN   [place] [facility] [place], [place], MD   3 months ago Benign essential HTN   [place] [facility] [place], [place], MD   9 months ago Benign essential HTN    [place] [facility] Bosie Clos, MD       Future Appointments             In 1 week Simmons-Robinson, Tawanna Cooler, MD [facility]   In 3 weeks Ag[place]-Etang, Arlys John, MD [facility]

## 2023-06-06 ENCOUNTER — Encounter: Payer: Self-pay | Admitting: Family Medicine

## 2023-06-06 ENCOUNTER — Ambulatory Visit (INDEPENDENT_AMBULATORY_CARE_PROVIDER_SITE_OTHER): Payer: PPO | Admitting: Family Medicine

## 2023-06-06 VITALS — BP 143/77 | HR 80 | Wt 180.0 lb

## 2023-06-06 DIAGNOSIS — E538 Deficiency of other specified B group vitamins: Secondary | ICD-10-CM | POA: Diagnosis not present

## 2023-06-06 DIAGNOSIS — G64 Other disorders of peripheral nervous system: Secondary | ICD-10-CM

## 2023-06-06 DIAGNOSIS — I10 Essential (primary) hypertension: Secondary | ICD-10-CM | POA: Diagnosis not present

## 2023-06-06 DIAGNOSIS — H40153 Residual stage of open-angle glaucoma, bilateral: Secondary | ICD-10-CM | POA: Diagnosis not present

## 2023-06-06 DIAGNOSIS — R5383 Other fatigue: Secondary | ICD-10-CM

## 2023-06-06 MED ORDER — GABAPENTIN 600 MG PO TABS: 600.0000 mg | ORAL_TABLET | Freq: Every day | ORAL | 3 refills | Status: DC

## 2023-06-06 NOTE — Assessment & Plan Note (Signed)
Chronic  Stable  Continue amlodipine 5, losartan 50mg  and hydralazine 10mg  twice daily  Hesitant to adjust BP medications given advanced age with hx of dizziness

## 2023-06-06 NOTE — Assessment & Plan Note (Signed)
Chronic neuropathy symptoms  Followed by neurology in the past  Will decrease gabapentin to 600mg  daily per patient request and tolerance

## 2023-06-06 NOTE — Progress Notes (Signed)
I,Sha'taria Tyson,acting as a Neurosurgeon for [employer], MD.,have documented all relevant documentation on the behalf of Ronnald Ramp, MD,as directed by  Ronnald Ramp, MD while in the presence of Ronnald Ramp, MD.   Established patient visit   Patient: Kelly Clark   DOB: 03/09/1929   89 y.o. Female  MRN: [id number] Visit Date: 06/06/2023  Today's healthcare provider: Ronnald Ramp, MD   No chief complaint on file.  Subjective    HPI   Medication management   Dizziness  Patient presents with questions regarding medications  Patient was seen in May with symptoms of lightheadedness for which she was referred to cardiology, she is scheduled for 06/21/23  Reports that dizziness is improved since she had chiropractor adjustment while in [place]  Denies symptoms today   Neuropathy  -Patient would like rx for gabapentin 600 mg instead of 800 mg. States that she tolerates the 600mg  dose better and has been taking the old prescription that was automatically refilled recently. She reports continued burning sensation in both feet and was told that there were not many options for her neuropathy by a physician in [place].   HTN  Reports that her BP was higher here in the clinic but at home was improved  Denies HA or chest discomfort  Reports regular adherence to BP medications  Also notes that went to chiropractor while in [place] and noticed her BP dropped down to 140 range after all visit.   Vitamin B12 Deficiency  Patient reports previously receiving injections to replenish B12 stores  States that she has had little energy since being back in [place] for the past two weeks  States that she often wants to sleep  She would like to have lab work to check her levels to see if B12 may be affecting her energy    Medications: Outpatient Medications Prior to Visit  Medication Sig   amLODipine (NORVASC) 5 MG tablet TAKE 1 TABLET(5 MG)  BY MOUTH DAILY   Cholecalciferol (VITAMIN D3) 10000 units TABS Take 10,000 Units by mouth at bedtime.   clobetasol ointment (TEMOVATE) 0.05 % Apply 1 application topically once a week. Applied to affected area of vagina and rectum   hydrALAZINE (APRESOLINE) 10 MG tablet TAKE 1 TABLET(10 MG) BY MOUTH TWICE DAILY   latanoprost (XALATAN) 0.005 % ophthalmic solution Place 1 drop into both eyes at bedtime.    losartan (COZAAR) 50 MG tablet Take 1 tablet (50 mg total) by mouth daily.   Multiple Vitamin (MULTIVITAMIN) tablet Take 1 tablet by mouth daily.   multivitamin-lutein (OCUVITE-LUTEIN) CAPS capsule Take 1 capsule by mouth every morning.    polyethylene glycol powder (GLYCOLAX/MIRALAX) 17 GM/SCOOP powder Take 17 g by mouth at bedtime.    RESVERATROL-GRAPE PO Take 1 capsule by mouth daily. CoQ10 Resveratrol & Grape Seed   Turmeric (QC TUMERIC COMPLEX) 500 MG CAPS Take by mouth.   [DISCONTINUED] gabapentin (NEURONTIN) 800 MG tablet Take by mouth.   cyanocobalamin (VITAMIN B12) 1000 MCG/ML injection Inject 1ml into muscle once a week for 4 weeks and then once a month for 4 months.   No facility-administered medications prior to visit.    Review of Systems     Objective    BP (!) 143/77 Comment: 10:45 am  Pulse 80   Wt 180 lb (81.6 kg)   BMI 32.92 kg/m    Physical Exam Vitals reviewed.  Constitutional:      General: She is not in acute distress.  Appearance: Normal appearance. She is not ill-appearing, toxic-appearing or diaphoretic.  Eyes:     Conjunctiva/sclera: Conjunctivae normal.  Cardiovascular:     Rate and Rhythm: Normal rate and regular rhythm.     Pulses: Normal pulses.     Heart sounds: Normal heart sounds. No murmur heard.    No friction rub. No gallop.  Pulmonary:     Effort: Pulmonary effort is normal. No respiratory distress.     Breath sounds: Normal breath sounds. No stridor. No wheezing, rhonchi or rales.  Abdominal:     General: Bowel sounds are normal.  There is no distension.     Palpations: Abdomen is soft.     Tenderness: There is no abdominal tenderness.  Musculoskeletal:     Right lower leg: No edema.     Left lower leg: No edema.  Skin:    Findings: No erythema or rash.  Neurological:     Mental Status: She is alert and oriented to person, place, and time.     Cranial Nerves: Cranial nerves 2-12 are intact. No cranial nerve deficit, dysarthria or facial asymmetry.     Motor: Motor function is intact. No tremor.     Gait: Gait is intact.       No results found for any visits on 06/06/23.  Assessment & Plan     Problem List Items Addressed This Visit       Cardiovascular and Mediastinum   Benign essential HTN - Primary    Chronic  Stable  Continue amlodipine 5, losartan 50mg  and hydralazine 10mg  twice daily  Hesitant to adjust BP medications given advanced age with hx of dizziness         Nervous and Auditory   Disorder of peripheral nervous system    Chronic neuropathy symptoms  Followed by neurology in the past  Will decrease gabapentin to 600mg  daily per patient request and tolerance       Relevant Medications   gabapentin (NEURONTIN) 600 MG tablet     Other   Vitamin B12 deficiency    Chronic  Hx of vitamin B 12 deficiency  Will check B12 levels today  Patient has been on injections of b12 and oral supplementation in the past  Now with symptoms of fatigue      Relevant Orders   Vitamin B12   TSH + free T4   CBC   Other fatigue    New acute  Denies recent illness associated with symptoms  Discussed potential for adjustment to time changes after being in [place] for 2 weeks  Also recommended checking TSH, free T4, CBC for anemia and B12 levels due to hx of needing b12 supplementation        Relevant Orders   TSH + free T4   CBC     Return in about 3 months (around 09/06/2023) for chronic .        The entirety of the information documented in the History of Present Illness, Review of Systems  and Physical Exam were personally obtained by me. Portions of this information were initially documented by Sha'taria Tyson,CMA. I, Ronnald Ramp, MD have reviewed the documentation above for thoroughness and accuracy.      Ronnald Ramp, MD  [facility] [phone] (phone) [phone] (fax)  [facility]

## 2023-06-06 NOTE — Assessment & Plan Note (Signed)
Chronic  Hx of vitamin B 12 deficiency  Will check B12 levels today  Patient has been on injections of b12 and oral supplementation in the past  Now with symptoms of fatigue

## 2023-06-06 NOTE — Assessment & Plan Note (Signed)
New acute  Denies recent illness associated with symptoms  Discussed potential for adjustment to time changes after being in [place] for 2 weeks  Also recommended checking TSH, free T4, CBC for anemia and B12 levels due to hx of needing b12 supplementation

## 2023-06-07 LAB — CBC
Hematocrit: 39.5 % (ref 34.0–46.6)
Hemoglobin: 13.5 g/dL (ref 11.1–15.9)
MCH: 31.3 pg (ref 26.6–33.0)
MCHC: 34.2 g/dL (ref 31.5–35.7)
MCV: 91 fL (ref 79–97)
Platelets: 176 10*3/uL (ref 150–450)
RBC: 4.32 x10E6/uL (ref 3.77–5.28)
RDW: 13.1 % (ref 11.7–15.4)
WBC: 5.6 10*3/uL (ref 3.4–10.8)

## 2023-06-07 LAB — TSH+FREE T4
Free T4: 1.02 ng/dL (ref 0.82–1.77)
TSH: 1.4 u[IU]/mL (ref 0.450–4.500)

## 2023-06-07 LAB — VITAMIN B12: Vitamin B-12: 804 pg/mL (ref 232–1245)

## 2023-06-16 ENCOUNTER — Telehealth: Payer: Self-pay

## 2023-06-16 NOTE — Telephone Encounter (Signed)
Message left to call to provide cardiac history.

## 2023-06-21 ENCOUNTER — Encounter: Payer: Self-pay | Admitting: Cardiology

## 2023-06-21 ENCOUNTER — Ambulatory Visit: Payer: PPO | Attending: Cardiology | Admitting: Cardiology

## 2023-06-21 ENCOUNTER — Ambulatory Visit (INDEPENDENT_AMBULATORY_CARE_PROVIDER_SITE_OTHER): Payer: PPO

## 2023-06-21 VITALS — BP 172/76 | HR 67 | Ht 62.0 in | Wt 179.8 lb

## 2023-06-21 DIAGNOSIS — R5383 Other fatigue: Secondary | ICD-10-CM | POA: Diagnosis not present

## 2023-06-21 DIAGNOSIS — R9431 Abnormal electrocardiogram [ECG] [EKG]: Secondary | ICD-10-CM

## 2023-06-21 DIAGNOSIS — I10 Essential (primary) hypertension: Secondary | ICD-10-CM

## 2023-06-21 DIAGNOSIS — R42 Dizziness and giddiness: Secondary | ICD-10-CM

## 2023-06-21 NOTE — Patient Instructions (Addendum)
Medication Instructions:  Your physician recommends that you continue on your current medications as directed. Please refer to the Current Medication list given to you today.  *If you need a refill on your cardiac medications before your next appointment, please call your pharmacy*  Lab Work: -None ordered  Testing/Procedures: Your physician has requested that you have an echocardiogram. Echocardiography is a painless test that uses sound waves to create images of your heart. It provides your doctor with information about the size and shape of your heart and how well your heart's chambers and valves are working. This procedure takes approximately one hour. There are no restrictions for this procedure. Please do NOT wear cologne, perfume, aftershave, or lotions (deodorant is allowed). Please arrive 15 minutes prior to your appointment time.   Heart Monitor:  Your physician has requested you wear a ZIO XT monitor for 14 days.  Your monitor will be mailed to your home address within 3-5 business days. This is sent via [employer] from [employer]. However, if you have not received your monitor after 5 business days please send [place] a MyChart message or call the office at [phone], so we may follow up on this for you.   This monitor is a medical device (single patch monitor) that records the heart's electrical activity. Doctors most often use these monitors to diagnose arrhythmias. Arrhythmias are problems with the speed or rhythm of the heartbeat.   [employer] supplies 1 patch per enrollment. Additional stickers are not available.  Please DO NOT apply the patch if you will be having a Nuclear Stress Test, Echocardiogram, Cardiac CT, Cardiac MRI, Chest X-ray during the period you would be wearing the monitor. The patch cannot be worn during these tests.  You cannot remove and re-apply the ZIO patch monitor.   Applying the Monitor: Once you receive your monitor, this will include a small  razor, abrader, and 4 alcohol pads. Shave hair from upper left chest Rub abrader disc in 40 strokes over the left upper chest as indicated in your monitor instructions Clean area with 4 enclosed alcohol pads (there may be a mild & brief stinging sensation over the newly abraded area, but this is normal). Let dry Apply patch as indicated in monitor instructions. Patch will be placed under collarbone on the left side of the chest with arrow pointing upward. Rub adhesive wings for 2 minutes. Remove white label marked "1". Remove the white label marked "2". Rub patch adhesive wings for an 2 minutes.  While looking in a mirror, press and release button in the center of the patch. You may hear a "click". A small green light will flash 4-6 times and then stop. This will be your indicator that the monitor has been turned on.  Wearing the Monitor: Avoid showering during the first 24 hours of wearing the monitor.  After 24 hours you may shower with the patch on. Take brief showers with your back facing the shower head.  Avoid excessive sweating to help maximize wear time. Do not submerge the device, no hot tubs, and no swimming pools. Keep any lotions or oils away from the patch. Press the button if you feel a symptom. You will hear a small click. Record date, time, and symptoms in the Patient Logbook or App.  Monitor Issues: Call [employer] Customer Care at [phone] if you have questions regarding your Zio Patch Monitor. Call them immediately if you see an orange/ amber colored light blinking on your monitor. If your  monitor falls off and you cannot get this reapplied or if you need suggestions for securing your monitor call [employer] at [phone].   Returning the Monitor: Once you have completed wearing your monitor, follow instructions on the last 2 pages of the Patient Logbook. Stick monitor patch on to the last page of the Patient Logbook.  Place Patient Logbook with monitor in  the return box provided. Use locking tab on box and tape box closed securely. The return box has pre-paid postage on it.  Place the return box in the regular [place] Mail box as soon as possible It will take anywhere from 1-2 weeks for your provider to receive and review your results once you mail this back. If for some reason you have misplaced your return box then call our office and we can provide another box and/or mail it off for you.   Billing  and Patient Assistance Program Information: We have supplied [employer] with any of your insurance information on file for billing purposes. [employer] offers a sliding scale Patient Assistance Program for patients that do not have insurance, or whose insurance does not completely cover the cost of the ZIO monitor. You must apply for the Patient Assistance Program to qualify for this discounted rate. To apply, please call [employer] at [phone], select option 1, ask to apply for the Patient Assistance Program. [employer] will ask your household income, and how many people are in your household. They will quote your out-of-pocket cost based on that information. [employer] will also be able to set up for a 61-month, interest-free payment plan if needed.     Follow-Up: At [facility], you and your health needs are our priority.  As part of our continuing mission to provide you with exceptional heart care, we have created designated Provider Care Teams.  These Care Teams include your primary Cardiologist (physician) and Advanced Practice Providers (APPs -  Physician Assistants and Nurse Practitioners) who all work together to provide you with the care you need, when you need it.  Your next appointment:   2 month(s)  Provider:   You may see Debbe Odea, MD or one of the following Advanced Practice Providers on your designated Care Team:   Nicolasa Ducking, NP Eula Listen, PA-C Cadence Fransico Michael, PA-C Charlsie Quest, NP    Other  Instructions [email or web address] [facility] Patch Heart Monitor 3:46)

## 2023-06-21 NOTE — Progress Notes (Signed)
Cardiology Office Note:    Date:  06/21/2023   ID:  Kelly Clark, DOB 08-17-1929, MRN [id number]  PCP:  Ronnald Ramp, MD   [facility] Providers Cardiologist:  Debbe Odea, MD     Referring MD: [name] Albino*   Chief Complaint  Patient presents with   New Patient (Initial Visit)    Referred for cardiac evaluation due to decreasing energy with balance concerns.  Will feel imbalance with no know triggers.  Orthostatic blood pressure obtained today.    History of Present Illness:    Kelly Clark is a 89 y.o. female with a hx of hypertension, peripheral neuropathy, gait instability, presenting with lightheadedness and fatigue.  Patient has noticed fatigue and staggering gait over the past month or so.  Denies any mental fog, denies palpitations or history of heart disease.  Also sees ENT due to dizziness, was told crystals were present in her ear which were removed.  Dizziness improved initially, but later returned.  Recently diagnosed with vitamin B12 deficiency, given an injection.  Was told by ENT needed to see cardiology.  Her blood pressures usually run in the 140s to 150s.  Past Medical History:  Diagnosis Date   Basal cell carcinoma 2019   RIGHT THUMB REGION   Breast cancer (HCC) 06/27/2018   RIGHT   Cancer (HCC) 1977   melanoma . SURGERY RIGHT ARM   Hemorrhoids    Hypertension    Hypertriglyceridemia    Insomnia    Menopausal disorder    Neutropenia (HCC)    Paresthesia    Peripheral neuropathy    Tinea corporis    Vitamin D deficiency     Past Surgical History:  Procedure Laterality Date   ABDOMINAL HYSTERECTOMY  1977   BREAST BIOPSY Bilateral 1977   benign   BREAST BIOPSY Right 06/27/2018   INVASIVE MAMMARY CARCINOMA, NO SPECIAL TYPE. 2 oclock ER/PR negative HER2 positive   BREAST EXCISIONAL BIOPSY Right 07/23/2018   lumpectomy   BREAST LUMPECTOMY Right 2019   [facility]   BREAST LUMPECTOMY WITH SENTINEL LYMPH NODE BIOPSY  Right 07/23/2018   6 mm ER/PR negative, Her 2 neu 3+; node negative.  Surgeon: Earline Mayotte, MD;  Location: [facility] ORS;  Service: General;  Laterality: Right;   COLONOSCOPY  2007   ELECTROCARDIOGRAM     EYE SURGERY Bilateral    CATARACT EXTRACTIONS    Current Medications: Current Meds  Medication Sig   amLODipine (NORVASC) 5 MG tablet TAKE 1 TABLET(5 MG) BY MOUTH DAILY   Cholecalciferol (VITAMIN D3) 10000 units TABS Take 10,000 Units by mouth at bedtime.   clobetasol ointment (TEMOVATE) 0.05 % Apply 1 application topically once a week. Applied to affected area of vagina and rectum   gabapentin (NEURONTIN) 600 MG tablet Take 1 tablet (600 mg total) by mouth daily.   hydrALAZINE (APRESOLINE) 10 MG tablet TAKE 1 TABLET(10 MG) BY MOUTH TWICE DAILY   latanoprost (XALATAN) 0.005 % ophthalmic solution Place 1 drop into both eyes at bedtime.    losartan (COZAAR) 50 MG tablet Take 1 tablet (50 mg total) by mouth daily.   Multiple Vitamin (MULTIVITAMIN) tablet Take 1 tablet by mouth daily.   multivitamin-lutein (OCUVITE-LUTEIN) CAPS capsule Take 1 capsule by mouth every morning.    polyethylene glycol powder (GLYCOLAX/MIRALAX) 17 GM/SCOOP powder Take 17 g by mouth at bedtime.    RESVERATROL-GRAPE PO Take 1 capsule by mouth daily. CoQ10 Resveratrol & Grape Seed   Turmeric (QC TUMERIC COMPLEX) 500 MG  CAPS Take by mouth.     Allergies:   Patient has no known allergies.   Social History   Socioeconomic History   Marital status: Widowed    Spouse name: Not on file   Number of children: 1   Years of education: H/S   Highest education level: Some college, no degree  Occupational History   Occupation: Retired  Tobacco Use   Smoking status: Never   Smokeless tobacco: Never  Vaping Use   Vaping Use: Never used  Substance and Sexual Activity   Alcohol use: No   Drug use: No   Sexual activity: Not Currently  Other Topics Concern   Not on file  Social History Narrative   Not on file    Social Determinants of Health   Financial Resource Strain: Low Risk  (03/20/2023)   Overall Financial Resource Strain (CARDIA)    Difficulty of Paying Living Expenses: Not hard at all  Food Insecurity: No Food Insecurity (03/20/2023)   Hunger Vital Sign    Worried About Running Out of Food in the Last Year: Never true    Ran Out of Food in the Last Year: Never true  Transportation Needs: No Transportation Needs (03/20/2023)   PRAPARE - Administrator, Civil Service (Medical): No    Lack of Transportation (Non-Medical): No  Physical Activity: Inactive (03/20/2023)   Exercise Vital Sign    Days of Exercise per Week: 0 days    Minutes of Exercise per Session: 60 min  Stress: No Stress Concern Present (03/20/2023)   [employer] of Occupational Health - Occupational Stress Questionnaire    Feeling of Stress : Not at all  Social Connections: Moderately Integrated (03/20/2023)   Social Connection and Isolation Panel [NHANES]    Frequency of Communication with Friends and Family: More than three times a week    Frequency of Social Gatherings with Friends and Family: Patient declined    Attends Religious Services: More than 4 times per year    Active Member of [employer] or Organizations: Yes    Attends Banker Meetings: Never    Marital Status: Widowed     Family History: The patient's family history includes Colon cancer (age of onset: 89) in her sister; Congestive Heart Failure in her mother; Healthy in her daughter; Heart attack in her mother; Stroke in her father; Throat cancer (age of onset: 45) in her maternal grandmother. There is no history of Breast cancer.  ROS:   Please see the history of present illness.     All other systems reviewed and are negative.  EKGs/Labs/Other Studies Reviewed:    The following studies were reviewed today:  EKG Interpretation Date/Time:  Wednesday June 21 2023 11:57:44 EDT Ventricular Rate:  67 PR Interval:  160 QRS  Duration:  74 QT Interval:  370 QTC Calculation: 390 R Axis:   -3  Text Interpretation: Normal sinus rhythm Septal infarct , age undetermined Confirmed by Debbe Odea ([id number]) on 06/21/2023 12:09:48 PM    Recent Labs: 08/16/2022: ALT 15 02/28/2023: BUN 17; Creatinine, Ser 0.99; Magnesium 2.6; Potassium 4.0; Sodium 143 06/06/2023: Hemoglobin 13.5; Platelets 176; TSH 1.400  Recent Lipid Panel    Component Value Date/Time   CHOL 149 09/22/2021 0955   TRIG 146 09/22/2021 0955   HDL 47 09/22/2021 0955   CHOLHDL 3.2 09/22/2021 0955   LDLCALC 77 09/22/2021 0955     Risk Assessment/Calculations:          Physical Exam:  VS:  BP (!) 172/76 (BP Location: Left Arm, Patient Position: Sitting, Cuff Size: Large)   Pulse 67   Ht 5\' 2"  (1.575 m)   Wt 179 lb 12.8 oz (81.6 kg)   SpO2 98%   BMI 32.89 kg/m     Wt Readings from Last 3 Encounters:  06/21/23 179 lb 12.8 oz (81.6 kg)  06/06/23 180 lb (81.6 kg)  04/13/23 179 lb 12.8 oz (81.6 kg)     GEN:  Well nourished, well developed in no acute distress HEENT: Normal NECK: No JVD; No carotid bruits CARDIAC: RRR, no murmurs, rubs, gallops RESPIRATORY:  Clear to auscultation without rales, wheezing or rhonchi  ABDOMEN: Soft, non-tender, non-distended MUSCULOSKELETAL:  No edema; No deformity  SKIN: Warm and dry NEUROLOGIC:  Alert and oriented x 3 PSYCHIATRIC:  Normal affect   ASSESSMENT:    1. Other fatigue   2. Dizziness   3. Abnormal EKG   4. Primary hypertension    PLAN:    In order of problems listed above:  Fatigue, EKG abnormality, obtain echo to rule out any structural abnormalities.  Will place cardiac monitor to rule out any significant arrhythmias in light of staggering gait and episodic dizziness.  Etiology for fatigue appears noncardiac. Dizziness, orthostatic vitals today with no evidence for orthostasis, she did feel dizzy changing positions suggesting positional vertigo.  Follow-up with ENT and PCP regarding  management. Hypertension, BP elevated, usually more reasonable in the 140s to 150s which I think is appropriate for age given constellation symptoms of dizziness and risk of falls.  Continue current BP meds.  Follow-up after cardiac testing.      Medication Adjustments/Labs and Tests Ordered: Current medicines are reviewed at length with the patient today.  Concerns regarding medicines are outlined above.  Orders Placed This Encounter  Procedures   LONG TERM MONITOR (3-14 DAYS)   EKG 12-Lead   ECHOCARDIOGRAM COMPLETE   No orders of the defined types were placed in this encounter.   Patient Instructions  Medication Instructions:  Your physician recommends that you continue on your current medications as directed. Please refer to the Current Medication list given to you today.  *If you need a refill on your cardiac medications before your next appointment, please call your pharmacy*  Lab Work: -None ordered  Testing/Procedures: Your physician has requested that you have an echocardiogram. Echocardiography is a painless test that uses sound waves to create images of your heart. It provides your doctor with information about the size and shape of your heart and how well your heart's chambers and valves are working. This procedure takes approximately one hour. There are no restrictions for this procedure. Please do NOT wear cologne, perfume, aftershave, or lotions (deodorant is allowed). Please arrive 15 minutes prior to your appointment time.   Heart Monitor:  Your physician has requested you wear a ZIO XT monitor for 14 days.  Your monitor will be mailed to your home address within 3-5 business days. This is sent via [employer] from [employer]. However, if you have not received your monitor after 5 business days please send [place] a MyChart message or call the office at [phone], so we may follow up on this for you.   This monitor is a medical device (single patch monitor)  that records the heart's electrical activity. Doctors most often use these monitors to diagnose arrhythmias. Arrhythmias are problems with the speed or rhythm of the heartbeat.   [employer] supplies 1 patch per enrollment. Additional  stickers are not available.  Please DO NOT apply the patch if you will be having a Nuclear Stress Test, Echocardiogram, Cardiac CT, Cardiac MRI, Chest X-ray during the period you would be wearing the monitor. The patch cannot be worn during these tests.  You cannot remove and re-apply the ZIO patch monitor.   Applying the Monitor: Once you receive your monitor, this will include a small razor, abrader, and 4 alcohol pads. Shave hair from upper left chest Rub abrader disc in 40 strokes over the left upper chest as indicated in your monitor instructions Clean area with 4 enclosed alcohol pads (there may be a mild & brief stinging sensation over the newly abraded area, but this is normal). Let dry Apply patch as indicated in monitor instructions. Patch will be placed under collarbone on the left side of the chest with arrow pointing upward. Rub adhesive wings for 2 minutes. Remove white label marked "1". Remove the white label marked "2". Rub patch adhesive wings for an 2 minutes.  While looking in a mirror, press and release button in the center of the patch. You may hear a "click". A small green light will flash 4-6 times and then stop. This will be your indicator that the monitor has been turned on.  Wearing the Monitor: Avoid showering during the first 24 hours of wearing the monitor.  After 24 hours you may shower with the patch on. Take brief showers with your back facing the shower head.  Avoid excessive sweating to help maximize wear time. Do not submerge the device, no hot tubs, and no swimming pools. Keep any lotions or oils away from the patch. Press the button if you feel a symptom. You will hear a small click. Record date, time, and symptoms in the Patient  Logbook or App.  Monitor Issues: Call [employer] Customer Care at [phone] if you have questions regarding your Zio Patch Monitor. Call them immediately if you see an orange/ amber colored light blinking on your monitor. If your monitor falls off and you cannot get this reapplied or if you need suggestions for securing your monitor call [employer] at [phone].   Returning the Monitor: Once you have completed wearing your monitor, follow instructions on the last 2 pages of the Patient Logbook. Stick monitor patch on to the last page of the Patient Logbook.  Place Patient Logbook with monitor in the return box provided. Use locking tab on box and tape box closed securely. The return box has pre-paid postage on it.  Place the return box in the regular [place] Mail box as soon as possible It will take anywhere from 1-2 weeks for your provider to receive and review your results once you mail this back. If for some reason you have misplaced your return box then call our office and we can provide another box and/or mail it off for you.   Billing  and Patient Assistance Program Information: We have supplied [employer] with any of your insurance information on file for billing purposes. [employer] offers a sliding scale Patient Assistance Program for patients that do not have insurance, or whose insurance does not completely cover the cost of the ZIO monitor. You must apply for the Patient Assistance Program to qualify for this discounted rate. To apply, please call [employer] at [phone], select option 1, ask to apply for the Patient Assistance Program. [employer] will ask your household income, and how many people are in your household. They will quote your out-of-pocket cost based on  that information. [employer] will also be able to set up for a 14-month, interest-free payment plan if needed.     Follow-Up: At [facility], you and your health needs are our priority.  As part of our  continuing mission to provide you with exceptional heart care, we have created designated Provider Care Teams.  These Care Teams include your primary Cardiologist (physician) and Advanced Practice Providers (APPs -  Physician Assistants and Nurse Practitioners) who all work together to provide you with the care you need, when you need it.  Your next appointment:   2 month(s)  Provider:   You may see Debbe Odea, MD or one of the following Advanced Practice Providers on your designated Care Team:   Nicolasa Ducking, NP Eula Listen, PA-C Cadence Fransico Michael, PA-C Charlsie Quest, NP    Other Instructions [email or web address] [facility] Patch Heart Monitor 3:46)   Signed, Debbe Odea, MD  06/21/2023 12:48 PM    [facility]

## 2023-06-25 DIAGNOSIS — R42 Dizziness and giddiness: Secondary | ICD-10-CM | POA: Diagnosis not present

## 2023-06-25 DIAGNOSIS — R5383 Other fatigue: Secondary | ICD-10-CM

## 2023-06-25 DIAGNOSIS — R9431 Abnormal electrocardiogram [ECG] [EKG]: Secondary | ICD-10-CM

## 2023-06-27 ENCOUNTER — Ambulatory Visit: Payer: PPO | Admitting: Podiatry

## 2023-06-27 DIAGNOSIS — G609 Hereditary and idiopathic neuropathy, unspecified: Secondary | ICD-10-CM | POA: Diagnosis not present

## 2023-06-27 NOTE — Progress Notes (Unsigned)
   Chief Complaint  Patient presents with   Peripheral Neuropathy    Patient came in today for neuropathy, started 20 years ago, burning,hot, forefoot feels swollen, TX: gabapentin, B12, soaking     HPI: 89 y.o. female presenting today as a new patient for evaluation of burning and tingling sensation to the bilateral feet ongoing for about 20 years.  She has seen multiple physicians and neurologist to help diagnose and alleviate some of her symptoms of the peripheral polyneuropathy that she has been diagnosed with.  Presenting for another opinion to see if there is any additional treatment options  Past Medical History:  Diagnosis Date   Basal cell carcinoma 2019   RIGHT THUMB REGION   Breast cancer (HCC) 06/27/2018   RIGHT   Cancer (HCC) 1977   melanoma . SURGERY RIGHT ARM   Hemorrhoids    Hypertension    Hypertriglyceridemia    Insomnia    Menopausal disorder    Neutropenia (HCC)    Paresthesia    Peripheral neuropathy    Tinea corporis    Vitamin D deficiency     Past Surgical History:  Procedure Laterality Date   ABDOMINAL HYSTERECTOMY  1977   BREAST BIOPSY Bilateral 1977   benign   BREAST BIOPSY Right 06/27/2018   INVASIVE MAMMARY CARCINOMA, NO SPECIAL TYPE. 2 oclock ER/PR negative HER2 positive   BREAST EXCISIONAL BIOPSY Right 07/23/2018   lumpectomy   BREAST LUMPECTOMY Right 2019   [facility]   BREAST LUMPECTOMY WITH SENTINEL LYMPH NODE BIOPSY Right 07/23/2018   6 mm ER/PR negative, Her 2 neu 3+; node negative.  Surgeon: Earline Mayotte, MD;  Location: [facility] ORS;  Service: General;  Laterality: Right;   COLONOSCOPY  2007   ELECTROCARDIOGRAM     EYE SURGERY Bilateral    CATARACT EXTRACTIONS    No Known Allergies   Physical Exam: General: The patient is alert and oriented x3 in no acute distress.  Dermatology: Skin is warm, dry and supple bilateral lower extremities.   Vascular: Palpable pedal pulses bilaterally. Capillary refill within normal limits.  No  appreciable edema.  No erythema.  Neurological: Grossly intact via light touch  Musculoskeletal Exam: No pedal deformities noted   Assessment/Plan of Care: 1.  Chronic peripheral polyneuropathy bilateral feet x 20 years  -Patient has tried multiple modalities including oral supplementation, oral medication such as gabapentin, different shoe gear modifications, and she continues to have the peripheral polyneuropathy -I explained to the patient that I do believe this is permanent.  I do not believe there is anything that could permanently cure her of the peripheral neuropathy especially since she has had this for about 20 years. -Continue conservative modalities including oral gabapentin, vitamin B supplementation, and shoe gear modifications. -Unfortunately there is nothing additional I could offer for the patient -Return to clinic as needed     Felecia Shelling, DPM [facility]  Dr. Felecia Shelling, DPM    [address] [place], [place] [address]                Office [phone]  Fax [phone]

## 2023-07-13 ENCOUNTER — Other Ambulatory Visit: Payer: Self-pay | Admitting: Cardiology

## 2023-07-13 ENCOUNTER — Ambulatory Visit: Payer: PPO | Attending: Cardiology

## 2023-07-13 DIAGNOSIS — R9431 Abnormal electrocardiogram [ECG] [EKG]: Secondary | ICD-10-CM | POA: Diagnosis not present

## 2023-07-13 DIAGNOSIS — R42 Dizziness and giddiness: Secondary | ICD-10-CM

## 2023-07-13 DIAGNOSIS — R5383 Other fatigue: Secondary | ICD-10-CM | POA: Diagnosis not present

## 2023-07-13 DIAGNOSIS — I10 Essential (primary) hypertension: Secondary | ICD-10-CM

## 2023-07-13 LAB — ECHOCARDIOGRAM COMPLETE
Area-P 1/2: 3.21 cm2
S' Lateral: 1.4 cm

## 2023-07-20 ENCOUNTER — Other Ambulatory Visit: Payer: Self-pay

## 2023-07-20 ENCOUNTER — Telehealth: Payer: Self-pay | Admitting: Family Medicine

## 2023-07-20 MED ORDER — DILTIAZEM HCL ER COATED BEADS 120 MG PO CP24: 120.0000 mg | ORAL_CAPSULE | Freq: Every day | ORAL | 3 refills | Status: DC

## 2023-07-20 NOTE — Telephone Encounter (Signed)
Reviewed medication update.

## 2023-07-20 NOTE — Telephone Encounter (Signed)
Pt is calling in because per pt her heart doctor is prescribing her another medication diltiazem (CARDIZEM CD) 120 MG 24 hr capsule [[id number]] and she no longer needs to take Amlodipine.

## 2023-08-10 DIAGNOSIS — R32 Unspecified urinary incontinence: Secondary | ICD-10-CM | POA: Diagnosis not present

## 2023-08-10 DIAGNOSIS — I10 Essential (primary) hypertension: Secondary | ICD-10-CM | POA: Diagnosis not present

## 2023-08-10 DIAGNOSIS — H40153 Residual stage of open-angle glaucoma, bilateral: Secondary | ICD-10-CM | POA: Diagnosis not present

## 2023-08-10 DIAGNOSIS — E559 Vitamin D deficiency, unspecified: Secondary | ICD-10-CM | POA: Diagnosis not present

## 2023-08-10 DIAGNOSIS — E669 Obesity, unspecified: Secondary | ICD-10-CM | POA: Diagnosis not present

## 2023-08-10 DIAGNOSIS — G8929 Other chronic pain: Secondary | ICD-10-CM | POA: Diagnosis not present

## 2023-08-10 DIAGNOSIS — N952 Postmenopausal atrophic vaginitis: Secondary | ICD-10-CM | POA: Diagnosis not present

## 2023-08-25 ENCOUNTER — Encounter: Payer: Self-pay | Admitting: Cardiology

## 2023-08-25 ENCOUNTER — Ambulatory Visit: Payer: PPO | Attending: Cardiology | Admitting: Cardiology

## 2023-08-25 VITALS — BP 156/80 | HR 68 | Ht 62.0 in | Wt 182.0 lb

## 2023-08-25 DIAGNOSIS — R5383 Other fatigue: Secondary | ICD-10-CM

## 2023-08-25 DIAGNOSIS — I10 Essential (primary) hypertension: Secondary | ICD-10-CM | POA: Diagnosis not present

## 2023-08-25 DIAGNOSIS — R260 Ataxic gait: Secondary | ICD-10-CM | POA: Diagnosis not present

## 2023-08-25 DIAGNOSIS — I471 Supraventricular tachycardia, unspecified: Secondary | ICD-10-CM | POA: Diagnosis not present

## 2023-08-25 MED ORDER — DILTIAZEM HCL ER COATED BEADS 120 MG PO CP24: 120.0000 mg | ORAL_CAPSULE | Freq: Every day | ORAL | 3 refills | Status: DC

## 2023-08-25 NOTE — Progress Notes (Signed)
Cardiology Office Note:    Date:  08/25/2023   ID:  Kelly Clark, DOB 05-12-1929, MRN [id number]  PCP:  Ronnald Ramp, MD   [facility] Providers Cardiologist:  None     Referring MD: [name] Albino*   Chief Complaint  Patient presents with   Follow-up    Discuss cardiac testing results.  Patient reports that her symptoms of low energy and anxiety have worsened since last visit.      History of Present Illness:    Kelly Clark is a 89 y.o. female with a hx of hypertension, peripheral neuropathy, gait instability, presenting for follow-up.  Previously seen due to fatigue and staggering gait.  Echocardiogram, cardiac monitor was placed to evaluate any structural abnormalities or significant arrhythmias.  Cardiac monitor showed episodes of paroxysmal SVT and some nonsustained VT.  Patient was started on Cardizem to help suppress ectopy.  Norvasc was stopped.  Blood pressures are improved.  She still has symptoms of fatigue and staggering gait.  Saw neurology about a year ago due to neuropathy in her legs, was given vitamin B-12 with improvement in symptoms.  She denies chest pain or shortness of breath.  Worried about falling due to staggering gait.   Past Medical History:  Diagnosis Date   Basal cell carcinoma 2019   RIGHT THUMB REGION   Breast cancer (HCC) 06/27/2018   RIGHT   Cancer (HCC) 1977   melanoma . SURGERY RIGHT ARM   Hemorrhoids    Hypertension    Hypertriglyceridemia    Insomnia    Menopausal disorder    Neutropenia (HCC)    Paresthesia    Peripheral neuropathy    Tinea corporis    Vitamin D deficiency     Past Surgical History:  Procedure Laterality Date   ABDOMINAL HYSTERECTOMY  1977   BREAST BIOPSY Bilateral 1977   benign   BREAST BIOPSY Right 06/27/2018   INVASIVE MAMMARY CARCINOMA, NO SPECIAL TYPE. 2 oclock ER/PR negative HER2 positive   BREAST EXCISIONAL BIOPSY Right 07/23/2018   lumpectomy   BREAST LUMPECTOMY Right  2019   [facility]   BREAST LUMPECTOMY WITH SENTINEL LYMPH NODE BIOPSY Right 07/23/2018   6 mm ER/PR negative, Her 2 neu 3+; node negative.  Surgeon: Earline Mayotte, MD;  Location: [facility] ORS;  Service: General;  Laterality: Right;   COLONOSCOPY  2007   ELECTROCARDIOGRAM     EYE SURGERY Bilateral    CATARACT EXTRACTIONS    Current Medications: Current Meds  Medication Sig   Cholecalciferol (VITAMIN D3) 10000 units TABS Take 10,000 Units by mouth at bedtime.   clobetasol ointment (TEMOVATE) 0.05 % Apply 1 application topically once a week. Applied to affected area of vagina and rectum   gabapentin (NEURONTIN) 600 MG tablet Take 1 tablet (600 mg total) by mouth daily.   hydrALAZINE (APRESOLINE) 10 MG tablet TAKE 1 TABLET(10 MG) BY MOUTH TWICE DAILY   latanoprost (XALATAN) 0.005 % ophthalmic solution Place 1 drop into both eyes at bedtime.    losartan (COZAAR) 50 MG tablet Take 1 tablet (50 mg total) by mouth daily.   Multiple Vitamin (MULTIVITAMIN) tablet Take 1 tablet by mouth daily.   multivitamin-lutein (OCUVITE-LUTEIN) CAPS capsule Take 1 capsule by mouth every morning.    polyethylene glycol powder (GLYCOLAX/MIRALAX) 17 GM/SCOOP powder Take 17 g by mouth at bedtime.    RESVERATROL-GRAPE PO Take 1 capsule by mouth daily. CoQ10 Resveratrol & Grape Seed   Turmeric (QC TUMERIC COMPLEX) 500 MG CAPS Take  by mouth.   [DISCONTINUED] diltiazem (CARDIZEM CD) 120 MG 24 hr capsule Take 1 capsule (120 mg total) by mouth daily.     Allergies:   Patient has no known allergies.   Social History   Socioeconomic History   Marital status: Widowed    Spouse name: Not on file   Number of children: 1   Years of education: H/S   Highest education level: Some college, no degree  Occupational History   Occupation: Retired  Tobacco Use   Smoking status: Never   Smokeless tobacco: Never  Vaping Use   Vaping status: Never Used  Substance and Sexual Activity   Alcohol use: No   Drug use: No   Sexual  activity: Not Currently  Other Topics Concern   Not on file  Social History Narrative   Not on file   Social Determinants of Health   Financial Resource Strain: Low Risk  (03/20/2023)   Overall Financial Resource Strain (CARDIA)    Difficulty of Paying Living Expenses: Not hard at all  Food Insecurity: No Food Insecurity (03/20/2023)   Hunger Vital Sign    Worried About Running Out of Food in the Last Year: Never true    Ran Out of Food in the Last Year: Never true  Transportation Needs: No Transportation Needs (03/20/2023)   PRAPARE - Administrator, Civil Service (Medical): No    Lack of Transportation (Non-Medical): No  Physical Activity: Inactive (03/20/2023)   Exercise Vital Sign    Days of Exercise per Week: 0 days    Minutes of Exercise per Session: 60 min  Stress: No Stress Concern Present (03/20/2023)   [employer] of Occupational Health - Occupational Stress Questionnaire    Feeling of Stress : Not at all  Social Connections: Moderately Integrated (03/20/2023)   Social Connection and Isolation Panel [NHANES]    Frequency of Communication with Friends and Family: More than three times a week    Frequency of Social Gatherings with Friends and Family: Patient declined    Attends Religious Services: More than 4 times per year    Active Member of [employer] or Organizations: Yes    Attends Banker Meetings: Never    Marital Status: Widowed     Family History: The patient's family history includes Colon cancer (age of onset: 75) in her sister; Congestive Heart Failure in her mother; Healthy in her daughter; Heart attack in her mother; Stroke in her father; Throat cancer (age of onset: 71) in her maternal grandmother. There is no history of Breast cancer.  ROS:   Please see the history of present illness.     All other systems reviewed and are negative.  EKGs/Labs/Other Studies Reviewed:    The following studies were reviewed today:       Recent  Labs: 02/28/2023: BUN 17; Creatinine, Ser 0.99; Magnesium 2.6; Potassium 4.0; Sodium 143 06/06/2023: Hemoglobin 13.5; Platelets 176; TSH 1.400  Recent Lipid Panel    Component Value Date/Time   CHOL 149 09/22/2021 0955   TRIG 146 09/22/2021 0955   HDL 47 09/22/2021 0955   CHOLHDL 3.2 09/22/2021 0955   LDLCALC 77 09/22/2021 0955     Risk Assessment/Calculations:          Physical Exam:    VS:  BP (!) 156/80 (BP Location: Left Arm, Patient Position: Sitting, Cuff Size: Large)   Pulse 68   Ht 5\' 2"  (1.575 m)   Wt 182 lb (82.6 kg)   SpO2  98%   BMI 33.29 kg/m     Wt Readings from Last 3 Encounters:  08/25/23 182 lb (82.6 kg)  06/21/23 179 lb 12.8 oz (81.6 kg)  06/06/23 180 lb (81.6 kg)     GEN:  Well nourished, well developed in no acute distress HEENT: Normal NECK: No JVD; No carotid bruits CARDIAC: RRR, no murmurs, rubs, gallops RESPIRATORY:  Clear to auscultation without rales, wheezing or rhonchi  ABDOMEN: Soft, non-tender, non-distended MUSCULOSKELETAL:  No edema; No deformity  SKIN: Warm and dry NEUROLOGIC:  Alert and oriented x 3 PSYCHIATRIC:  Normal affect   ASSESSMENT:    1. Paroxysmal SVT (supraventricular tachycardia)   2. Other fatigue   3. Primary hypertension   4. Staggering gait    PLAN:    In order of problems listed above:  Fatigue, echo with normal EF 60 to 65%, impaired relaxation, no significant structural abnormalities.  Cardiac monitor showed paroxysmal SVT, nonsustained VT. continue Cardizem 120 mg daily.  Wants to follow-up with PCP regarding further refills for Cardizem. Hypertension. Slightly elevated, reasonable for age, avoid increasing BP meds due to risk of falls, staggering gait. Continue losartan 50 mg daily, Cardizem 120 mg daily. Staggering gait, unsure if this is in mobility or neurologic issue.  May need to follow-up with neurology due to history of peripheral neuropathy.  Follow-up with PCP for additional input.  Symptoms are  noncardiac.  Follow-up as needed.     Medication Adjustments/Labs and Tests Ordered: Current medicines are reviewed at length with the patient today.  Concerns regarding medicines are outlined above.  No orders of the defined types were placed in this encounter.  Meds ordered this encounter  Medications   diltiazem (CARDIZEM CD) 120 MG 24 hr capsule    Sig: Take 1 capsule (120 mg total) by mouth daily.    Dispense:  90 capsule    Refill:  3    Patient Instructions  Medication Instructions:   Your physician recommends that you continue on your current medications as directed. Please refer to the Current Medication list given to you today.  *If you need a refill on your cardiac medications before your next appointment, please call your pharmacy*   Lab Work:  None Ordered  If you have labs (blood work) drawn today and your tests are completely normal, you will receive your results only by: MyChart Message (if you have MyChart) OR A paper copy in the mail If you have any lab test that is abnormal or we need to change your treatment, we will call you to review the results.   Testing/Procedures:  None Ordered    Follow-Up: At [facility], you and your health needs are our priority.  As part of our continuing mission to provide you with exceptional heart care, we have created designated Provider Care Teams.  These Care Teams include your primary Cardiologist (physician) and Advanced Practice Providers (APPs -  Physician Assistants and Nurse Practitioners) who all work together to provide you with the care you need, when you need it.  We recommend signing up for the patient portal called "MyChart".  Sign up information is provided on this After Visit Summary.  MyChart is used to connect with patients for Virtual Visits (Telemedicine).  Patients are able to view lab/test results, encounter notes, upcoming appointments, etc.  Non-urgent messages can be sent to your  provider as well.   To learn more about what you can do with MyChart, go to [email or web address].  Your next appointment:    As needed    Signed, Debbe Odea, MD  08/25/2023 11:44 AM    [facility]

## 2023-08-25 NOTE — Patient Instructions (Signed)
Medication Instructions:   Your physician recommends that you continue on your current medications as directed. Please refer to the Current Medication list given to you today.  *If you need a refill on your cardiac medications before your next appointment, please call your pharmacy*   Lab Work:  None Ordered  If you have labs (blood work) drawn today and your tests are completely normal, you will receive your results only by: MyChart Message (if you have MyChart) OR A paper copy in the mail If you have any lab test that is abnormal or we need to change your treatment, we will call you to review the results.   Testing/Procedures:  None Ordered   Follow-Up: At [facility], you and your health needs are our priority.  As part of our continuing mission to provide you with exceptional heart care, we have created designated Provider Care Teams.  These Care Teams include your primary Cardiologist (physician) and Advanced Practice Providers (APPs -  Physician Assistants and Nurse Practitioners) who all work together to provide you with the care you need, when you need it.  We recommend signing up for the patient portal called "MyChart".  Sign up information is provided on this After Visit Summary.  MyChart is used to connect with patients for Virtual Visits (Telemedicine).  Patients are able to view lab/test results, encounter notes, upcoming appointments, etc.  Non-urgent messages can be sent to your provider as well.   To learn more about what you can do with MyChart, go to [email or web address].    Your next appointment:    As needed 

## 2023-08-26 ENCOUNTER — Other Ambulatory Visit: Payer: Self-pay | Admitting: Family Medicine

## 2023-09-06 ENCOUNTER — Ambulatory Visit (INDEPENDENT_AMBULATORY_CARE_PROVIDER_SITE_OTHER): Payer: PPO | Admitting: Family Medicine

## 2023-09-06 ENCOUNTER — Ambulatory Visit: Payer: PPO | Admitting: Family Medicine

## 2023-09-06 ENCOUNTER — Encounter: Payer: Self-pay | Admitting: Family Medicine

## 2023-09-06 VITALS — BP 158/78 | HR 88 | Ht 62.0 in | Wt 181.8 lb

## 2023-09-06 DIAGNOSIS — I471 Supraventricular tachycardia, unspecified: Secondary | ICD-10-CM

## 2023-09-06 DIAGNOSIS — R2689 Other abnormalities of gait and mobility: Secondary | ICD-10-CM | POA: Diagnosis not present

## 2023-09-06 DIAGNOSIS — T50905S Adverse effect of unspecified drugs, medicaments and biological substances, sequela: Secondary | ICD-10-CM | POA: Diagnosis not present

## 2023-09-06 DIAGNOSIS — I10 Essential (primary) hypertension: Secondary | ICD-10-CM

## 2023-09-06 NOTE — Progress Notes (Unsigned)
Established patient visit   Patient: Kelly Clark   DOB: Sep 25, 1929   89 y.o. Female  MRN: [id number] Visit Date: 09/06/2023  Today's healthcare provider: Jacky Kindle, FNP  Introduced to nurse practitioner role and practice setting.  All questions answered.  Discussed provider/patient relationship and expectations.  Subjective    HPI HPI     Medical Management of Chronic Issues    Additional comments: 3 month follow-up. Patient stopped taking dilatziem due to how it was making her feel and started to take her amlodipine again. Reports she taking amlodipine for one week. Patient reports she is still having fatigue, low grade temps in 97, and irritability but not as bad      Last edited by Acey Lav, CMA on 09/06/2023  1:23 PM.      Medications: Outpatient Medications Prior to Visit  Medication Sig   amLODipine (NORVASC) 5 MG tablet Take 5 mg by mouth daily.   Cholecalciferol (VITAMIN D3) 10000 units TABS Take 10,000 Units by mouth at bedtime.   clobetasol ointment (TEMOVATE) 0.05 % Apply 1 application topically once a week. Applied to affected area of vagina and rectum   gabapentin (NEURONTIN) 600 MG tablet Take 1 tablet (600 mg total) by mouth daily.   hydrALAZINE (APRESOLINE) 10 MG tablet TAKE 1 TABLET(10 MG) BY MOUTH TWICE DAILY   latanoprost (XALATAN) 0.005 % ophthalmic solution Place 1 drop into both eyes at bedtime.    losartan (COZAAR) 50 MG tablet Take 1 tablet (50 mg total) by mouth daily.   Multiple Vitamin (MULTIVITAMIN) tablet Take 1 tablet by mouth daily.   multivitamin-lutein (OCUVITE-LUTEIN) CAPS capsule Take 1 capsule by mouth every morning.    polyethylene glycol powder (GLYCOLAX/MIRALAX) 17 GM/SCOOP powder Take 17 g by mouth at bedtime.    RESVERATROL-GRAPE PO Take 1 capsule by mouth daily. CoQ10 Resveratrol & Grape Seed   Turmeric (QC TUMERIC COMPLEX) 500 MG CAPS Take by mouth.   diltiazem (CARDIZEM CD) 120 MG 24 hr capsule Take 1 capsule (120 mg  total) by mouth daily. (Patient not taking: Reported on 09/06/2023)   No facility-administered medications prior to visit.    Review of Systems      Objective    BP (!) 158/78 (BP Location: Left Arm, Patient Position: Sitting, Cuff Size: Normal)   Pulse 88   Ht 5\' 2"  (1.575 m)   Wt 181 lb 12.8 oz (82.5 kg)   SpO2 98%   BMI 33.25 kg/m   Physical Exam Vitals and nursing note reviewed.  Constitutional:      General: She is not in acute distress.    Appearance: Normal appearance. She is obese. She is not ill-appearing, toxic-appearing or diaphoretic.  HENT:     Head: Normocephalic and atraumatic.  Cardiovascular:     Rate and Rhythm: Normal rate and regular rhythm.     Pulses: Normal pulses.     Heart sounds: Normal heart sounds. No murmur heard.    No friction rub. No gallop.  Pulmonary:     Effort: Pulmonary effort is normal. No respiratory distress.     Breath sounds: Normal breath sounds. No stridor. No wheezing, rhonchi or rales.  Chest:     Chest wall: No tenderness.  Musculoskeletal:        General: No swelling, tenderness, deformity or signs of injury. Normal range of motion.     Right lower leg: No edema.     Left lower leg: No edema.  Skin:  General: Skin is warm and dry.     Capillary Refill: Capillary refill takes less than 2 seconds.     Coloration: Skin is not jaundiced or pale.     Findings: No bruising, erythema, lesion or rash.  Neurological:     Mental Status: She is alert and oriented to person, place, and time.     Cranial Nerves: No cranial nerve deficit.     Sensory: No sensory deficit.     Motor: No weakness.     Coordination: Coordination normal.     Gait: Gait abnormal.  Psychiatric:        Mood and Affect: Mood normal.        Behavior: Behavior normal.        Thought Content: Thought content normal.        Judgment: Judgment normal.      No results found for any visits on 09/06/23.  Assessment & Plan     Problem List Items  Addressed This Visit       Cardiovascular and Mediastinum   Paroxysmal SVT (supraventricular tachycardia)   Relevant Medications   amLODipine (NORVASC) 5 MG tablet   Primary hypertension - Primary    Chronic, remains elevated Pt reports use of dilt to assist with noted SVT on her Zio; however, reports side effects from medications including  Worsening fatigue Irritability (noted by family) And feeling like she was running a low grade temp Pt advised of need for medication; however, she prefers to got back on previous Rx of norvasc 5 mg and reports she will follow up with her PCP and/or cardiologist when she returns from her trip [place] Remains on losartan 50 and hydral at 10 mg BID      Relevant Medications   amLODipine (NORVASC) 5 MG tablet     Other   Abnormality of gait due to impairment of balance    Chronic, recommend additional neurology/PT workup once return Pt denies falls despite impaired gait      Medication side effects, sequela    Pt reports side effects from trial of dilt; continue to monitor need for medication similar given hx of SVT       No follow-ups on file.      Leilani Merl, FNP, have reviewed all documentation for this visit. The documentation on 09/07/23 for the exam, diagnosis, procedures, and orders are all accurate and complete.  Jacky Kindle, FNP  [facility] [phone] (phone) [phone] (fax)  [facility]

## 2023-09-07 DIAGNOSIS — T50905S Adverse effect of unspecified drugs, medicaments and biological substances, sequela: Secondary | ICD-10-CM | POA: Insufficient documentation

## 2023-09-07 DIAGNOSIS — I471 Supraventricular tachycardia, unspecified: Secondary | ICD-10-CM | POA: Insufficient documentation

## 2023-09-07 NOTE — Assessment & Plan Note (Signed)
Pt reports side effects from trial of dilt; continue to monitor need for medication similar given hx of SVT

## 2023-09-07 NOTE — Assessment & Plan Note (Signed)
Chronic, recommend additional neurology/PT workup once return Pt denies falls despite impaired gait

## 2023-09-07 NOTE — Assessment & Plan Note (Addendum)
Chronic, remains elevated Pt reports use of dilt to assist with noted SVT on her Zio; however, reports side effects from medications including  Worsening fatigue Irritability (noted by family) And feeling like she was running a low grade temp Pt advised of need for medication; however, she prefers to got back on previous Rx of norvasc 5 mg and reports she will follow up with her PCP and/or cardiologist when she returns from her trip [place] Remains on losartan 50 and hydral at 10 mg BID

## 2023-09-21 NOTE — Progress Notes (Signed)
Established patient visit   Patient: Kelly Clark   DOB: [birth year]   89 y.o. Female  MRN: [id number] Visit Date: 09/22/2023  Today's healthcare provider: Ronnald Ramp, MD   Chief Complaint  Patient presents with   Hypertension    Patient is currently taking Amlodipine, Losartan and Hydralazine.  She was told she could stop the Diltiazem the last time she was in the office until she was seen here again.  Patient also reports feeling exhausted in the afternoons.   Subjective     HPI     Hypertension    Additional comments: Patient is currently taking Amlodipine, Losartan and Hydralazine.  She was told she could stop the Diltiazem the last time she was in the office until she was seen here again.  Patient also reports feeling exhausted in the afternoons.      Last edited by Adline Peals, CMA on 09/22/2023  2:01 PM.       Discussed the use of AI scribe software for clinical note transcription with the patient, who gave verbal consent to proceed.  History of Present Illness   The patient, with a history of hypertension, presents with a significant decrease in energy levels and a feeling of exhaustion. She reports previously having high energy levels and feeling good all the time, but this has changed recently. The patient notes that she starts running a low-grade fever in the evenings, which is when her energy levels are particularly low. She denies any changes in her lifestyle or stress levels that could account for this change in energy.  The patient was previously prescribed Diltiazem by a cardiologist, but she stopped taking it due to feeling 'wiped out.' She switched back to Amlodipine, which she reports worked well for her. She has not resumed taking Diltiazem and is unsure if she should.  The patient also takes Hydralazine and Losartan for blood pressure control, which she reports taking religiously every day. She has noticed that her blood pressure has  been higher recently, with systolic readings in the 150s.  The patient denies any changes in her skin or cravings for salt. She also denies any feelings of sadness, depression, or anxiety. She reports no coughing, fevers, or night sweats. She notes that she experiences a low energy level every afternoon, but a short nap rejuvenates her.  The patient recently visited a chiropractor, which she reports helped lower her blood pressure. She denies any problems with sleep and reports no snoring or breathing issues during sleep. She has not noticed any changes in her weight.  The patient expresses frustration and distress over her decreased energy levels and change in her quality of life. She is seeking guidance on whether to resume Diltiazem or continue with Amlodipine.         Past Medical History:  Diagnosis Date   Basal cell carcinoma 2019   RIGHT THUMB REGION   Breast cancer (HCC) 06/27/2018   RIGHT   Cancer (HCC) 1977   melanoma . SURGERY RIGHT ARM   Hemorrhoids    Hypertension    Hypertriglyceridemia    Insomnia    Menopausal disorder    Neutropenia (HCC)    Paresthesia    Peripheral neuropathy    Tinea corporis    Vitamin D deficiency     Medications: Outpatient Medications Prior to Visit  Medication Sig   Cholecalciferol (VITAMIN D3) 10000 units TABS Take 10,000 Units by mouth at bedtime.   clobetasol ointment (TEMOVATE) 0.05 %  Apply 1 application topically once a week. Applied to affected area of vagina and rectum   gabapentin (NEURONTIN) 600 MG tablet Take 1 tablet (600 mg total) by mouth daily.   latanoprost (XALATAN) 0.005 % ophthalmic solution Place 1 drop into both eyes at bedtime.    losartan (COZAAR) 50 MG tablet Take 1 tablet (50 mg total) by mouth daily.   Multiple Vitamin (MULTIVITAMIN) tablet Take 1 tablet by mouth daily.   multivitamin-lutein (OCUVITE-LUTEIN) CAPS capsule Take 1 capsule by mouth every morning.    polyethylene glycol powder (GLYCOLAX/MIRALAX)  17 GM/SCOOP powder Take 17 g by mouth at bedtime.    RESVERATROL-GRAPE PO Take 1 capsule by mouth daily. CoQ10 Resveratrol & Grape Seed   Turmeric (QC TUMERIC COMPLEX) 500 MG CAPS Take by mouth.   [DISCONTINUED] amLODipine (NORVASC) 5 MG tablet Take 5 mg by mouth daily.   [DISCONTINUED] hydrALAZINE (APRESOLINE) 10 MG tablet TAKE 1 TABLET(10 MG) BY MOUTH TWICE DAILY   [DISCONTINUED] diltiazem (CARDIZEM CD) 120 MG 24 hr capsule Take 1 capsule (120 mg total) by mouth daily. (Patient not taking: Reported on 09/06/2023)   No facility-administered medications prior to visit.    Review of Systems  Last CBC Lab Results  Component Value Date   WBC 5.6 06/06/2023   HGB 13.5 06/06/2023   HCT 39.5 06/06/2023   MCV 91 06/06/2023   MCH 31.3 06/06/2023   RDW 13.1 06/06/2023   PLT 176 06/06/2023   Last metabolic panel Lab Results  Component Value Date   GLUCOSE 89 02/28/2023   NA 143 02/28/2023   K 4.0 02/28/2023   CL 103 02/28/2023   CO2 25 02/28/2023   BUN 17 02/28/2023   CREATININE 0.99 02/28/2023   EGFR 54 (L) 02/28/2023   CALCIUM 9.9 02/28/2023   PHOS 4.0 09/19/2018   PROT 6.1 09/22/2021   ALBUMIN 4.2 08/16/2022   LABGLOB 2.2 09/22/2021   AGRATIO 1.8 09/22/2021   BILITOT 0.6 09/22/2021   ALKPHOS 50 08/16/2022   AST 19 08/16/2022   ALT 15 08/16/2022   ANIONGAP 8 07/09/2018   Last lipids Lab Results  Component Value Date   CHOL 149 09/22/2021   HDL 47 09/22/2021   LDLCALC 77 09/22/2021   TRIG 146 09/22/2021   CHOLHDL 3.2 09/22/2021   Last hemoglobin A1c Lab Results  Component Value Date   HGBA1C 5.8 08/16/2022   Last thyroid functions Lab Results  Component Value Date   TSH 1.400 06/06/2023   Last vitamin D No results found for: "25OHVITD2", "25OHVITD3", "VD25OH" Last vitamin B12 and Folate Lab Results  Component Value Date   VITAMINB12 804 06/06/2023        Objective    BP (!) 150/67 (BP Location: Left Arm, Patient Position: Sitting, Cuff Size: Normal)    Pulse 82   Temp 98.8 F (37.1 C) (Oral)   Ht 5\' 2"  (1.575 m)   Wt 183 lb (83 kg)   SpO2 97%   BMI 33.47 kg/m    BP Readings from Last 3 Encounters:  09/22/23 (!) 150/67  09/06/23 (!) 158/78  08/25/23 (!) 156/80   Wt Readings from Last 3 Encounters:  09/22/23 183 lb (83 kg)  09/06/23 181 lb 12.8 oz (82.5 kg)  08/25/23 182 lb (82.6 kg)       Physical Exam Vitals reviewed.  Constitutional:      General: She is not in acute distress.    Appearance: Normal appearance. She is not ill-appearing, toxic-appearing or diaphoretic.  Eyes:  Conjunctiva/sclera: Conjunctivae normal.  Cardiovascular:     Rate and Rhythm: Normal rate and regular rhythm.     Pulses: Normal pulses.     Heart sounds: Normal heart sounds. No murmur heard.    No friction rub. No gallop.  Pulmonary:     Effort: Pulmonary effort is normal. No respiratory distress.     Breath sounds: Normal breath sounds. No stridor. No wheezing, rhonchi or rales.  Abdominal:     General: Bowel sounds are normal. There is no distension.     Palpations: Abdomen is soft.     Tenderness: There is no abdominal tenderness.  Musculoskeletal:     Right lower leg: No edema.     Left lower leg: No edema.  Skin:    Findings: No erythema or rash.  Neurological:     Mental Status: She is alert and oriented to person, place, and time.       No results found for any visits on 09/22/23.  Assessment & Plan     Problem List Items Addressed This Visit     Other fatigue    New onset, associated with low-grade fever in the evenings. No changes in sleep, mood, or skin. No cough, fever, or night sweats. Labs from March were normal, including thyroid function. -Order labs including TSH, T4, T3, complete metabolic panel, hemoglobin A1c, sedimentation rate, vitamin D, vitamin B12, CBC, and BNP. -Review results at next visit on 10/16/2023.      Relevant Orders   Hemoglobin A1c   CMP14+EGFR   TSH+T4F+T3Free   Sedimentation rate    VITAMIN D 25 Hydroxy (Vit-D Deficiency, Fractures)   Vitamin B12   Brain natriuretic peptide   CBC   Primary hypertension - Primary    Systolic blood pressure elevated at 150. Patient reports fatigue and low energy. Previously on Diltiazem 120mg , which was stopped due to side effects. Currently on Amlodipine 5mg , Hydralazine 10mg  BID, and Losartan 50mg  daily. -Increase Hydralazine to 10mg  TID. -Continue Amlodipine 5mg  daily and Losartan 50mg  daily. -Discontinue Diltiazem. -Check blood pressure at next visit on 10/16/2023.      Relevant Medications   hydrALAZINE (APRESOLINE) 10 MG tablet   amLODipine (NORVASC) 5 MG tablet   Other Relevant Orders   CMP14+EGFR   TSH+T4F+T3Free         Return in about 2 weeks (around 10/06/2023) for HTN.         Ronnald Ramp, MD  [facility] [phone] (phone) [phone] (fax)  [facility]

## 2023-09-22 ENCOUNTER — Ambulatory Visit (INDEPENDENT_AMBULATORY_CARE_PROVIDER_SITE_OTHER): Payer: PPO | Admitting: Family Medicine

## 2023-09-22 ENCOUNTER — Encounter: Payer: Self-pay | Admitting: Family Medicine

## 2023-09-22 VITALS — BP 150/67 | HR 82 | Temp 98.8°F | Ht 62.0 in | Wt 183.0 lb

## 2023-09-22 DIAGNOSIS — I10 Essential (primary) hypertension: Secondary | ICD-10-CM | POA: Diagnosis not present

## 2023-09-22 DIAGNOSIS — R5383 Other fatigue: Secondary | ICD-10-CM | POA: Insufficient documentation

## 2023-09-22 MED ORDER — HYDRALAZINE HCL 10 MG PO TABS: 10.0000 mg | ORAL_TABLET | Freq: Three times a day (TID) | ORAL | Status: DC

## 2023-09-22 MED ORDER — AMLODIPINE BESYLATE 5 MG PO TABS: 5.0000 mg | ORAL_TABLET | Freq: Every day | ORAL | 1 refills | Status: DC

## 2023-09-22 NOTE — Assessment & Plan Note (Signed)
New onset, associated with low-grade fever in the evenings. No changes in sleep, mood, or skin. No cough, fever, or night sweats. Labs from March were normal, including thyroid function. -Order labs including TSH, T4, T3, complete metabolic panel, hemoglobin A1c, sedimentation rate, vitamin D, vitamin B12, CBC, and BNP. -Review results at next visit on 10/16/2023.

## 2023-09-22 NOTE — Patient Instructions (Signed)
VISIT SUMMARY:  During your visit, we discussed your concerns about your decreased energy levels and high blood pressure. We also talked about your medications and made some changes to help manage your symptoms better.  YOUR PLAN:  -HYPERTENSION: Hypertension, or high blood pressure, is a condition where the force of blood against your artery walls is too high. We have decided to increase your Hydralazine dosage to 10mg  three times a day. Continue taking Amlodipine 5mg  daily and Losartan 50mg  daily. We will not be resuming Diltiazem. We will check your blood pressure at your next visit.  -FATIGUE: Fatigue is a feeling of constant tiredness or weakness, which can be physical, mental or both. We will be ordering several lab tests, including tests for your thyroid function, blood sugar levels, inflammation, vitamin levels, and heart function. We will review these results at your next visit.  INSTRUCTIONS:  Please remember to take your Hydralazine three times a day. Continue taking your Amlodipine and Losartan as usual. We have ordered several lab tests for you. Please make sure to get these done before your next visit on 10/16/2023, when we will review the results and check your blood pressure.

## 2023-09-22 NOTE — Assessment & Plan Note (Signed)
Systolic blood pressure elevated at 150. Patient reports fatigue and low energy. Previously on Diltiazem 120mg , which was stopped due to side effects. Currently on Amlodipine 5mg , Hydralazine 10mg  BID, and Losartan 50mg  daily. -Increase Hydralazine to 10mg  TID. -Continue Amlodipine 5mg  daily and Losartan 50mg  daily. -Discontinue Diltiazem. -Check blood pressure at next visit on 10/16/2023.

## 2023-10-06 ENCOUNTER — Ambulatory Visit (INDEPENDENT_AMBULATORY_CARE_PROVIDER_SITE_OTHER): Payer: PPO | Admitting: Family Medicine

## 2023-10-06 ENCOUNTER — Encounter: Payer: Self-pay | Admitting: Family Medicine

## 2023-10-06 VITALS — BP 157/80 | HR 70 | Temp 97.9°F | Ht 62.4 in | Wt 182.4 lb

## 2023-10-06 DIAGNOSIS — I10 Essential (primary) hypertension: Secondary | ICD-10-CM

## 2023-10-06 DIAGNOSIS — Z23 Encounter for immunization: Secondary | ICD-10-CM | POA: Diagnosis not present

## 2023-10-06 NOTE — Assessment & Plan Note (Signed)
Home blood pressure readings mostly in the 130s-140s systolic, with a few readings in the 150s. Patient reports feeling well on current regimen of Amlodipine 5mg  daily, Losartan 50mg  daily, and Hydralazine 10mg  three times daily. No symptoms of hypotension reported. Chronic, home BP records within goal range of less than 150/90 -Continue current antihypertensive regimen. -Check blood pressure at home and maintain a log. -Return to clinic or contact provider if blood pressure readings consistently exceed 160 systolic.

## 2023-10-06 NOTE — Progress Notes (Signed)
Established patient visit   Patient: Kelly Clark   DOB: [birth year]   89 y.o. Female  MRN: [id number] Visit Date: 10/06/2023  Today's healthcare provider: Ronnald Ramp, MD   Chief Complaint  Patient presents with   Follow-up    HTN follow up   Subjective     HPI     Follow-up    Additional comments: HTN follow up      Last edited by Derinda Late A on 10/06/2023 11:00 AM.       Discussed the use of AI scribe software for clinical note transcription with the patient, who gave verbal consent to proceed.  History of Present Illness   The patient, with a history of hypertension, presented for a follow-up visit after recent medication changes. She reported feeling significantly better since the last visit, with no symptoms of dizziness or lightheadedness. The patient had been monitoring her blood pressure at home, with readings mostly in the 130s-140s systolic and 70s-80s diastolic. She noted that the higher readings occurred after starting a third dose of hydralazine.  The patient had previously been on diltiazem, which was discontinued due to feelings of exhaustion. She reported feeling much better since switching to amlodipine, despite being under significant stress at the time of the switch. The patient expressed satisfaction with her current regimen, which also includes losartan and hydralazine, and reported feeling more "normal" than she had in a while.  The patient also mentioned a recent incident where she stepped in a bed of fire ants, resulting in bites from the foot to the knee. She reported that the bites were healing well. The patient also shared that she had recently returned from a trip to [place], which she greatly enjoyed.         Past Medical History:  Diagnosis Date   Basal cell carcinoma 2019   RIGHT THUMB REGION   Breast cancer (HCC) 06/27/2018   RIGHT   Cancer (HCC) 1977   melanoma . SURGERY RIGHT ARM   Hemorrhoids    Hypertension     Hypertriglyceridemia    Insomnia    Menopausal disorder    Neutropenia (HCC)    Paresthesia    Peripheral neuropathy    Tinea corporis    Vitamin D deficiency     Medications: Outpatient Medications Prior to Visit  Medication Sig   amLODipine (NORVASC) 5 MG tablet Take 1 tablet (5 mg total) by mouth daily.   Cholecalciferol (VITAMIN D3) 10000 units TABS Take 10,000 Units by mouth at bedtime.   clobetasol ointment (TEMOVATE) 0.05 % Apply 1 application topically once a week. Applied to affected area of vagina and rectum   gabapentin (NEURONTIN) 600 MG tablet Take 1 tablet (600 mg total) by mouth daily.   hydrALAZINE (APRESOLINE) 10 MG tablet Take 1 tablet (10 mg total) by mouth 3 (three) times daily.   latanoprost (XALATAN) 0.005 % ophthalmic solution Place 1 drop into both eyes at bedtime.    losartan (COZAAR) 50 MG tablet Take 1 tablet (50 mg total) by mouth daily.   Multiple Vitamin (MULTIVITAMIN) tablet Take 1 tablet by mouth daily.   multivitamin-lutein (OCUVITE-LUTEIN) CAPS capsule Take 1 capsule by mouth every morning.    polyethylene glycol powder (GLYCOLAX/MIRALAX) 17 GM/SCOOP powder Take 17 g by mouth at bedtime.    RESVERATROL-GRAPE PO Take 1 capsule by mouth daily. CoQ10 Resveratrol & Grape Seed   Turmeric (QC TUMERIC COMPLEX) 500 MG CAPS Take by mouth.   No facility-administered  medications prior to visit.    Review of Systems      Objective    BP (!) 157/80 (BP Location: Left Arm, Patient Position: Sitting, Cuff Size: Normal)   Pulse 70   Temp 97.9 F (36.6 C) (Oral)   Ht 5' 2.4" (1.585 m)   Wt 182 lb 6.4 oz (82.7 kg)   SpO2 96%   BMI 32.94 kg/m     Physical Exam Vitals reviewed.  Constitutional:      General: She is not in acute distress.    Appearance: Normal appearance. She is not ill-appearing, toxic-appearing or diaphoretic.  Eyes:     Conjunctiva/sclera: Conjunctivae normal.  Cardiovascular:     Rate and Rhythm: Normal rate and regular  rhythm.     Pulses: Normal pulses.     Heart sounds: Normal heart sounds. No murmur heard.    No friction rub. No gallop.  Pulmonary:     Effort: Pulmonary effort is normal. No respiratory distress.     Breath sounds: Normal breath sounds. No stridor. No wheezing, rhonchi or rales.  Abdominal:     General: Bowel sounds are normal. There is no distension.     Palpations: Abdomen is soft.     Tenderness: There is no abdominal tenderness.  Musculoskeletal:     Right lower leg: Edema present.     Left lower leg: Edema present.     Comments: Non pitting BLE, right LE with healing bites from ants   Skin:    Findings: No erythema or rash.  Neurological:     Mental Status: She is alert and oriented to person, place, and time.       No results found for any visits on 10/06/23.  Assessment & Plan     Problem List Items Addressed This Visit     Primary hypertension - Primary    Home blood pressure readings mostly in the 130s-140s systolic, with a few readings in the 150s. Patient reports feeling well on current regimen of Amlodipine 5mg  daily, Losartan 50mg  daily, and Hydralazine 10mg  three times daily. No symptoms of hypotension reported. Chronic, home BP records within goal range of less than 150/90 -Continue current antihypertensive regimen. -Check blood pressure at home and maintain a log. -Return to clinic or contact provider if blood pressure readings consistently exceed 160 systolic.          Other Visit Diagnoses     Encounter for immunization       Relevant Orders   Flu Vaccine Trivalent High Dose (Fluad) (Completed)         Return in about 4 months (around 02/06/2024) for CHRONIC F/U.         Ronnald Ramp, MD  [facility] [phone] (phone) [phone] (fax)  [facility]

## 2023-10-18 ENCOUNTER — Ambulatory Visit (INDEPENDENT_AMBULATORY_CARE_PROVIDER_SITE_OTHER): Payer: PPO

## 2023-10-18 VITALS — Ht 62.0 in | Wt 182.0 lb

## 2023-10-18 DIAGNOSIS — Z0000 Encounter for general adult medical examination without abnormal findings: Secondary | ICD-10-CM

## 2023-10-18 NOTE — Patient Instructions (Signed)
Kelly Clark , Thank you for taking time to come for your Medicare Wellness Visit. I appreciate your ongoing commitment to your health goals. Please review the following plan we discussed and let me know if I can assist you in the future.   Referrals/Orders/Follow-Ups/Clinician Recommendations: Aim for 30 minutes of exercise or brisk walking, 6-8 glasses of water, and 5 servings of fruits and vegetables each day.  This is a list of the screening recommended for you and due dates:  Health Maintenance  Topic Date Due   Zoster (Shingles) Vaccine (1 of 2) Never done   COVID-19 Vaccine (3 - Moderna risk series) 02/25/2020   Medicare Annual Wellness Visit  10/17/2024   DTaP/Tdap/Td vaccine (3 - Td or Tdap) 01/29/2032   Pneumonia Vaccine  Completed   Flu Shot  Completed   DEXA scan (bone density measurement)  Completed   HPV Vaccine  Aged Out    Advanced directives: (ACP Link)Information on Advanced Care Planning can be found at [facility] [employer] Advance Health Care Directives Advance Health Care Directives ([email or web address]   Next Medicare Annual Wellness Visit scheduled for next year: Yes

## 2023-10-18 NOTE — Progress Notes (Signed)
Subjective:   Kelly Clark is a 89 y.o. female who presents for Medicare Annual (Subsequent) preventive examination.  Visit Complete: Virtual I connected with  Kelly Clark on 10/18/23 by a audio enabled telemedicine application and verified that I am speaking with the correct person using two identifiers.  Patient Location: Home  Provider Location: Home Office  I discussed the limitations of evaluation and management by telemedicine. The patient expressed understanding and agreed to proceed.  Vital Signs: Because this visit was a virtual/telehealth visit, some criteria may be missing or patient reported. Any vitals not documented were not able to be obtained and vitals that have been documented are patient reported.  Cardiac Risk Factors include: advanced age (>13men, >44 women);hypertension     Objective:    Today's Vitals   10/18/23 1242  Weight: 182 lb (82.6 kg)  Height: 5\' 2"  (1.575 m)   Body mass index is 33.29 kg/m.     10/18/2023   12:46 PM 10/13/2022   11:43 AM 08/24/2022   11:47 AM 09/24/2020    9:50 AM 09/23/2019    8:41 AM 11/02/2018    2:00 PM 09/19/2018    9:59 AM  Advanced Directives  Does Patient Have a Medical Advance Directive? No No Yes Yes Yes Yes Yes  Type of Chief of Staff of [place];Living will Healthcare Power of [place];Living will  Healthcare Power of [place];Living will  Copy of Healthcare Power of Attorney in Chart?    No - copy requested Yes - validated most recent copy scanned in chart (See row information)  No - copy requested  Would patient like information on creating a medical advance directive? Yes (MAU/Ambulatory/Procedural Areas - Information given) No - Patient declined         Current Medications (verified) Outpatient Encounter Medications as of 10/18/2023  Medication Sig   amLODipine (NORVASC) 5 MG tablet Take 1 tablet (5 mg total) by mouth daily.   Cholecalciferol (VITAMIN D3)  10000 units TABS Take 10,000 Units by mouth at bedtime.   clobetasol ointment (TEMOVATE) 0.05 % Apply 1 application topically once a week. Applied to affected area of vagina and rectum   gabapentin (NEURONTIN) 600 MG tablet Take 1 tablet (600 mg total) by mouth daily.   hydrALAZINE (APRESOLINE) 10 MG tablet Take 1 tablet (10 mg total) by mouth 3 (three) times daily.   latanoprost (XALATAN) 0.005 % ophthalmic solution Place 1 drop into both eyes at bedtime.    losartan (COZAAR) 50 MG tablet Take 1 tablet (50 mg total) by mouth daily.   Multiple Vitamin (MULTIVITAMIN) tablet Take 1 tablet by mouth daily.   multivitamin-lutein (OCUVITE-LUTEIN) CAPS capsule Take 1 capsule by mouth every morning.    polyethylene glycol powder (GLYCOLAX/MIRALAX) 17 GM/SCOOP powder Take 17 g by mouth at bedtime.    RESVERATROL-GRAPE PO Take 1 capsule by mouth daily. CoQ10 Resveratrol & Grape Seed   Turmeric (QC TUMERIC COMPLEX) 500 MG CAPS Take by mouth.   No facility-administered encounter medications on file as of 10/18/2023.    Allergies (verified) Patient has no known allergies.   History: Past Medical History:  Diagnosis Date   Basal cell carcinoma 2019   RIGHT THUMB REGION   Breast cancer (HCC) 06/27/2018   RIGHT   Cancer (HCC) 1977   melanoma . SURGERY RIGHT ARM   Hemorrhoids    Hypertension    Hypertriglyceridemia    Insomnia    Menopausal disorder  Neutropenia (HCC)    Paresthesia    Peripheral neuropathy    Tinea corporis    Vitamin D deficiency    Past Surgical History:  Procedure Laterality Date   ABDOMINAL HYSTERECTOMY  1977   BREAST BIOPSY Bilateral 1977   benign   BREAST BIOPSY Right 06/27/2018   INVASIVE MAMMARY CARCINOMA, NO SPECIAL TYPE. 2 oclock ER/PR negative HER2 positive   BREAST EXCISIONAL BIOPSY Right 07/23/2018   lumpectomy   BREAST LUMPECTOMY Right 2019   [facility]   BREAST LUMPECTOMY WITH SENTINEL LYMPH NODE BIOPSY Right 07/23/2018   6 mm ER/PR negative, Her 2 neu 3+;  node negative.  Surgeon: Earline Mayotte, MD;  Location: [facility] ORS;  Service: General;  Laterality: Right;   COLONOSCOPY  2007   ELECTROCARDIOGRAM     EYE SURGERY Bilateral    CATARACT EXTRACTIONS   Family History  Problem Relation Age of Onset   Heart attack Mother    Congestive Heart Failure Mother    Stroke Father    Colon cancer Sister 76   Throat cancer Maternal Grandmother 62   Healthy Daughter    Breast cancer Neg Hx    Social History   Socioeconomic History   Marital status: Widowed    Spouse name: Not on file   Number of children: 1   Years of education: H/S   Highest education level: Some college, no degree  Occupational History   Occupation: Retired  Tobacco Use   Smoking status: Never   Smokeless tobacco: Never  Vaping Use   Vaping status: Never Used  Substance and Sexual Activity   Alcohol use: Never   Drug use: Never   Sexual activity: Not Currently    Comment: Had hysterectomy in 1977  Other Topics Concern   Not on file  Social History Narrative   Not on file   Social Determinants of Health   Financial Resource Strain: Low Risk  (10/17/2023)   Overall Financial Resource Strain (CARDIA)    Difficulty of Paying Living Expenses: Not hard at all  Food Insecurity: No Food Insecurity (10/17/2023)   Hunger Vital Sign    Worried About Running Out of Food in the Last Year: Never true    Ran Out of Food in the Last Year: Never true  Transportation Needs: No Transportation Needs (10/17/2023)   PRAPARE - Administrator, Civil Service (Medical): No    Lack of Transportation (Non-Medical): No  Physical Activity: Inactive (10/17/2023)   Exercise Vital Sign    Days of Exercise per Week: 0 days    Minutes of Exercise per Session: 0 min  Stress: No Stress Concern Present (10/18/2023)   [employer] of Occupational Health - Occupational Stress Questionnaire    Feeling of Stress : Not at all  Social Connections: Moderately Integrated  (10/17/2023)   Social Connection and Isolation Panel [NHANES]    Frequency of Communication with Friends and Family: More than three times a week    Frequency of Social Gatherings with Friends and Family: Once a week    Attends Religious Services: More than 4 times per year    Active Member of [employer] or Organizations: Yes    Attends Banker Meetings: More than 4 times per year    Marital Status: Widowed    Tobacco Counseling Counseling given: Not Answered   Clinical Intake:  Pre-visit preparation completed: Yes  Pain : No/denies pain     Diabetes: No  How often do you need to  have someone help you when you read instructions, pamphlets, or other written materials from your doctor or pharmacy?: 1 - Never  Interpreter Needed?: No  Information entered by :: Kandis Fantasia LPN   Activities of Daily Living    10/17/2023   12:38 PM 10/12/2023   10:36 AM  In your present state of health, do you have any difficulty performing the following activities:  Hearing? 0 0  Vision? 0 0  Difficulty concentrating or making decisions? 0 0  Walking or climbing stairs? 0 0  Dressing or bathing? 0 0  Doing errands, shopping? 0 0  Preparing Food and eating ? N N  Using the Toilet? N N  In the past six months, have you accidently leaked urine? Y Y  Do you have problems with loss of bowel control? N N  Managing your Medications? N N  Managing your Finances? N N  Housekeeping or managing your Housekeeping? N N    Patient Care Team: Ronnald Ramp, MD as PCP - General (Family Medicine) Irene Limbo., MD as Consulting Physician (Ophthalmology) Debbrah Alar, MD as Consulting Physician (Dermatology) Lemar Livings Merrily Pew, MD as Consulting Physician (General Surgery)  Indicate any recent Medical Services you may have received from other than [facility] providers in the past year (date may be approximate).     Assessment:   This is a routine wellness examination for  [place].  Hearing/Vision screen Hearing Screening - Comments:: Denies hearing difficulties   Vision Screening - Comments:: Wears rx glasses - up to date with routine eye exams with Dr. Hulen Luster     Goals Addressed   None   Depression Screen    10/18/2023   12:44 PM 10/06/2023   11:05 AM 09/06/2023    1:25 PM 04/13/2023    1:20 PM 02/08/2023   10:12 AM 10/13/2022   11:40 AM 09/29/2021   10:34 AM  PHQ 2/9 Scores  PHQ - 2 Score 0 0 0 0 0 0 0  PHQ- 9 Score    0 0 0 0    Fall Risk    10/18/2023   12:47 PM 10/17/2023   12:38 PM 10/12/2023   10:36 AM 10/06/2023   11:05 AM 09/06/2023    1:24 PM  Fall Risk   Falls in the past year? 0 0 0 0 0  Number falls in past yr: 0   0   Injury with Fall? 0   0 0  Risk for fall due to : No Fall Risks   No Fall Risks No Fall Risks  Follow up Falls prevention discussed;Education provided;Falls evaluation completed   Falls evaluation completed Falls evaluation completed    MEDICARE RISK AT HOME: Medicare Risk at Home Any stairs in or around the home?: Yes If so, are there any without handrails?: No Home free of loose throw rugs in walkways, pet beds, electrical cords, etc?: Yes Adequate lighting in your home to reduce risk of falls?: Yes Life alert?: Yes Use of a cane, walker or w/c?: No Grab bars in the bathroom?: No Shower chair or bench in shower?: No Elevated toilet seat or a handicapped toilet?: No  TIMED UP AND GO:  Was the test performed?  No    Cognitive Function:        10/18/2023   12:47 PM 10/13/2022   11:44 AM 09/24/2020    9:56 AM 09/23/2019    8:50 AM 07/06/2017   10:42 AM  6CIT Screen  What Year? 0 points 0  points 0 points 0 points 0 points  What month? 0 points 0 points 0 points 0 points 0 points  What time? 0 points 0 points 0 points 0 points 0 points  Count back from 20 0 points 0 points 0 points 0 points 0 points  Months in reverse 0 points 0 points 0 points 0 points 0 points  Repeat phrase 2 points 2  points 2 points 0 points 0 points  Total Score 2 points 2 points 2 points 0 points 0 points    Immunizations Immunization History  Administered Date(s) Administered   Fluad Quad(high Dose 65+) 09/24/2019, 09/28/2020, 09/29/2021, 09/06/2022   Fluad Trivalent(High Dose 65+) 10/06/2023   Influenza, High Dose Seasonal PF 09/23/2015, 10/05/2016, 11/01/2017, 09/19/2018   Moderna Sars-Covid-2 Vaccination 12/31/2019, 01/28/2020   Pneumococcal Conjugate-13 08/01/2014   Pneumococcal Polysaccharide-23 06/03/1999   Td 01/18/2007   Tdap 01/28/2022    TDAP status: Up to date  Flu Vaccine status: Up to date  Pneumococcal vaccine status: Up to date  Covid-19 vaccine status: Information provided on how to obtain vaccines.   Qualifies for Shingles Vaccine? No   Zostavax completed No   Shingrix Completed?: No.    Education has been provided regarding the importance of this vaccine. Patient has been advised to call insurance company to determine out of pocket expense if they have not yet received this vaccine. Advised may also receive vaccine at local pharmacy or Health Dept. Verbalized acceptance and understanding.  Screening Tests Health Maintenance  Topic Date Due   Zoster Vaccines- Shingrix (1 of 2) Never done   COVID-19 Vaccine (3 - Moderna risk series) 02/25/2020   Medicare Annual Wellness (AWV)  10/17/2024   DTaP/Tdap/Td (3 - Td or Tdap) 01/29/2032   Pneumonia Vaccine 34+ Years old  Completed   INFLUENZA VACCINE  Completed   DEXA SCAN  Completed   HPV VACCINES  Aged Out    Health Maintenance  Health Maintenance Due  Topic Date Due   Zoster Vaccines- Shingrix (1 of 2) Never done   COVID-19 Vaccine (3 - Moderna risk series) 02/25/2020    Colorectal cancer screening: No longer required.   Mammogram status: No longer required due to age and preference.  Bone Density status: Completed 10/06/10. Results reflect: Bone density results: NORMAL. Repeat every 5 years.  Lung Cancer  Screening: (Low Dose CT Chest recommended if Age 22-80 years, 20 pack-year currently smoking OR have quit w/in 15years.) does not qualify.   Lung Cancer Screening Referral: n/a  Additional Screening:  Hepatitis C Screening: does not qualify  Vision Screening: Recommended annual ophthalmology exams for early detection of glaucoma and other disorders of the eye. Is the patient up to date with their annual eye exam?  Yes  Who is the provider or what is the name of the office in which the patient attends annual eye exams? Dr. Alvester Morin  If pt is not established with a provider, would they like to be referred to a provider to establish care? No .   Dental Screening: Recommended annual dental exams for proper oral hygiene  Community Resource Referral / Chronic Care Management: CRR required this visit?  No   CCM required this visit?  No     Plan:     I have personally reviewed and noted the following in the patient's chart:   Medical and social history Use of alcohol, tobacco or illicit drugs  Current medications and supplements including opioid prescriptions. Patient is not currently taking opioid prescriptions. Functional  ability and status Nutritional status Physical activity Advanced directives List of other physicians Hospitalizations, surgeries, and ER visits in previous 12 months Vitals Screenings to include cognitive, depression, and falls Referrals and appointments  In addition, I have reviewed and discussed with patient certain preventive protocols, quality metrics, and best practice recommendations. A written personalized care plan for preventive services as well as general preventive health recommendations were provided to patient.     Kandis Fantasia [place], [place]   [id number]   After Visit Summary: (MyChart) Due to this being a telephonic visit, the after visit summary with patients personalized plan was offered to patient via MyChart   Nurse Notes: Patient states  that she is having intermittent pains in the right side of her neck.  Will notify office if this continues or becomes more frequent/bothersome.

## 2023-10-19 DIAGNOSIS — H40153 Residual stage of open-angle glaucoma, bilateral: Secondary | ICD-10-CM | POA: Diagnosis not present

## 2023-10-26 ENCOUNTER — Other Ambulatory Visit: Payer: Self-pay | Admitting: Family Medicine

## 2023-10-26 DIAGNOSIS — I10 Essential (primary) hypertension: Secondary | ICD-10-CM

## 2023-10-27 ENCOUNTER — Other Ambulatory Visit: Payer: Self-pay | Admitting: Family Medicine

## 2023-10-27 NOTE — Telephone Encounter (Signed)
Requested medication (s) are due for refill today: {?  Requested medication (s) are on the active medication list:yes  Last refill:  09/22/23  Future visit scheduled:yes  Notes to clinic:  unsure of the amount dispensed last fill   Requested Prescriptions  Pending Prescriptions Disp Refills   hydrALAZINE (APRESOLINE) 10 MG tablet [Pharmacy Med Name: HYDRALAZINE 10 MG TABLETS (ORANGE)] 180 tablet     Sig: TAKE 1 TABLET(10 MG) BY MOUTH TWICE DAILY     Cardiovascular:  Vasodilators Failed - 10/27/2023  9:59 AM      Failed - ANA Screen, Ifa, Serum in normal range and within 360 days    No results found for: "ANA", "ANATITER", "LABANTI"       Failed - Last BP in normal range    BP Readings from Last 1 Encounters:  10/06/23 (!) 157/80         Passed - HCT in normal range and within 360 days    Hematocrit  Date Value Ref Range Status  06/06/2023 39.5 34.0 - 46.6 % Final         Passed - HGB in normal range and within 360 days    Hemoglobin  Date Value Ref Range Status  06/06/2023 13.5 11.1 - 15.9 g/dL Final         Passed - RBC in normal range and within 360 days    RBC  Date Value Ref Range Status  06/06/2023 4.32 3.77 - 5.28 x10E6/uL Final  08/16/2022 4.47 3.87 - 5.11 Final         Passed - WBC in normal range and within 360 days    WBC  Date Value Ref Range Status  06/06/2023 5.6 3.4 - 10.8 x10E3/uL Final  07/09/2018 5.5 3.6 - 11.0 K/uL Final         Passed - PLT in normal range and within 360 days    Platelets  Date Value Ref Range Status  06/06/2023 176 150 - 450 x10E3/uL Final         Passed - Valid encounter within last 12 months    Recent Outpatient Visits           3 weeks ago Primary hypertension   [facility] Simmons-Robinson, [place], MD   1 month ago Primary hypertension   [facility] [place], [place], MD   1 month ago Primary hypertension   [facility]  Merita Norton T, FNP   4 months ago Benign essential HTN    [facility] Simmons-Robinson, [place], MD   6 months ago Lightheadedness   [facility] Alfredia Ferguson, PA-C       Future Appointments             In 3 months Simmons-Robinson, Tawanna Cooler, MD [facility]

## 2023-11-01 ENCOUNTER — Telehealth: Payer: Self-pay

## 2023-11-01 DIAGNOSIS — I10 Essential (primary) hypertension: Secondary | ICD-10-CM

## 2023-11-01 MED ORDER — HYDRALAZINE HCL 10 MG PO TABS
10.0000 mg | ORAL_TABLET | Freq: Three times a day (TID) | ORAL | 1 refills | Status: DC
Start: 2023-11-01 — End: 2024-05-02

## 2023-11-01 NOTE — Telephone Encounter (Signed)
Updated prescription sent to patient's pharmacy  For hydralazine 10mg  three times daily, 270 tabs with 1 refill.   Ronnald Ramp, MD

## 2023-11-01 NOTE — Telephone Encounter (Signed)
Copied from CRM [id number]. Topic: General - Other >> Nov 01, 2023 10:42 AM Franchot Heidelberg wrote: Reason for CRM: Pt called reporting that her PCP increased her prescription from 2 pills a day to 3 pills a day. She says that [facility] spoke to her today and they told her that they do not have her prescription. Please advise  hydrALAZINE (APRESOLINE) 10 MG tablet [facility] in [place]

## 2023-11-02 NOTE — Telephone Encounter (Signed)
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 She feels this medication is helping her very well.

## 2023-12-07 ENCOUNTER — Other Ambulatory Visit: Payer: Self-pay | Admitting: General Surgery

## 2023-12-07 DIAGNOSIS — Z1231 Encounter for screening mammogram for malignant neoplasm of breast: Secondary | ICD-10-CM

## 2024-01-10 DIAGNOSIS — L538 Other specified erythematous conditions: Secondary | ICD-10-CM | POA: Diagnosis not present

## 2024-01-10 DIAGNOSIS — Z85828 Personal history of other malignant neoplasm of skin: Secondary | ICD-10-CM | POA: Diagnosis not present

## 2024-01-10 DIAGNOSIS — L900 Lichen sclerosus et atrophicus: Secondary | ICD-10-CM | POA: Diagnosis not present

## 2024-01-10 DIAGNOSIS — R58 Hemorrhage, not elsewhere classified: Secondary | ICD-10-CM | POA: Diagnosis not present

## 2024-01-10 DIAGNOSIS — L821 Other seborrheic keratosis: Secondary | ICD-10-CM | POA: Diagnosis not present

## 2024-01-10 DIAGNOSIS — D2261 Melanocytic nevi of right upper limb, including shoulder: Secondary | ICD-10-CM | POA: Diagnosis not present

## 2024-01-10 DIAGNOSIS — L820 Inflamed seborrheic keratosis: Secondary | ICD-10-CM | POA: Diagnosis not present

## 2024-01-10 DIAGNOSIS — D2262 Melanocytic nevi of left upper limb, including shoulder: Secondary | ICD-10-CM | POA: Diagnosis not present

## 2024-01-10 DIAGNOSIS — Z85820 Personal history of malignant melanoma of skin: Secondary | ICD-10-CM | POA: Diagnosis not present

## 2024-01-10 DIAGNOSIS — D225 Melanocytic nevi of trunk: Secondary | ICD-10-CM | POA: Diagnosis not present

## 2024-01-10 DIAGNOSIS — Z08 Encounter for follow-up examination after completed treatment for malignant neoplasm: Secondary | ICD-10-CM | POA: Diagnosis not present

## 2024-02-05 ENCOUNTER — Ambulatory Visit
Admission: RE | Admit: 2024-02-05 | Discharge: 2024-02-05 | Disposition: A | Discharge status: Home / Self Care | Payer: PPO | Source: Ambulatory Visit | Attending: General Surgery | Admitting: General Surgery

## 2024-02-05 DIAGNOSIS — Z1231 Encounter for screening mammogram for malignant neoplasm of breast: Secondary | ICD-10-CM | POA: Diagnosis not present

## 2024-02-06 ENCOUNTER — Ambulatory Visit: Payer: PPO | Admitting: Family Medicine

## 2024-02-07 ENCOUNTER — Ambulatory Visit: Payer: PPO | Admitting: Family Medicine

## 2024-02-13 DIAGNOSIS — Z171 Estrogen receptor negative status [ER-]: Secondary | ICD-10-CM | POA: Diagnosis not present

## 2024-02-13 DIAGNOSIS — C50211 Malignant neoplasm of upper-inner quadrant of right female breast: Secondary | ICD-10-CM | POA: Diagnosis not present

## 2024-02-15 DIAGNOSIS — H40153 Residual stage of open-angle glaucoma, bilateral: Secondary | ICD-10-CM | POA: Diagnosis not present

## 2024-02-19 ENCOUNTER — Ambulatory Visit: Payer: PPO | Admitting: Family Medicine

## 2024-02-19 ENCOUNTER — Encounter: Payer: Self-pay | Admitting: Family Medicine

## 2024-02-19 VITALS — BP 146/63 | HR 82 | Ht 62.0 in | Wt 182.0 lb

## 2024-02-19 DIAGNOSIS — G629 Polyneuropathy, unspecified: Secondary | ICD-10-CM

## 2024-02-19 DIAGNOSIS — I10 Essential (primary) hypertension: Secondary | ICD-10-CM

## 2024-02-19 MED ORDER — DULOXETINE HCL 30 MG PO CPEP
ORAL_CAPSULE | ORAL | 1 refills | Status: DC
Start: 2024-02-19 — End: 2024-06-10

## 2024-02-19 NOTE — Patient Instructions (Signed)
 VISIT SUMMARY:  Today, we discussed your blood pressure management and neuropathy symptoms. We reviewed your current medications and made some adjustments to help better manage your conditions. We also talked about general health maintenance and the importance of regular follow-up visits.  YOUR PLAN:  -HYPERTENSION: Hypertension, or high blood pressure, can increase the risk of stroke, especially if you live alone. Your recent home readings have improved but still need monitoring. Continue taking losartan 50 mg, hydralazine 10 mg three times a day (one in the morning and two at night), and amlodipine 5 mg. Regularly monitor your blood pressure and report if it consistently goes above 150/90. Keeping your blood pressure under 150/90 is crucial to reduce stroke risk.  -PERIPHERAL NEUROPATHY: Peripheral neuropathy is a condition that causes burning pain in your feet, especially at night. You have been managing it with gabapentin 600 mg daily, Epsom salt soaks, and topical numbing ointments. We are adding Cymbalta, starting at 30 mg daily and increasing to 60 mg after 1 week   You may also consider using magnesium lotion for additional relief.  -GENERAL HEALTH MAINTENANCE: You are generally in good health with no significant complaints other than hypertension and peripheral neuropathy. It's important to maintain regular physical activity and a balanced diet. Regular follow-up visits are essential to monitor your chronic conditions.  INSTRUCTIONS:  Please schedule a follow-up appointment in 6 weeks to reassess your blood pressure and neuropathic pain management. Continue to monitor your blood pressure regularly and report any consistent readings above 150/90.

## 2024-02-19 NOTE — Progress Notes (Signed)
 Established patient visit   Patient: Kelly Clark   DOB: 07/26/1929   89 y.o. Female  MRN: [id number] Visit Date: 02/19/2024  Today's healthcare provider: Ronnald Ramp, MD   Chief Complaint  Patient presents with   Hypertension    Bp recheck, she has home reading, still reading High   Subjective     HPI     Hypertension    Additional comments: Bp recheck, she has home reading, still reading High      Last edited by Thedora Hinders, CMA on 02/19/2024  1:46 PM.       Discussed the use of AI scribe software for clinical note transcription with the patient, who gave verbal consent to proceed.  History of Present Illness   Kelly Clark is a 89 year old female with hypertension and neuropathy who presents for blood pressure management and neuropathy symptoms.  She has been experiencing elevated blood pressure readings at home, with occasional spikes reaching 160 mmHg. This is concerning to her, especially since she lives alone and her family is distant. She tries to measure her blood pressure during periods of rest to avoid falsely elevated readings. Her current medications include losartan 50 mg, hydralazine 10 mg three times a day, and amlodipine 5 mg. She adjusts her hydralazine intake to one in the morning and two at night to minimize nocturia, allowing her to sleep 8-9 hours per night.  She has a long-standing history of neuropathy in her feet for over 20 years, causing burning sensations, particularly at night. She manages these symptoms by soaking her feet in Epsom salts and applying a numbing ointment, which helps her sleep. She avoids frequent use of Advil but takes it when necessary. Her symptoms can be exacerbated by certain foods, especially when dining out, though these episodes are not daily. She is currently on gabapentin 600 mg once a day for neuropathy.  Socially, she lives alone with her family residing 3000 miles away. She has a daughter who  visits periodically, and she enjoys these visits despite the daughter's high energy levels. She owns nearly 100 acres of land, which she is considering selling to the government with conditions to prevent commercial development.         Past Medical History:  Diagnosis Date   Basal cell carcinoma 2019   RIGHT THUMB REGION   Breast cancer (HCC) 06/27/2018   RIGHT   Cancer (HCC) 1977   melanoma . SURGERY RIGHT ARM   Hemorrhoids    Hypertension    Hypertriglyceridemia    Insomnia    Menopausal disorder    Neutropenia (HCC)    Paresthesia    Peripheral neuropathy    Tinea corporis    Vitamin D deficiency     Medications: Outpatient Medications Prior to Visit  Medication Sig   amLODipine (NORVASC) 5 MG tablet Take 1 tablet (5 mg total) by mouth daily.   Cholecalciferol (VITAMIN D3) 10000 units TABS Take 10,000 Units by mouth at bedtime.   clobetasol ointment (TEMOVATE) 0.05 % Apply 1 application topically once a week. Applied to affected area of vagina and rectum   gabapentin (NEURONTIN) 600 MG tablet Take 1 tablet (600 mg total) by mouth daily.   hydrALAZINE (APRESOLINE) 10 MG tablet Take 1 tablet (10 mg total) by mouth 3 (three) times daily.   latanoprost (XALATAN) 0.005 % ophthalmic solution Place 1 drop into both eyes at bedtime.    losartan (COZAAR) 50 MG tablet TAKE 1  TABLET(50 MG) BY MOUTH DAILY   Multiple Vitamin (MULTIVITAMIN) tablet Take 1 tablet by mouth daily.   multivitamin-lutein (OCUVITE-LUTEIN) CAPS capsule Take 1 capsule by mouth every morning.    polyethylene glycol powder (GLYCOLAX/MIRALAX) 17 GM/SCOOP powder Take 17 g by mouth at bedtime.    RESVERATROL-GRAPE PO Take 1 capsule by mouth daily. CoQ10 Resveratrol & Grape Seed   Turmeric (QC TUMERIC COMPLEX) 500 MG CAPS Take by mouth.   No facility-administered medications prior to visit.    Review of Systems      Objective    BP (!) 146/63   Pulse 82   Ht 5\' 2"  (1.575 m)   Wt 182 lb (82.6 kg)   SpO2  97%   BMI 33.29 kg/m  BP Readings from Last 3 Encounters:  02/19/24 (!) 146/63  10/06/23 (!) 157/80  09/22/23 (!) 150/67   Wt Readings from Last 3 Encounters:  02/19/24 182 lb (82.6 kg)  10/18/23 182 lb (82.6 kg)  10/06/23 182 lb 6.4 oz (82.7 kg)        Physical Exam  General: Alert, no acute distress Cardio: Normal S1 and S2, RRR, no r/m/g Pulm: CTAB, normal work of breathing MSK: no LE edema   No results found for any visits on 02/19/24.  Assessment & Plan     Problem List Items Addressed This Visit   None Visit Diagnoses       Neuropathy    -  Primary   Relevant Medications   DULoxetine (CYMBALTA) 30 MG capsule           Hypertension Hypertension with recent home readings improving from 160s to below 150. She is concerned about stroke risk due to living alone. Current medications: losartan 50 mg, hydralazine 10 mg TID (1 in the morning, 2 at night), and amlodipine 5 mg. Discussed balancing blood pressure control to avoid stroke risk. Emphasized maintaining BP under 150/90. - Continue losartan 50 mg, hydralazine 10 mg TID (1 in the morning, 2 at night), and amlodipine 5 mg - Monitor BP regularly and report if consistently >150/90 - Educate on maintaining BP under 150/90 to reduce stroke risk  Peripheral Neuropathy Chronic peripheral neuropathy in the feet for over 20 years, causing burning pain, especially at night. Current management: gabapentin 600 mg daily, Epsom salt soaks, and topical numbing ointments. Discussed adding Cymbalta, starting at 30 mg and increasing to 60 mg if tolerated, aiming to improve pain by at least 10%. - Start Cymbalta 30 mg daily, increase to 60 mg if tolerated - Provide educational materials on Cymbalta - Reassess pain in 6 weeks - Consider magnesium lotion for additional relief if needed  General Health Maintenance Generally in good health with no significant complaints other than hypertension and peripheral neuropathy. Reports  good energy levels. - Encourage regular physical activity and a balanced diet - Discuss the importance of regular follow-up visits to monitor chronic conditions  Follow-up - Schedule follow-up in 6 weeks to reassess BP and neuropathic pain management.         Return if symptoms worsen or fail to improve.         Ronnald Ramp, MD  [facility] [phone] (phone) [phone] (fax)  [facility]

## 2024-03-21 ENCOUNTER — Other Ambulatory Visit: Payer: Self-pay | Admitting: Family Medicine

## 2024-04-30 ENCOUNTER — Other Ambulatory Visit: Payer: Self-pay | Admitting: Family Medicine

## 2024-04-30 DIAGNOSIS — G629 Polyneuropathy, unspecified: Secondary | ICD-10-CM

## 2024-05-02 ENCOUNTER — Other Ambulatory Visit: Payer: Self-pay | Admitting: Family Medicine

## 2024-05-02 DIAGNOSIS — I10 Essential (primary) hypertension: Secondary | ICD-10-CM

## 2024-05-17 ENCOUNTER — Other Ambulatory Visit: Payer: Self-pay | Admitting: Family Medicine

## 2024-05-28 ENCOUNTER — Other Ambulatory Visit: Payer: Self-pay | Admitting: Family Medicine

## 2024-05-28 DIAGNOSIS — I10 Essential (primary) hypertension: Secondary | ICD-10-CM

## 2024-05-28 NOTE — Telephone Encounter (Signed)
 Patient needs OV  scheduled , last OV 10/06/23. Please call patient

## 2024-05-29 NOTE — Telephone Encounter (Signed)
 Requested medication (s) are due for refill today: yes  Requested medication (s) are on the active medication list: yes  Last refill:  10/26/23 #90 1 RF  Future visit scheduled: no  Notes to clinic:  overdue potassium level   Requested Prescriptions  Pending Prescriptions Disp Refills   losartan  (COZAAR ) 50 MG tablet [Pharmacy Med Name: LOSARTAN  50MG  TABLETS] 90 tablet 1    Sig: TAKE 1 TABLET(50 MG) BY MOUTH DAILY     Cardiovascular:  Angiotensin Receptor Blockers Failed - 05/29/2024  8:05 AM      Failed - Cr in normal range and within 180 days    Creat  Date Value Ref Range Status  10/10/2017 0.94 (H) 0.60 - 0.88 mg/dL Final    Comment:    For patients >41 years of age, the reference limit for Creatinine is approximately 13% higher for people identified as African-American. .    Creatinine, Ser  Date Value Ref Range Status  02/28/2023 0.99 0.57 - 1.00 mg/dL Final         Failed - K in normal range and within 180 days    Potassium  Date Value Ref Range Status  02/28/2023 4.0 3.5 - 5.2 mmol/L Final         Failed - Last BP in normal range    BP Readings from Last 1 Encounters:  02/19/24 (!) 146/63         Passed - Patient is not pregnant      Passed - Valid encounter within last 6 months    Recent Outpatient Visits           3 months ago Neuropathy   [place] [facility] [place], Judyann Number, MD

## 2024-06-06 ENCOUNTER — Other Ambulatory Visit: Payer: Self-pay | Admitting: Family Medicine

## 2024-06-06 DIAGNOSIS — G629 Polyneuropathy, unspecified: Secondary | ICD-10-CM

## 2024-06-07 NOTE — Telephone Encounter (Signed)
 Requested medications are due for refill today.  unsure  Requested medications are on the active medications list.  yes  Last refill. 02/19/2024 #60 1 rf  Future visit scheduled.   yes  Notes to clinic.  Rx expired 04/01/2024    Requested Prescriptions  Pending Prescriptions Disp Refills   DULoxetine  (CYMBALTA ) 30 MG capsule [Pharmacy Med Name: DULOXETINE  DR 30MG  CAPSULES] 60 capsule 1    Sig: TAKE 1 CAPSULE(30 MG) BY MOUTH DAILY FOR 14 DAYS THEN TAKE 2 CAPSULES(60 MG) BY MOUTH DAILY FOR 28 DAYS     Psychiatry: Antidepressants - SNRI - duloxetine  Failed - 06/07/2024  5:26 PM      Failed - Cr in normal range and within 360 days    Creat  Date Value Ref Range Status  10/10/2017 0.94 (H) 0.60 - 0.88 mg/dL Final    Comment:    For patients >52 years of age, the reference limit for Creatinine is approximately 13% higher for people identified as African-American. .    Creatinine, Ser  Date Value Ref Range Status  02/28/2023 0.99 0.57 - 1.00 mg/dL Final         Failed - eGFR is 30 or above and within 360 days    GFR, Est African American  Date Value Ref Range Status  10/10/2017 64 > OR = 60 mL/min/1.74m2 Final   GFR calc Af Amer  Date Value Ref Range Status  09/29/2020 59 (L) >59 mL/min/1.73 Final    Comment:    **[facility] currently reports eGFR in compliance with the current**   recommendations of the [employer]. [facility] will   update reporting as new guidelines are published from the NKF-ASN   Task force.    GFR, Est Non African American  Date Value Ref Range Status  10/10/2017 55 (L) > OR = 60 mL/min/1.44m2 Final   GFR calc non Af Amer  Date Value Ref Range Status  09/29/2020 51 (L) >59 mL/min/1.73 Final   eGFR  Date Value Ref Range Status  02/28/2023 54 (L) >59 mL/min/1.73 Final         Failed - Last BP in normal range    BP Readings from Last 1 Encounters:  02/19/24 (!) 146/63         Passed - Completed PHQ-2 or PHQ-9 in the last 360 days       Passed - Valid encounter within last 6 months    Recent Outpatient Visits           3 months ago Neuropathy   [facility] [place], Judyann Number, MD

## 2024-06-10 ENCOUNTER — Telehealth: Payer: Self-pay | Admitting: Family Medicine

## 2024-06-10 NOTE — Telephone Encounter (Signed)
 Meds already refilled

## 2024-06-10 NOTE — Telephone Encounter (Signed)
 [facility] pharmacy is requesting refill DULoxetine  (CYMBALTA ) 30 MG capsule  Please advise

## 2024-06-25 DIAGNOSIS — H40153 Residual stage of open-angle glaucoma, bilateral: Secondary | ICD-10-CM | POA: Diagnosis not present

## 2024-08-14 ENCOUNTER — Other Ambulatory Visit: Payer: Self-pay | Admitting: Family Medicine

## 2024-08-22 ENCOUNTER — Other Ambulatory Visit: Payer: Self-pay | Admitting: Family Medicine

## 2024-08-22 DIAGNOSIS — G629 Polyneuropathy, unspecified: Secondary | ICD-10-CM

## 2024-09-10 DIAGNOSIS — H903 Sensorineural hearing loss, bilateral: Secondary | ICD-10-CM | POA: Diagnosis not present

## 2024-09-10 DIAGNOSIS — H6123 Impacted cerumen, bilateral: Secondary | ICD-10-CM | POA: Diagnosis not present

## 2024-10-23 ENCOUNTER — Ambulatory Visit (INDEPENDENT_AMBULATORY_CARE_PROVIDER_SITE_OTHER): Payer: PPO | Admitting: Emergency Medicine

## 2024-10-23 VITALS — Ht 62.0 in | Wt 176.0 lb

## 2024-10-23 DIAGNOSIS — Z1231 Encounter for screening mammogram for malignant neoplasm of breast: Secondary | ICD-10-CM

## 2024-10-23 DIAGNOSIS — Z0000 Encounter for general adult medical examination without abnormal findings: Secondary | ICD-10-CM | POA: Diagnosis not present

## 2024-10-23 NOTE — Patient Instructions (Addendum)
 Kelly Clark,  Thank you for taking the time for your Medicare Wellness Visit. I appreciate your continued commitment to your health goals. Please review the care plan we discussed, and feel free to reach out if I can assist you further.  Please note that Annual Wellness Visits do not include a physical exam. Some assessments may be limited, especially if the visit was conducted virtually. If needed, we may recommend an in-person follow-up with your provider.  Ongoing Care Seeing your primary care provider every 3 to 6 months helps us  monitor your health and provide consistent, personalized care. I have made you an appointment with Dr. Sharma for 01/01/25 @ 1:40pm.  Referrals If a referral was made during today's visit and you haven't received any updates within two weeks, please contact the referred provider directly to check on the status.  Recommended Screenings:  Please call to schedule your mammogram (due after 02/04/25):  [facility] Address: [address], [place], [place] Phone: [phone]   Health Maintenance  Topic Date Due   Zoster (Shingles) Vaccine (1 of 2) Never done   COVID-19 Vaccine (3 - Moderna risk series) 02/25/2020   Medicare Annual Wellness Visit  10/17/2024   Breast Cancer Screening  02/04/2025   DTaP/Tdap/Td vaccine (3 - Td or Tdap) 01/29/2032   Pneumococcal Vaccine for age over 54  Completed   Flu Shot  Completed   DEXA scan (bone density measurement)  Completed   Meningitis B Vaccine  Aged Out       10/23/2024   11:45 AM  Advanced Directives  Does Patient Have a Medical Advance Directive? Yes  Type of Estate Agent of [place];Living will  Does patient want to make changes to medical advance directive? No - Patient declined  Copy of Healthcare Power of Attorney in Chart? No - copy requested    Vision: Annual vision screenings are recommended for early detection of glaucoma,  cataracts, and diabetic retinopathy. These exams can also reveal signs of chronic conditions such as diabetes and high blood pressure.  Dental: Annual dental screenings help detect early signs of oral cancer, gum disease, and other conditions linked to overall health, including heart disease and diabetes.  Please see the attached documents for additional preventive care recommendations.    Fall Prevention in the Home, Adult Falls can cause injuries and affect people of all ages. There are many simple things that you can do to make your home safe and to help prevent falls. If you need it, ask for help making these changes. What actions can I take to prevent falls? General information Use good lighting in all rooms. Make sure to: Replace any light bulbs that burn out. Turn on lights if it is dark and use night-lights. Keep items that you use often in easy-to-reach places. Lower the shelves around your home if needed. Move furniture so that there are clear paths around it. Do not keep throw rugs or other things on the floor that can make you trip. If any of your floors are uneven, fix them. Add color or contrast paint or tape to clearly mark and help you see: Grab bars or handrails. First and last steps of staircases. Where the edge of each step is. If you use a ladder or stepladder: Make sure that it is fully opened. Do not climb a closed ladder. Make sure the sides of the ladder are locked in place. Have someone hold the ladder while you use  it. Know where your pets are as you move through your home. What can I do in the bathroom?     Keep the floor dry. Clean up any water that is on the floor right away. Remove soap buildup in the bathtub or shower. Buildup makes bathtubs and showers slippery. Use non-skid mats or decals on the floor of the bathtub or shower. Attach bath mats securely with double-sided, non-slip rug tape. If you need to sit down while you are in the shower, use a  non-slip stool. Install grab bars by the toilet and in the bathtub and shower. Do not use towel bars as grab bars. What can I do in the bedroom? Make sure that you have a light by your bed that is easy to reach. Do not use any sheets or blankets on your bed that hang to the floor. Have a firm bench or chair with side arms that you can use for support when you get dressed. What can I do in the kitchen? Clean up any spills right away. If you need to reach something above you, use a sturdy step stool that has a grab bar. Keep electrical cables out of the way. Do not use floor polish or wax that makes floors slippery. What can I do with my stairs? Do not leave anything on the stairs. Make sure that you have a light switch at the top and the bottom of the stairs. Have them installed if you do not have them. Make sure that there are handrails on both sides of the stairs. Fix handrails that are broken or loose. Make sure that handrails are as long as the staircases. Install non-slip stair treads on all stairs in your home if they do not have carpet. Avoid having throw rugs at the top or bottom of stairs, or secure the rugs with carpet tape to prevent them from moving. Choose a carpet design that does not hide the edge of steps on the stairs. Make sure that carpet is firmly attached to the stairs. Fix any carpet that is loose or worn. What can I do on the outside of my home? Use bright outdoor lighting. Repair the edges of walkways and driveways and fix any cracks. Clear paths of anything that can make you trip, such as tools or rocks. Add color or contrast paint or tape to clearly mark and help you see high doorway thresholds. Trim any bushes or trees on the main path into your home. Check that handrails are securely fastened and in good repair. Both sides of all steps should have handrails. Install guardrails along the edges of any raised decks or porches. Have leaves, snow, and ice cleared  regularly. Use sand, salt, or ice melt on walkways during winter months if you live where there is ice and snow. In the garage, clean up any spills right away, including grease or oil spills. What other actions can I take? Review your medicines with your health care provider. Some medicines can make you confused or feel dizzy. This can increase your chance of falling. Wear closed-toe shoes that fit well and support your feet. Wear shoes that have rubber soles and low heels. Use a cane, walker, scooter, or crutches that help you move around if needed. Talk with your provider about other ways that you can decrease your risk of falls. This may include seeing a physical therapist to learn to do exercises to improve movement and strength. Where to find more information Centers for Disease Control  and Prevention, [employer]: tonerpromos.no [employer] on Aging: baseringtones.pl National Institute on Aging: baseringtones.pl Contact a health care provider if: You are afraid of falling at home. You feel weak, drowsy, or dizzy at home. You fall at home. Get help right away if you: Lose consciousness or have trouble moving after a fall. Have a fall that causes a head injury. These symptoms may be an emergency. Get help right away. Call 911. Do not wait to see if the symptoms will go away. Do not drive yourself to the hospital. This information is not intended to replace advice given to you by your health care provider. Make sure you discuss any questions you have with your health care provider. Document Revised: 08/08/2022 Document Reviewed: 08/08/2022 Elsevier Patient Education  2024 [employer].

## 2024-10-23 NOTE — Progress Notes (Addendum)
 Subjective:  Visit Complete: Virtual I connected with this patient by a audio enabled telemedicine application and verified that I am speaking with the correct person using two identifiers.  Patient Location: Home Provider Location: Home Office  I discussed the limitations of evaluation and management by telemedicine. The patient expressed understanding and agreed to proceed.  Persons Participating in Visit: Patient    Kelly Clark is a 89 y.o. female who presents for a Medicare Annual Wellness Visit.  Allergies (verified) Patient has no known allergies.   History: Past Medical History:  Diagnosis Date   Basal cell carcinoma 2019   RIGHT THUMB REGION   Breast cancer (HCC) 06/27/2018   RIGHT   Cancer (HCC) 1977   melanoma . SURGERY RIGHT ARM   Hemorrhoids    Hypertension    Hypertriglyceridemia    Insomnia    Menopausal disorder    Neutropenia    Paresthesia    Peripheral neuropathy    Tinea corporis    Vitamin D deficiency    Past Surgical History:  Procedure Laterality Date   ABDOMINAL HYSTERECTOMY  1977   BREAST BIOPSY Bilateral 1977   benign   BREAST BIOPSY Right 06/27/2018   INVASIVE MAMMARY CARCINOMA, NO SPECIAL TYPE. 2 oclock ER/PR negative HER2 positive   BREAST EXCISIONAL BIOPSY Right 07/23/2018   lumpectomy   BREAST LUMPECTOMY Right 2019   [facility]   BREAST LUMPECTOMY WITH SENTINEL LYMPH NODE BIOPSY Right 07/23/2018   6 mm ER/PR negative, Her 2 neu 3+; node negative.  Surgeon: Dessa Reyes ORN, MD;  Location: [facility] ORS;  Service: General;  Laterality: Right;   COLONOSCOPY  2007   ELECTROCARDIOGRAM     EYE SURGERY Bilateral    CATARACT EXTRACTIONS   Family History  Problem Relation Age of Onset   Heart attack Mother    Congestive Heart Failure Mother    Stroke Father    Colon cancer Sister 17   Throat cancer Maternal Grandmother 2   Healthy Daughter    Breast cancer Neg Hx    Social History   Occupational History   Occupation: Retired   Tobacco Use   Smoking status: Never   Smokeless tobacco: Never  Vaping Use   Vaping status: Never Used  Substance and Sexual Activity   Alcohol use: Never   Drug use: Never   Sexual activity: Not Currently    Comment: Had hysterectomy in 1977   Tobacco Counseling Counseling given: Not Answered  SDOH Screenings   Food Insecurity: No Food Insecurity (10/23/2024)  Housing: Unknown (10/23/2024)  Transportation Needs: No Transportation Needs (10/23/2024)  Utilities: Not At Risk (10/23/2024)  Alcohol Screen: Low Risk  (10/17/2023)  Depression (PHQ2-9): Low Risk  (10/23/2024)  Financial Resource Strain: Low Risk  (10/22/2024)  Physical Activity: Sufficiently Active (10/23/2024)  Social Connections: Moderately Integrated (10/23/2024)  Recent Concern: Social Connections - Moderately Isolated (10/22/2024)  Stress: No Stress Concern Present (10/23/2024)  Tobacco Use: Low Risk  (10/23/2024)  Health Literacy: Adequate Health Literacy (10/23/2024)   Depression Screen    10/23/2024   11:56 AM 02/19/2024    1:48 PM 10/18/2023   12:44 PM 10/06/2023   11:05 AM 09/06/2023    1:25 PM 04/13/2023    1:20 PM 02/08/2023   10:12 AM  PHQ 2/9 Scores  PHQ - 2 Score 0 0 0 0 0 0 0  PHQ- 9 Score 0 0    0 0     Goals Addressed  This Visit's Progress     Maintain current health (pt-stated)         Visit info / Clinical Intake: Medicare Wellness Visit Type:: Subsequent Annual Wellness Visit Medicare Wellness Visit Mode:: Telephone If telephone:: video declined If telephone or video:: pt reported vitals Interpreter Needed?: No Pre-visit prep was completed: yes AWV questionnaire completed by patient prior to visit?: yes Date:: 10/22/24 Living arrangements:: (!) lives alone Patient's Overall Health Status Rating: excellent Typical amount of pain: none Does pain affect daily life?: no Are you currently prescribed opioids?: no  Dietary Habits and Nutritional Risks How many meals a day?:  3 Eats fruit and vegetables daily?: yes Most meals are obtained by: preparing own meals In the last 2 weeks, have you had any of the following?: -- (no) Diabetic:: no  Functional Status Activities of Daily Living (to include ambulation/medication): Independent Ambulation: Independent with device- listed below Home Assistive Devices/Equipment: Eyeglasses; Other (Comment) (hearing aids) Medication Administration: Independent Home Management: Independent Manage your own finances?: yes Primary transportation is: driving Concerns about vision?: no *vision screening is required for WTM* Concerns about hearing?: no (wears hearing aids)  Fall Screening Falls in the past year?: 0 Number of falls in past year: 0 Was there an injury with Fall?: 0 Fall Risk Category Calculator: 0 Patient Fall Risk Level: Low Fall Risk  Fall Risk Patient at Risk for Falls Due to: No Fall Risks Fall risk Follow up: Falls evaluation completed  Home and Transportation Safety: All rugs have non-skid backing?: yes All stairs or steps have railings?: yes Grab bars in the bathtub or shower?: (!) no (keeps a walker in her shower) Have non-skid surface in bathtub or shower?: yes Good home lighting?: yes Regular seat belt use?: yes Hospital stays in the last year:: no  Cognitive Assessment Difficulty concentrating, remembering, or making decisions? : no Will 6CIT or Mini Cog be Completed: yes What year is it?: 0 points What month is it?: 0 points Give patient an address phrase to remember (5 components): [address] [place] About what time is it?: 0 points Count backwards from 20 to 1: 0 points Say the months of the year in reverse: 0 points Repeat the address phrase from earlier: 0 points 6 CIT Score: 0 points  Advance Directives (For Healthcare) Does Patient Have a Medical Advance Directive?: Yes Does patient want to make changes to medical advance directive?: No - Patient declined Type of Advance  Directive: Healthcare Power of [place]; Living will Copy of Healthcare Power of Attorney in Chart?: No - copy requested Copy of Living Will in Chart?: No - copy requested  Reviewed/Updated  Reviewed/Updated: All        Objective:    Today's Vitals   10/23/24 1139  Weight: 176 lb (79.8 kg)  Height: 5' 2 (1.575 m)   Body mass index is 32.19 kg/m.  Current Medications (verified) Outpatient Encounter Medications as of 10/23/2024  Medication Sig   amLODipine  (NORVASC ) 5 MG tablet TAKE 1 TABLET(5 MG) BY MOUTH DAILY   Cholecalciferol (VITAMIN D3) 10000 units TABS Take 10,000 Units by mouth at bedtime.   clobetasol ointment (TEMOVATE) 0.05 % Apply 1 application topically once a week. Applied to affected area of vagina and rectum   DULoxetine  (CYMBALTA ) 60 MG capsule Take 1 capsule (60 mg total) by mouth daily.   gabapentin  (NEURONTIN ) 600 MG tablet TAKE 1 TABLET(600 MG) BY MOUTH DAILY   hydrALAZINE  (APRESOLINE ) 10 MG tablet TAKE 1 TABLET(10 MG) BY MOUTH THREE  TIMES DAILY   latanoprost (XALATAN) 0.005 % ophthalmic solution Place 1 drop into both eyes at bedtime.    losartan  (COZAAR ) 50 MG tablet TAKE 1 TABLET(50 MG) BY MOUTH DAILY   Multiple Vitamin (MULTIVITAMIN) tablet Take 1 tablet by mouth daily.   multivitamin-lutein (OCUVITE-LUTEIN) CAPS capsule Take 1 capsule by mouth every morning.    polyethylene glycol powder (GLYCOLAX/MIRALAX) 17 GM/SCOOP powder Take 17 g by mouth at bedtime.    RESVERATROL-GRAPE PO Take 1 capsule by mouth daily. CoQ10 Resveratrol & Grape Seed (Patient not taking: Reported on 10/23/2024)   Turmeric (QC TUMERIC COMPLEX) 500 MG CAPS Take by mouth. (Patient not taking: Reported on 10/23/2024)   No facility-administered encounter medications on file as of 10/23/2024.   Hearing/Vision screen Hearing Screening - Comments:: Wears hearing aids Vision Screening - Comments:: UTD @ Dr. Manus Edison Immunizations and Health Maintenance Health Maintenance  Topic Date  Due   Zoster Vaccines- Shingrix (1 of 2) Never done   COVID-19 Vaccine (3 - Moderna risk series) 02/25/2020   Mammogram  02/04/2025   Medicare Annual Wellness (AWV)  10/23/2025   DTaP/Tdap/Td (3 - Td or Tdap) 01/29/2032   Pneumococcal Vaccine: 50+ Years  Completed   Influenza Vaccine  Completed   DEXA SCAN  Completed   Meningococcal B Vaccine  Aged Out        Assessment/Plan:  This is a routine wellness examination for Gardi.  Patient Care Team: Sharma Coyer, MD as PCP - General (Family Medicine) Edison Manus DASEN., MD as Consulting Physician (Ophthalmology) Isenstein, Arin L, MD as Consulting Physician (Dermatology)  I have personally reviewed and noted the following in the patient's chart:   Medical and social history Use of alcohol, tobacco or illicit drugs  Current medications and supplements including opioid prescriptions. Functional ability and status Nutritional status Physical activity Advanced directives List of other physicians Hospitalizations, surgeries, and ER visits in previous 12 months Vitals Screenings to include cognitive, depression, and falls Referrals and appointments  No orders of the defined types were placed in this encounter.  In addition, I have reviewed and discussed with patient certain preventive protocols, quality metrics, and best practice recommendations. A written personalized care plan for preventive services as well as general preventive health recommendations were provided to patient.   Vina Ned, CMA   10/23/2024   Return in 1 year (on 10/23/2025).  After Visit Summary: (Mail) Due to this being a telephonic visit, the after visit summary with patients personalized plan was offered to patient via mail   Nurse Notes:  Placed order for a MMG due ~ 02/04/25 Scheduled OV for 01/01/25. Last seen 02/19/24 was due for a 6 wk f/u for neuropathy and blood pressure. Received Covid vaccine 10/21/24 Declined Shingles  vaccine Screening colonoscopy or DEXA scan is no longer recommended due to age.

## 2024-10-24 ENCOUNTER — Other Ambulatory Visit: Payer: Self-pay | Admitting: Family Medicine

## 2024-10-29 DIAGNOSIS — H40153 Residual stage of open-angle glaucoma, bilateral: Secondary | ICD-10-CM | POA: Diagnosis not present

## 2024-11-01 ENCOUNTER — Other Ambulatory Visit: Payer: Self-pay | Admitting: Family Medicine

## 2024-11-01 DIAGNOSIS — I10 Essential (primary) hypertension: Secondary | ICD-10-CM

## 2024-11-30 ENCOUNTER — Other Ambulatory Visit: Payer: Self-pay | Admitting: Family Medicine

## 2024-11-30 DIAGNOSIS — I10 Essential (primary) hypertension: Secondary | ICD-10-CM

## 2025-01-01 ENCOUNTER — Encounter: Payer: Self-pay | Admitting: Family Medicine

## 2025-01-01 ENCOUNTER — Ambulatory Visit (INDEPENDENT_AMBULATORY_CARE_PROVIDER_SITE_OTHER): Admitting: Family Medicine

## 2025-01-01 VITALS — BP 140/58 | HR 78 | Temp 98.2°F | Ht 62.0 in | Wt 175.7 lb

## 2025-01-01 DIAGNOSIS — E7800 Pure hypercholesterolemia, unspecified: Secondary | ICD-10-CM | POA: Diagnosis not present

## 2025-01-01 DIAGNOSIS — G629 Polyneuropathy, unspecified: Secondary | ICD-10-CM | POA: Insufficient documentation

## 2025-01-01 DIAGNOSIS — R2689 Other abnormalities of gait and mobility: Secondary | ICD-10-CM | POA: Diagnosis not present

## 2025-01-01 DIAGNOSIS — I10 Essential (primary) hypertension: Secondary | ICD-10-CM | POA: Diagnosis not present

## 2025-01-01 DIAGNOSIS — N3941 Urge incontinence: Secondary | ICD-10-CM | POA: Insufficient documentation

## 2025-01-01 DIAGNOSIS — I471 Supraventricular tachycardia, unspecified: Secondary | ICD-10-CM

## 2025-01-01 MED ORDER — GABAPENTIN 600 MG PO TABS: 300.0000 mg | ORAL_TABLET | Freq: Every day | ORAL | Status: AC

## 2025-01-01 NOTE — Patient Instructions (Signed)
 To keep you healthy, please keep in mind the following health maintenance items that you are due for:   Health Maintenance Due  Topic Date Due   Zoster Vaccines- Shingrix (1 of 2) Never done   COVID-19 Vaccine (3 - Moderna risk series) 02/25/2020   Mammogram  02/04/2025     Best Wishes,   Dr. Lang

## 2025-01-01 NOTE — Progress Notes (Signed)
 "  Established Patient Office Visit  Patient ID: Kelly Clark, female    DOB: [birth year]  Age: 89 y.o. MRN: [id number] PCP: Sharma Coyer, MD  Chief Complaint  Patient presents with   Medical Management of Chronic Issues    Patient is present for follow up with PCP, doing well per patient     Subjective:     HPI  Discussed the use of AI scribe software for clinical note transcription with the patient, who gave verbal consent to proceed.  History of Present Illness Kelly Clark is a 89 year old female with chronic hypertension and paroxysmal SVT who presents for a follow-up visit.  She feels well overall and recently went on a cruise. She has chronic hypertension and takes losartan  50 mg daily, hydralazine  10 mg three times daily, and amlodipine  5 mg daily.  She has a history of neuropathy and takes gabapentin  600 mg daily and Cymbalta  60 mg daily. She reports significant improvement in her neuropathy symptoms, no longer experiencing pain in her feet, which she attributes to medication and daily exercises. She performs foot exercises for about 20 minutes before bed, contributing to her symptom relief.  She experiences bladder leaks, using about four panty liners in 24 hours, and notes a strong urine odor, which has resolved. No pain with urination. She reports a weight loss of five pounds over the past four months, attributed to exercise and possibly Cymbalta .  She describes her balance as excellent when moving forward but notes difficulty when moving backward and some episodes of imbalance when standing still. She has been to physical therapy twice and has exercises she can perform at home. Her daughter worries she might be overmedicated.  She has plenty of energy but occasionally feels very tired in the middle of the day, requiring a 30-minute nap to regain energy. She typically sleeps nine hours at night.     ROS    Objective:     BP (!) 140/58 (BP Location:  Left Arm, Patient Position: Sitting, Cuff Size: Normal)   Pulse 78   Temp 98.2 F (36.8 C) (Oral)   Ht 5' 2 (1.575 m)   Wt 175 lb 11.2 oz (79.7 kg)   SpO2 99%   BMI 32.14 kg/m   BP Readings from Last 3 Encounters:  01/01/25 (!) 140/58  02/19/24 (!) 146/63  10/06/23 (!) 157/80   Wt Readings from Last 3 Encounters:  01/01/25 175 lb 11.2 oz (79.7 kg)  10/23/24 176 lb (79.8 kg)  02/19/24 182 lb (82.6 kg)        Physical Exam VITALS: BP- 140/50 CHEST: Clear to auscultation bilaterally, no wheezes, rhonchi, or crackles. CARDIOVASCULAR: Normal heart rate and rhythm, S1 and S2 normal without murmurs. NEUROLOGICAL: Cranial nerves II-XII intact bilaterally. Pupils equal, round, and reactive to light. Positive Romberg sign. Heel to shin intact. Normal rapid finger movement bilaterally. No cogwheel rigidity. Stumbling, forward gait, and speed.   Results for orders placed or performed in visit on 01/01/25  Microscopic Examination  Result Value Ref Range   WBC, UA >30 (A) 0 - 5 /hpf   RBC, Urine None seen 0 - 2 /hpf   Epithelial Cells (non renal) >10 (A) 0 - 10 /hpf   Casts None seen None seen /lpf   Crystals Present (A) N/A   Crystal Type Calcium Oxalate N/A   Bacteria, UA Moderate (A) None seen/Few  CMP14+EGFR  Result Value Ref Range   Glucose 90 70 - 99 mg/dL  BUN 18 10 - 36 mg/dL   Creatinine, Ser 8.99 0.57 - 1.00 mg/dL   eGFR 53 (L) >40 fO/fpw/8.26   BUN/Creatinine Ratio 18 12 - 28   Sodium 144 134 - 144 mmol/L   Potassium 3.9 3.5 - 5.2 mmol/L   Chloride 104 96 - 106 mmol/L   CO2 26 20 - 29 mmol/L   Calcium 9.7 8.7 - 10.3 mg/dL   Total Protein 6.6 6.0 - 8.5 g/dL   Albumin 4.4 3.6 - 4.6 g/dL   Globulin, Total 2.2 1.5 - 4.5 g/dL   Bilirubin Total 0.3 0.0 - 1.2 mg/dL   Alkaline Phosphatase 55 48 - 129 IU/L   AST 24 0 - 40 IU/L   ALT 16 0 - 32 IU/L  HgB A1c  Result Value Ref Range   Hgb A1c MFr Bld 5.5 4.8 - 5.6 %   Est. average glucose Bld gHb Est-mCnc 111 mg/dL   CBC  Result Value Ref Range   WBC 6.5 3.4 - 10.8 x10E3/uL   RBC 4.31 3.77 - 5.28 x10E6/uL   Hemoglobin 13.9 11.1 - 15.9 g/dL   Hematocrit 58.1 65.9 - 46.6 %   MCV 97 79 - 97 fL   MCH 32.3 26.6 - 33.0 pg   MCHC 33.3 31.5 - 35.7 g/dL   RDW 86.5 88.2 - 84.5 %   Platelets 207 150 - 450 x10E3/uL  Lipid panel  Result Value Ref Range   Cholesterol, Total 154 100 - 199 mg/dL   Triglycerides 726 (H) 0 - 149 mg/dL   HDL 47 >60 mg/dL   VLDL Cholesterol Cal 43 (H) 5 - 40 mg/dL   LDL Chol Calc (NIH) 64 0 - 99 mg/dL   Chol/HDL Ratio 3.3 0.0 - 4.4 ratio  Urinalysis, Routine w reflex microscopic  Result Value Ref Range   Specific Gravity, UA 1.021 1.005 - 1.030   pH, UA 6.5 5.0 - 7.5   Color, UA Yellow Yellow   Appearance Ur Cloudy (A) Clear   Leukocytes,UA 2+ (A) Negative   Protein,UA Trace Negative/Trace   Glucose, UA Negative Negative   Ketones, UA Negative Negative   RBC, UA Negative Negative   Bilirubin, UA Negative Negative   Urobilinogen, Ur 1.0 0.2 - 1.0 mg/dL   Nitrite, UA Negative Negative   Microscopic Examination See below:   TSH + free T4  Result Value Ref Range   TSH 1.730 0.450 - 4.500 uIU/mL   Free T4 0.91 0.82 - 1.77 ng/dL  Folate  Result Value Ref Range   Folate >20.0 >3.0 ng/mL  Vitamin B12  Result Value Ref Range   Vitamin B-12 704 232 - 1,245 pg/mL    Last CBC Lab Results  Component Value Date   WBC 6.5 01/01/2025   HGB 13.9 01/01/2025   HCT 41.8 01/01/2025   MCV 97 01/01/2025   MCH 32.3 01/01/2025   RDW 13.4 01/01/2025   PLT 207 01/01/2025   Last metabolic panel Lab Results  Component Value Date   GLUCOSE 90 01/01/2025   NA 144 01/01/2025   K 3.9 01/01/2025   CL 104 01/01/2025   CO2 26 01/01/2025   BUN 18 01/01/2025   CREATININE 1.00 01/01/2025   EGFR 53 (L) 01/01/2025   CALCIUM 9.7 01/01/2025   PHOS 4.0 09/19/2018   PROT 6.6 01/01/2025   ALBUMIN 4.4 01/01/2025   LABGLOB 2.2 01/01/2025   AGRATIO 1.8 09/22/2021   BILITOT 0.3 01/01/2025    ALKPHOS 55 01/01/2025   AST 24  01/01/2025   ALT 16 01/01/2025   ANIONGAP 8 07/09/2018   Last lipids Lab Results  Component Value Date   CHOL 154 01/01/2025   HDL 47 01/01/2025   LDLCALC 64 01/01/2025   TRIG 273 (H) 01/01/2025   CHOLHDL 3.3 01/01/2025   Last hemoglobin A1c Lab Results  Component Value Date   HGBA1C 5.5 01/01/2025   Last thyroid  functions Lab Results  Component Value Date   TSH 1.730 01/01/2025   FREET4 0.91 01/01/2025   Last vitamin D No results found for: 25OHVITD2, 25OHVITD3, VD25OH Last vitamin B12 and Folate Lab Results  Component Value Date   VITAMINB12 704 01/01/2025   FOLATE >20.0 01/01/2025      The ASCVD Risk score (Arnett DK, et al., 2019) failed to calculate for the following reasons:   The 2019 ASCVD risk score is only valid for ages 39 to 14   * - Cholesterol units were assumed  Outpatient Encounter Medications as of 01/01/2025  Medication Sig   amLODipine  (NORVASC ) 5 MG tablet TAKE 1 TABLET(5 MG) BY MOUTH DAILY   Cholecalciferol (VITAMIN D3) 10000 units TABS Take 10,000 Units by mouth at bedtime.   clobetasol ointment (TEMOVATE) 0.05 % Apply 1 application topically once a week. Applied to affected area of vagina and rectum   DULoxetine  (CYMBALTA ) 60 MG capsule Take 1 capsule (60 mg total) by mouth daily.   hydrALAZINE  (APRESOLINE ) 10 MG tablet TAKE 1 TABLET(10 MG) BY MOUTH THREE TIMES DAILY   latanoprost (XALATAN) 0.005 % ophthalmic solution Place 1 drop into both eyes at bedtime.    losartan  (COZAAR ) 50 MG tablet TAKE 1 TABLET(50 MG) BY MOUTH DAILY   Multiple Vitamin (MULTIVITAMIN) tablet Take 1 tablet by mouth daily.   multivitamin-lutein (OCUVITE-LUTEIN) CAPS capsule Take 1 capsule by mouth every morning.    polyethylene glycol powder (GLYCOLAX/MIRALAX) 17 GM/SCOOP powder Take 17 g by mouth at bedtime.    [DISCONTINUED] gabapentin  (NEURONTIN ) 600 MG tablet TAKE 1 TABLET(600 MG) BY MOUTH DAILY   gabapentin  (NEURONTIN ) 600 MG  tablet Take 0.5 tablets (300 mg total) by mouth daily.   [DISCONTINUED] RESVERATROL-GRAPE PO Take 1 capsule by mouth daily. CoQ10 Resveratrol & Grape Seed (Patient not taking: Reported on 10/23/2024)   [DISCONTINUED] Turmeric (QC TUMERIC COMPLEX) 500 MG CAPS Take by mouth. (Patient not taking: Reported on 10/23/2024)   No facility-administered encounter medications on file as of 01/01/2025.       Assessment & Plan:   Problem List Items Addressed This Visit     Neuropathy   Relevant Medications   gabapentin  (NEURONTIN ) 600 MG tablet   Other Relevant Orders   HgB A1c (Completed)   CBC (Completed)   Paroxysmal SVT (supraventricular tachycardia)   Primary hypertension - Primary   Relevant Orders   CMP14+EGFR (Completed)   Urge incontinence of urine   Relevant Orders   Urinalysis, Routine w reflex microscopic (Completed)   Urine Culture   MR Brain Wo Contrast   Other Visit Diagnoses       Imbalance       Relevant Orders   TSH + free T4 (Completed)   Folate (Completed)   Vitamin B12 (Completed)   MR Brain Wo Contrast   Ambulatory referral to Neurology     Elevated LDL cholesterol level       Relevant Orders   Lipid panel (Completed)       Assessment and Plan Assessment & Plan Imbalance and gait abnormality Chronic  Intermittent imbalance and gait abnormality, particularly  when standing, with a forward gait and speed. Positive Romberg sign. No memory loss or confusion. Differential includes cerebellar dysfunction, normal pressure hydrocephalus, or vitamin deficiencies. - Ordered brain MRI to evaluate for cerebellar dysfunction or normal pressure hydrocephalus. - Referred to neurology for further evaluation and management. - Checked B12, thyroid , and folate levels to assess for vitamin deficiencies. - Advised use of a cane for stability to prevent falls.  Primary hypertension Chronic hypertension managed with losartan , hydralazine , and amlodipine . Blood pressure is  well-controlled at 140/50 mmHg. - Continue losartan  50 mg daily. - Continue hydralazine  10 mg three times daily. - Continue amlodipine  5 mg daily.    Polyneuropathy Chronic neuropathy previously managed with gabapentin  and Cymbalta . Reports significant improvement in neuropathic pain with current regimen and exercises. Gabapentin  may contribute to balance issues. - Reduced gabapentin  to 300 mg daily to assess impact on balance. - Continue Cymbalta  60 mg daily.  Urge incontinence Chronic  Reports urge incontinence with strong urine odor, but no pain. Declines additional medication for incontinence. Urine odor has resolved. - Ordered urinalysis and urine culture to rule out infection or other causes of odor. - pt does not want to start additional meds for OAB   General health maintenance Discussed weight loss and exercise regimen. Reports intentional weight loss over four months, possibly related to Cymbalta  and exercise. No new medications desired. - Continue current exercise regimen.    Return in about 3 months (around 04/01/2025) for incontinence, balance follow up.    Rockie Agent, MD [facility]  "

## 2025-01-02 LAB — CMP14+EGFR
ALT: 16 IU/L (ref 0–32)
AST: 24 IU/L (ref 0–40)
Albumin: 4.4 g/dL (ref 3.6–4.6)
Alkaline Phosphatase: 55 IU/L (ref 48–129)
BUN/Creatinine Ratio: 18 (ref 12–28)
BUN: 18 mg/dL (ref 10–36)
Bilirubin Total: 0.3 mg/dL (ref 0.0–1.2)
CO2: 26 mmol/L (ref 20–29)
Calcium: 9.7 mg/dL (ref 8.7–10.3)
Chloride: 104 mmol/L (ref 96–106)
Creatinine, Ser: 1 mg/dL (ref 0.57–1.00)
Globulin, Total: 2.2 g/dL (ref 1.5–4.5)
Glucose: 90 mg/dL (ref 70–99)
Potassium: 3.9 mmol/L (ref 3.5–5.2)
Sodium: 144 mmol/L (ref 134–144)
Total Protein: 6.6 g/dL (ref 6.0–8.5)
eGFR: 53 mL/min/1.73 — ABNORMAL LOW

## 2025-01-02 LAB — URINALYSIS, ROUTINE W REFLEX MICROSCOPIC
Bilirubin, UA: NEGATIVE
Glucose, UA: NEGATIVE
Ketones, UA: NEGATIVE
Nitrite, UA: NEGATIVE
RBC, UA: NEGATIVE
Specific Gravity, UA: 1.021 (ref 1.005–1.030)
Urobilinogen, Ur: 1 mg/dL (ref 0.2–1.0)
pH, UA: 6.5 (ref 5.0–7.5)

## 2025-01-02 LAB — FOLATE: Folate: >20 ng/mL

## 2025-01-02 LAB — LIPID PANEL
Chol/HDL Ratio: 3.3 ratio (ref 0.0–4.4)
Cholesterol, Total: 154 mg/dL (ref 100–199)
HDL: 47 mg/dL
LDL Chol Calc (NIH): 64 mg/dL (ref 0–99)
Triglycerides: 273 mg/dL — ABNORMAL HIGH (ref 0–149)
VLDL Cholesterol Cal: 43 mg/dL — ABNORMAL HIGH (ref 5–40)

## 2025-01-02 LAB — MICROSCOPIC EXAMINATION
Casts: NONE SEEN /LPF
Epithelial Cells (non renal): >10 /HPF — AB (ref 0–10)
RBC, Urine: NONE SEEN /HPF (ref 0–2)
WBC, UA: >30 /HPF — AB (ref 0–5)

## 2025-01-02 LAB — CBC
Hematocrit: 41.8 % (ref 34.0–46.6)
Hemoglobin: 13.9 g/dL (ref 11.1–15.9)
MCH: 32.3 pg (ref 26.6–33.0)
MCHC: 33.3 g/dL (ref 31.5–35.7)
MCV: 97 fL (ref 79–97)
Platelets: 207 x10E3/uL (ref 150–450)
RBC: 4.31 x10E6/uL (ref 3.77–5.28)
RDW: 13.4 % (ref 11.7–15.4)
WBC: 6.5 x10E3/uL (ref 3.4–10.8)

## 2025-01-02 LAB — VITAMIN B12: Vitamin B-12: 704 pg/mL (ref 232–1245)

## 2025-01-02 LAB — TSH+FREE T4
Free T4: 0.91 ng/dL (ref 0.82–1.77)
TSH: 1.73 u[IU]/mL (ref 0.450–4.500)

## 2025-01-02 LAB — HEMOGLOBIN A1C
Est. average glucose Bld gHb Est-mCnc: 111 mg/dL
Hgb A1c MFr Bld: 5.5 % (ref 4.8–5.6)

## 2025-01-03 ENCOUNTER — Ambulatory Visit: Payer: Self-pay | Admitting: Family Medicine

## 2025-01-03 LAB — URINE CULTURE

## 2025-01-03 LAB — SPECIMEN STATUS REPORT

## 2025-01-07 ENCOUNTER — Ambulatory Visit
Admission: RE | Admit: 2025-01-07 | Discharge: 2025-01-07 | Disposition: A | Discharge status: Home / Self Care | Source: Ambulatory Visit | Attending: Family Medicine | Admitting: Family Medicine

## 2025-01-07 DIAGNOSIS — N3941 Urge incontinence: Secondary | ICD-10-CM | POA: Diagnosis present

## 2025-01-07 DIAGNOSIS — R2689 Other abnormalities of gait and mobility: Secondary | ICD-10-CM | POA: Diagnosis present

## 2025-02-05 ENCOUNTER — Encounter

## 2025-02-07 ENCOUNTER — Encounter

## 2025-04-14 ENCOUNTER — Ambulatory Visit: Admitting: Family Medicine

## 2025-10-29 ENCOUNTER — Ambulatory Visit
